# Patient Record
Sex: Female | Born: 1975
Health system: Southern US, Community
[De-identification: ages and names within clinical notes are randomized; demographics above are authoritative.]

## PROBLEM LIST (undated history)

## (undated) DIAGNOSIS — E785 Hyperlipidemia, unspecified: Secondary | ICD-10-CM

## (undated) DIAGNOSIS — K219 Gastro-esophageal reflux disease without esophagitis: Secondary | ICD-10-CM

## (undated) DIAGNOSIS — F419 Anxiety disorder, unspecified: Secondary | ICD-10-CM

## (undated) DIAGNOSIS — E7800 Pure hypercholesterolemia, unspecified: Secondary | ICD-10-CM

## (undated) DIAGNOSIS — IMO0002 Reserved for concepts with insufficient information to code with codable children: Secondary | ICD-10-CM

## (undated) DIAGNOSIS — K8020 Calculus of gallbladder without cholecystitis without obstruction: Secondary | ICD-10-CM

## (undated) DIAGNOSIS — G43109 Migraine with aura, not intractable, without status migrainosus: Secondary | ICD-10-CM

## (undated) HISTORY — DX: Calculus of gallbladder without cholecystitis without obstruction: K80.20

## (undated) HISTORY — DX: Reserved for concepts with insufficient information to code with codable children: IMO0002

## (undated) HISTORY — DX: Hyperlipidemia, unspecified: E78.5

## (undated) HISTORY — PX: BREAST SURGERY: SHX581

## (undated) HISTORY — DX: Anxiety disorder, unspecified: F41.9

## (undated) HISTORY — DX: Pure hypercholesterolemia, unspecified: E78.00

## (undated) HISTORY — DX: Migraine with aura, not intractable, without status migrainosus: G43.109

## (undated) HISTORY — DX: Gastro-esophageal reflux disease without esophagitis: K21.9

---

## 2001-12-27 ENCOUNTER — Other Ambulatory Visit: Admission: RE | Admit: 2001-12-27 | Discharge: 2001-12-27 | Payer: Self-pay | Admitting: Obstetrics and Gynecology

## 2003-01-20 ENCOUNTER — Other Ambulatory Visit: Admission: RE | Admit: 2003-01-20 | Discharge: 2003-01-20 | Payer: Self-pay | Admitting: Obstetrics and Gynecology

## 2003-02-17 DIAGNOSIS — R87619 Unspecified abnormal cytological findings in specimens from cervix uteri: Secondary | ICD-10-CM

## 2003-02-17 DIAGNOSIS — IMO0002 Reserved for concepts with insufficient information to code with codable children: Secondary | ICD-10-CM

## 2003-02-17 HISTORY — DX: Unspecified abnormal cytological findings in specimens from cervix uteri: R87.619

## 2003-02-17 HISTORY — DX: Reserved for concepts with insufficient information to code with codable children: IMO0002

## 2003-02-17 HISTORY — PX: CERVICAL BIOPSY  W/ LOOP ELECTRODE EXCISION: SUR135

## 2003-06-09 ENCOUNTER — Other Ambulatory Visit: Admission: RE | Admit: 2003-06-09 | Discharge: 2003-06-09 | Payer: Self-pay | Admitting: Obstetrics and Gynecology

## 2003-09-08 ENCOUNTER — Other Ambulatory Visit: Admission: RE | Admit: 2003-09-08 | Discharge: 2003-09-08 | Payer: Self-pay | Admitting: Obstetrics and Gynecology

## 2003-12-08 ENCOUNTER — Other Ambulatory Visit: Admission: RE | Admit: 2003-12-08 | Discharge: 2003-12-08 | Payer: Self-pay | Admitting: Obstetrics and Gynecology

## 2004-02-24 ENCOUNTER — Other Ambulatory Visit: Admission: RE | Admit: 2004-02-24 | Discharge: 2004-02-24 | Payer: Self-pay | Admitting: Obstetrics and Gynecology

## 2005-11-13 ENCOUNTER — Other Ambulatory Visit: Admission: RE | Admit: 2005-11-13 | Discharge: 2005-11-13 | Payer: Self-pay | Admitting: *Deleted

## 2007-09-19 HISTORY — PX: COSMETIC SURGERY: SHX468

## 2008-06-22 ENCOUNTER — Other Ambulatory Visit: Admission: RE | Admit: 2008-06-22 | Discharge: 2008-06-22 | Payer: Self-pay | Admitting: Obstetrics and Gynecology

## 2013-01-06 ENCOUNTER — Ambulatory Visit (INDEPENDENT_AMBULATORY_CARE_PROVIDER_SITE_OTHER): Payer: 59 | Admitting: Nurse Practitioner

## 2013-01-06 ENCOUNTER — Encounter: Payer: Self-pay | Admitting: *Deleted

## 2013-01-06 ENCOUNTER — Encounter: Payer: Self-pay | Admitting: Nurse Practitioner

## 2013-01-06 VITALS — BP 118/76 | HR 82 | Resp 16 | Ht 67.5 in | Wt 221.0 lb

## 2013-01-06 DIAGNOSIS — Z0000 Encounter for general adult medical examination without abnormal findings: Secondary | ICD-10-CM

## 2013-01-06 DIAGNOSIS — Z8742 Personal history of other diseases of the female genital tract: Secondary | ICD-10-CM

## 2013-01-06 DIAGNOSIS — Z01419 Encounter for gynecological examination (general) (routine) without abnormal findings: Secondary | ICD-10-CM

## 2013-01-06 LAB — POCT URINALYSIS DIPSTICK
Leukocytes, UA: NEGATIVE
Urobilinogen, UA: NEGATIVE

## 2013-01-06 MED ORDER — NORGESTIMATE-ETH ESTRADIOL 0.25-35 MG-MCG PO TABS: 1.0000 | ORAL_TABLET | Freq: Every day | ORAL | Status: DC

## 2013-01-06 NOTE — Patient Instructions (Addendum)

## 2013-01-06 NOTE — Progress Notes (Signed)
37 y.o.Married Caucasian Fe here for annual exam.  Menses on Ortho cyclen is regular but has more cramps. Takes OTC NSAID'S with relief. No other vaso symptoms.  Pt and husband celebrated 3 year anniversary.  They are going to [place] in October. No LMP recorded.          Sexually active: yes  The current method of family planning is OCP (estrogen/progesterone).    Exercising: yes  Home exercise routine includes walking 4 hrs per week. Smoker:  no  Health Maintenance: Pap:  01/05/2012 normal (history of CIN 2/3 2004- pap yearly X 20) MMG:  never TDaP:  2008 Labs: Hgb-      Past Medical History  Diagnosis Date  . Abnormal pap 02/2003    CIN 2-3    Past Surgical History  Procedure Laterality Date  . Cervical biopsy  w/ loop electrode excision  02/2003    CIN 2-3    Current Outpatient Prescriptions  Medication Sig Dispense Refill  . Multiple Vitamins-Minerals (MULTIVITAMIN PO) Take by mouth daily.      . Norgestimate-Ethinyl Estradiol Triphasic (ORTHO TRI-CYCLEN, 28,) 0.18/0.215/0.25 MG-35 MCG tablet Take 1 tablet by mouth daily.       No current facility-administered medications for this visit.    No family history on file.  ROS:  Pertinent items are noted in HPI.  Otherwise, a comprehensive ROS was negative.  Exam:   There were no vitals taken for this visit.     Ht Readings from Last 3 Encounters:  No data found for Ht    General appearance: alert, cooperative and appears stated age Head: Normocephalic, without obvious abnormality, atraumatic Neck: no adenopathy, supple, symmetrical, trachea midline and thyroid normal to inspection and palpation Lungs: clear to auscultation bilaterally Breasts: normal appearance, no masses or tenderness Heart: regular rate and rhythm Abdomen: soft, non-tender; no masses,  no organomegaly Extremities: extremities normal, atraumatic, no cyanosis or edema Skin: Skin color, texture, turgor normal. No rashes or lesions Lymph nodes:  Cervical, supraclavicular, and axillary nodes normal. No abnormal inguinal nodes palpated Neurologic: Grossly normal   Pelvic: External genitalia:  no lesions              Urethra:  normal appearing urethra with no masses, tenderness or lesions              Bartholin's and Skene's: normal                 Vagina: normal appearing vagina with normal color and discharge, no lesions              Cervix: anteverted              Pap taken: yes also HR HPV Bimanual Exam:  Uterus:  normal size, contour, position, consistency, mobility, non-tender              Adnexa: no mass, fullness, tenderness               Rectovaginal: Confirms               Anus:  normal sphincter tone, no lesions  A:  Well Woman with normal exam  No contraindications to continued OCP use  History of CIN 2 & 3 with LEEP 2004  P:   pap smear yearly per guidelines  RF OCP printed given to her as she has mail in Pharmacy and unsure where  to send  return annually or prn  An After Visit Summary was printed and  given to the patient.

## 2013-01-07 LAB — HEMOGLOBIN, FINGERSTICK: Hemoglobin, fingerstick: 14 g/dL (ref 12.0–16.0)

## 2013-01-07 NOTE — Progress Notes (Signed)
Encounter reviewed by Dr. Emme Rosenau Silva.  

## 2013-01-09 NOTE — Progress Notes (Signed)
Pt has aex 01/08/2014

## 2013-09-18 HISTORY — PX: CHOLECYSTECTOMY: SHX55

## 2014-01-08 ENCOUNTER — Encounter: Payer: Self-pay | Admitting: Nurse Practitioner

## 2014-01-08 ENCOUNTER — Ambulatory Visit (INDEPENDENT_AMBULATORY_CARE_PROVIDER_SITE_OTHER): Payer: 59 | Admitting: Nurse Practitioner

## 2014-01-08 VITALS — BP 130/86 | HR 72 | Temp 98.9°F | Ht 67.0 in | Wt 216.0 lb

## 2014-01-08 DIAGNOSIS — Z0000 Encounter for general adult medical examination without abnormal findings: Secondary | ICD-10-CM

## 2014-01-08 DIAGNOSIS — Z01419 Encounter for gynecological examination (general) (routine) without abnormal findings: Secondary | ICD-10-CM

## 2014-01-08 LAB — POCT URINALYSIS DIPSTICK
BILIRUBIN UA: NEGATIVE
Blood, UA: NEGATIVE
GLUCOSE UA: NEGATIVE
Ketones, UA: NEGATIVE
LEUKOCYTES UA: NEGATIVE
NITRITE UA: NEGATIVE
Protein, UA: NEGATIVE
UROBILINOGEN UA: NEGATIVE
pH, UA: 5

## 2014-01-08 LAB — HEMOGLOBIN, FINGERSTICK: HEMOGLOBIN, FINGERSTICK: 14.2 g/dL (ref 12.0–16.0)

## 2014-01-08 MED ORDER — NORGESTIMATE-ETH ESTRADIOL 0.25-35 MG-MCG PO TABS: 1.0000 | ORAL_TABLET | Freq: Every day | ORAL | Status: DC

## 2014-01-08 MED ORDER — DESOXIMETASONE 0.25 % EX CREA: TOPICAL_CREAM | CUTANEOUS | Status: DC

## 2014-01-08 NOTE — Progress Notes (Signed)
Patient ID: Susan [name], female   DOB: 28-Sep-1975, 38 y.o.   MRN: [id number] 38 y.o. G0P0 Married Caucasian Fe here for annual exam.  Menses still regular and lasting 3-4 days.  Moderate to light.  No cramps.   She and husband are still enjoying traveling.  He is from [place] and loves to go places and sample wine and fine dining.  Patient's last menstrual period was 12/24/2013.          Sexually active: yes  The current method of family planning is OCP (estrogen/progesterone).    Exercising: yes  cardio 3-4 times per week Smoker:  no  Health Maintenance: Pap:  01/06/13, WNL, neg HR HPV (history of CIN 2/3 2004- pap yearly X 20) TDaP:  05/2013 Labs:  HB: 14.2  Urine:  Negative    reports that she has never smoked. She has never used smokeless tobacco. She reports that she drinks about .6 - 1.2 ounces of alcohol per week. She reports that she does not use illicit drugs.  Past Medical History  Diagnosis Date  . Abnormal pap 02/2003    CIN 2-3    Past Surgical History  Procedure Laterality Date  . Cervical biopsy  w/ loop electrode excision  02/2003    CIN 2-3    Current Outpatient Prescriptions  Medication Sig Dispense Refill  . HYDROcodone-acetaminophen (NORCO) 7.5-325 MG per tablet Take 1 tablet by mouth as needed.      Marland Kitchen ibuprofen (ADVIL,MOTRIN) 800 MG tablet Take 1 tablet by mouth as needed.      . Multiple Vitamins-Minerals (MULTIVITAMIN PO) Take by mouth daily.      . norgestimate-ethinyl estradiol (ORTHO-CYCLEN, 28,) 0.25-35 MG-MCG tablet Take 1 tablet by mouth daily.  3 Package  3  . desoximetasone (TOPICORT) 0.25 % cream Use 1 X daily prn. But not long term  60 g  2   No current facility-administered medications for this visit.    Family History  Problem Relation Age of Onset  . Hypertension Father   . Hypertension Maternal Grandmother   . Diabetes Maternal Grandmother   . Heart failure Maternal Grandfather   . Diabetes Paternal Grandmother   . HIV Mother   .  Other Mother     ROS:  Pertinent items are noted in HPI.  Otherwise, a comprehensive ROS was negative.  Exam:   BP 130/86  Pulse 72  Temp(Src) 98.9 F (37.2 C) (Oral)  Ht 5\' 7"  (1.702 m)  Wt 216 lb (97.977 kg)  BMI 33.82 kg/m2  LMP 12/24/2013 Height: 5\' 7"  (170.2 cm)  Ht Readings from Last 3 Encounters:  01/08/14 5\' 7"  (1.702 m)  01/06/13 5' 7.5" (1.715 m)    General appearance: alert, cooperative and appears stated age Head: Normocephalic, without obvious abnormality, atraumatic Neck: no adenopathy, supple, symmetrical, trachea midline and thyroid normal to inspection and palpation Lungs: clear to auscultation bilaterally Breasts: normal appearance, no masses or tenderness Heart: regular rate and rhythm Abdomen: soft, non-tender; no masses,  no organomegaly Extremities: extremities normal, atraumatic, no cyanosis or edema Skin: Skin color, texture, turgor normal. No rashes or lesions Lymph nodes: Cervical, supraclavicular, and axillary nodes normal. No abnormal inguinal nodes palpated Neurologic: Grossly normal   Pelvic: External genitalia:  no lesions              Urethra:  normal appearing urethra with no masses, tenderness or lesions              Bartholin's and Skene's: normal  Vagina: normal appearing vagina with normal color and discharge, no lesions              Cervix: anteverted              Pap taken: yes Bimanual Exam:  Uterus:  normal size, contour, position, consistency, mobility, non-tender              Adnexa: no mass, fullness, tenderness               Rectovaginal: Confirms               Anus:  normal sphincter tone, no lesions  A:  Well Woman with normal exam  Contraception with OCP  History of CIN II & III 02/2003  P:   Reviewed health and wellness pertinent to exam  Pap smear taken today and yearly for 20 years  Mammogram screening  Refill OCP for a year  Also refill of Topicort for eczema of elbow  Counseled on breast self exam,  mammography screening, use and side effects of OCP's, adequate intake of calcium and vitamin D, diet and exercise return annually or prn  An After Visit Summary was printed and given to the patient.

## 2014-01-08 NOTE — Patient Instructions (Signed)

## 2014-01-11 NOTE — Progress Notes (Signed)
Encounter reviewed by Dr. Brook Silva.  

## 2014-01-13 LAB — IPS PAP TEST WITH HPV

## 2014-07-03 ENCOUNTER — Other Ambulatory Visit: Payer: Self-pay

## 2014-11-06 ENCOUNTER — Other Ambulatory Visit: Payer: Self-pay | Admitting: Nurse Practitioner

## 2014-11-06 NOTE — Telephone Encounter (Signed)
Medication refill request: Ortho-Cyclen Last AEX:  01/08/14 with PG  Next AEX: 01/11/15 with PG Last MMG (if hormonal medication request): N/A Refill authorized: #[id number] rfs, please advise.

## 2015-01-11 ENCOUNTER — Ambulatory Visit: Payer: 59 | Admitting: Nurse Practitioner

## 2016-02-24 DIAGNOSIS — Z01419 Encounter for gynecological examination (general) (routine) without abnormal findings: Secondary | ICD-10-CM | POA: Diagnosis not present

## 2016-02-24 DIAGNOSIS — E7800 Pure hypercholesterolemia, unspecified: Secondary | ICD-10-CM | POA: Diagnosis not present

## 2016-02-24 DIAGNOSIS — R030 Elevated blood-pressure reading, without diagnosis of hypertension: Secondary | ICD-10-CM | POA: Insufficient documentation

## 2016-02-24 DIAGNOSIS — Z1231 Encounter for screening mammogram for malignant neoplasm of breast: Secondary | ICD-10-CM | POA: Diagnosis not present

## 2016-02-24 DIAGNOSIS — E669 Obesity, unspecified: Secondary | ICD-10-CM | POA: Diagnosis not present

## 2016-02-24 DIAGNOSIS — Z87410 Personal history of cervical dysplasia: Secondary | ICD-10-CM | POA: Insufficient documentation

## 2016-02-24 DIAGNOSIS — R7303 Prediabetes: Secondary | ICD-10-CM | POA: Diagnosis not present

## 2017-04-25 ENCOUNTER — Other Ambulatory Visit: Payer: Self-pay | Admitting: Obstetrics and Gynecology

## 2017-04-25 DIAGNOSIS — Z1231 Encounter for screening mammogram for malignant neoplasm of breast: Secondary | ICD-10-CM

## 2017-05-28 ENCOUNTER — Encounter: Payer: Self-pay | Admitting: Obstetrics and Gynecology

## 2017-05-28 ENCOUNTER — Ambulatory Visit: Payer: Self-pay

## 2017-06-11 ENCOUNTER — Encounter: Payer: Self-pay | Admitting: Obstetrics and Gynecology

## 2017-06-11 ENCOUNTER — Ambulatory Visit
Admission: RE | Admit: 2017-06-11 | Discharge: 2017-06-11 | Disposition: A | Discharge status: Home / Self Care | Payer: BLUE CROSS/BLUE SHIELD | Source: Ambulatory Visit | Attending: Obstetrics and Gynecology | Admitting: Obstetrics and Gynecology

## 2017-06-11 ENCOUNTER — Ambulatory Visit (INDEPENDENT_AMBULATORY_CARE_PROVIDER_SITE_OTHER): Payer: BLUE CROSS/BLUE SHIELD | Admitting: Obstetrics and Gynecology

## 2017-06-11 ENCOUNTER — Other Ambulatory Visit (HOSPITAL_COMMUNITY)
Admission: RE | Admit: 2017-06-11 | Discharge: 2017-06-11 | Disposition: A | Discharge status: Home / Self Care | Payer: BLUE CROSS/BLUE SHIELD | Source: Ambulatory Visit | Attending: Obstetrics and Gynecology | Admitting: Obstetrics and Gynecology

## 2017-06-11 VITALS — BP 118/70 | HR 76 | Resp 16 | Ht 67.0 in | Wt 212.0 lb

## 2017-06-11 DIAGNOSIS — Z3009 Encounter for other general counseling and advice on contraception: Secondary | ICD-10-CM | POA: Diagnosis not present

## 2017-06-11 DIAGNOSIS — Z124 Encounter for screening for malignant neoplasm of cervix: Secondary | ICD-10-CM

## 2017-06-11 DIAGNOSIS — Z1231 Encounter for screening mammogram for malignant neoplasm of breast: Secondary | ICD-10-CM

## 2017-06-11 DIAGNOSIS — Z01419 Encounter for gynecological examination (general) (routine) without abnormal findings: Secondary | ICD-10-CM

## 2017-06-11 MED ORDER — DESOXIMETASONE 0.25 % EX CREA: TOPICAL_CREAM | CUTANEOUS | 2 refills | Status: DC

## 2017-06-11 NOTE — Patient Instructions (Signed)
EXERCISE AND DIET:  We recommended that you start or continue a regular exercise program for good health. Regular exercise means any activity that makes your heart beat faster and makes you sweat.  We recommend exercising at least 30 minutes per day at least 3 days a week, preferably 4 or 5.  We also recommend a diet low in fat and sugar.  Inactivity, poor dietary choices and obesity can cause diabetes, heart attack, stroke, and kidney damage, among others.    ALCOHOL AND SMOKING:  Women should limit their alcohol intake to no more than 7 drinks/beers/glasses of wine (combined, not each!) per week. Moderation of alcohol intake to this level decreases your risk of breast cancer and liver damage. And of course, no recreational drugs are part of a healthy lifestyle.  And absolutely no smoking or even second hand smoke. Most people know smoking can cause heart and lung diseases, but did you know it also contributes to weakening of your bones? Aging of your skin?  Yellowing of your teeth and nails?  CALCIUM AND VITAMIN D:  Adequate intake of calcium and Vitamin D are recommended.  The recommendations for exact amounts of these supplements seem to change often, but generally speaking 600 mg of calcium (either carbonate or citrate) and 800 units of Vitamin D per day seems prudent. Certain women may benefit from higher intake of Vitamin D.  If you are among these women, your doctor will have told you during your visit.    PAP SMEARS:  Pap smears, to check for cervical cancer or precancers,  have traditionally been done yearly, although recent scientific advances have shown that most women can have pap smears less often.  However, every woman still should have a physical exam from her gynecologist every year. It will include a breast check, inspection of the vulva and vagina to check for abnormal growths or skin changes, a visual exam of the cervix, and then an exam to evaluate the size and shape of the uterus and  ovaries.  And after 42 years of age, a rectal exam is indicated to check for rectal cancers. We will also provide age appropriate advice regarding health maintenance, like when you should have certain vaccines, screening for sexually transmitted diseases, bone density testing, colonoscopy, mammograms, etc.   MAMMOGRAMS:  All women over 42 years old should have a yearly mammogram. Many facilities now offer a "3D" mammogram, which may cost around $50 extra out of pocket. If possible,  we recommend you accept the option to have the 3D mammogram performed.  It both reduces the number of women who will be called back for extra views which then turn out to be normal, and it is better than the routine mammogram at detecting truly abnormal areas.    COLONOSCOPY:  Colonoscopy to screen for colon cancer is recommended for all women at age 42.  We know, you hate the idea of the prep.  We agree, BUT, having colon cancer and not knowing it is worse!!  Colon cancer so often starts as a polyp that can be seen and removed at colonscopy, which can quite literally save your life!  And if your first colonoscopy is normal and you have no family history of colon cancer, most women don't have to have it again for 10 years.  Once every ten years, you can do something that may end up saving your life, right?  We will be happy to help you get it scheduled when you are ready.    Be sure to check your insurance coverage so you understand how much it will cost.  It may be covered as a preventative service at no cost, but you should check your particular policy.      Breast Self-Awareness Breast self-awareness means being familiar with how your breasts look and feel. It involves checking your breasts regularly and reporting any changes to your health care provider. Practicing breast self-awareness is important. A change in your breasts can be a sign of a serious medical problem. Being familiar with how your breasts look and feel allows  you to find any problems early, when treatment is more likely to be successful. All women should practice breast self-awareness, including women who have had breast implants. How to do a breast self-exam One way to learn what is normal for your breasts and whether your breasts are changing is to do a breast self-exam. To do a breast self-exam: Look for Changes  1. Remove all the clothing above your waist. 2. Stand in front of a mirror in a room with good lighting. 3. Put your hands on your hips. 4. Push your hands firmly downward. 5. Compare your breasts in the mirror. Look for differences between them (asymmetry), such as: ? Differences in shape. ? Differences in size. ? Puckers, dips, and bumps in one breast and not the other. 6. Look at each breast for changes in your skin, such as: ? Redness. ? Scaly areas. 7. Look for changes in your nipples, such as: ? Discharge. ? Bleeding. ? Dimpling. ? Redness. ? A change in position. Feel for Changes  Carefully feel your breasts for lumps and changes. It is best to do this while lying on your back on the floor and again while sitting or standing in the shower or tub with soapy water on your skin. Feel each breast in the following way:  Place the arm on the side of the breast you are examining above your head.  Feel your breast with the other hand.  Start in the nipple area and make  inch (2 cm) overlapping circles to feel your breast. Use the pads of your three middle fingers to do this. Apply light pressure, then medium pressure, then firm pressure. The light pressure will allow you to feel the tissue closest to the skin. The medium pressure will allow you to feel the tissue that is a little deeper. The firm pressure will allow you to feel the tissue close to the ribs.  Continue the overlapping circles, moving downward over the breast until you feel your ribs below your breast.  Move one finger-width toward the center of the body.  Continue to use the  inch (2 cm) overlapping circles to feel your breast as you move slowly up toward your collarbone.  Continue the up and down exam using all three pressures until you reach your armpit.  Write Down What You Find  Write down what is normal for each breast and any changes that you find. Keep a written record with breast changes or normal findings for each breast. By writing this information down, you do not need to depend only on memory for size, tenderness, or location. Write down where you are in your menstrual cycle, if you are still menstruating. If you are having trouble noticing differences in your breasts, do not get discouraged. With time you will become more familiar with the variations in your breasts and more comfortable with the exam. How often should I examine my breasts? Examine   your breasts every month. If you are breastfeeding, the best time to examine your breasts is after a feeding or after using a breast pump. If you menstruate, the best time to examine your breasts is 5-7 days after your period is over. During your period, your breasts are lumpier, and it may be more difficult to notice changes. When should I see my health care provider? See your health care provider if you notice:  A change in shape or size of your breasts or nipples.  A change in the skin of your breast or nipples, such as a reddened or scaly area.  Unusual discharge from your nipples.  A lump or thick area that was not there before.  Pain in your breasts.  Anything that concerns you.  This information is not intended to replace advice given to you by your health care provider. Make sure you discuss any questions you have with your health care provider. Document Released: 09/04/2005 Document Revised: 02/10/2016 Document Reviewed: 07/25/2015 Elsevier Interactive Patient Education  2018 Elsevier Inc.  

## 2017-06-11 NOTE — Progress Notes (Signed)
41 y.o. G0P0 MarriedCaucasianF here for annual exam.  She ran out of OCP's in August. Her first cycle off the pill was okay. She reports a h/o migraines with Aura. In the past she had bad cramps, this first cycle without OCP's was mildly crampy. Sexually active without pain.  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 [place]  Period Cycle (Days): 21 Period Duration (Days): 7 days Period Pattern: Regular Menstrual Flow: Light Menstrual Control: Tampon Menstrual Control Change Freq (Hours): changes tampon 2-3 times a day  Dysmenorrhea: None  Patient's last menstrual period was 06/03/2017.          Sexually active: Yes.    The current method of family planning is none.    Exercising: Yes.    walking Smoker:  no  Health Maintenance: Pap:  01-08-14 WNL NEG HR HPV History of abnormal Pap:  Yes- HPV- LEEP 2004 CIN 2-3 MMG:  06-11-17 -waiting for results Colonoscopy:  Never BMD:   Never TDaP:  Had one at work about 4 years ago.   Gardasil: N/A   reports that she has never smoked. She has never used smokeless tobacco. She reports that she drinks about 0.6 - 1.2 oz of alcohol per week . She reports that she does not use drugs. 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 Husband is from [place], moved to the [place] as a child.   Past Medical History:  Diagnosis Date  . Abnormal pap 02/2003   CIN 2-3  . Migraine with aura     Past Surgical History:  Procedure Laterality Date  . CERVICAL BIOPSY  W/ LOOP ELECTRODE EXCISION  02/2003   CIN 2-3  . CHOLECYSTECTOMY  2015    Current Outpatient Prescriptions  Medication Sig Dispense Refill  . desoximetasone (TOPICORT) 0.25 % cream Use 1 X daily prn. But not long term 30 g 2  . ibuprofen (ADVIL,MOTRIN) 800 MG tablet Take 1 tablet by mouth as needed.    . Multiple Vitamins-Minerals (MULTIVITAMIN PO) Take by mouth daily.     No current facility-administered medications for this visit.     Family History  Problem  Relation Age of Onset  . Hypertension Father   . Hypertension Maternal Grandmother   . Diabetes Maternal Grandmother   . Heart failure Maternal Grandfather   . Diabetes Paternal Grandmother   . HIV Mother   . Other Mother   Mom died when she was 73. Brother died in Feb 19, 2044, thinks accidental OD.   Review of Systems  Constitutional: Negative.   HENT: Negative.   Eyes: Negative.   Respiratory: Negative.   Cardiovascular: Negative.   Gastrointestinal: Negative.   Endocrine: Negative.   Genitourinary: Negative.   Musculoskeletal: Negative.   Skin: Negative.   Allergic/Immunologic: Negative.   Neurological: Negative.   Psychiatric/Behavioral: Negative.     Exam:   BP 118/70 (BP Location: Right Arm, Patient Position: Sitting, Cuff Size: Normal)   Pulse 76   Resp 16   Ht 5\' 7"  (1.702 m)   Wt 212 lb (96.2 kg)   LMP 06/03/2017   BMI 33.20 kg/m   Weight change: @WEIGHTCHANGE @ Height:   Height: 5\' 7"  (170.2 cm)  Ht Readings from Last 3 Encounters:  06/11/17 5\' 7"  (1.702 m)  01/08/14 5\' 7"  (1.702 m)  01/06/13 5' 7.5" (1.715 m)    General appearance: alert, cooperative and appears stated age Head: Normocephalic, without obvious abnormality, atraumatic Neck: no adenopathy, supple, symmetrical,  trachea midline and thyroid normal to inspection and palpation Lungs: clear to auscultation bilaterally Cardiovascular: regular rate and rhythm Breasts: normal appearance, no masses or tenderness Abdomen: soft, non-tender; non distended,  no masses,  no organomegaly Extremities: extremities normal, atraumatic, no cyanosis or edema Skin: Skin color, texture, turgor normal. No rashes or lesions Lymph nodes: Cervical, supraclavicular, and axillary nodes normal. No abnormal inguinal nodes palpated Neurologic: Grossly normal   Pelvic: External genitalia:  no lesions              Urethra:  normal appearing urethra with no masses, tenderness or lesions              Bartholins and Skenes:  normal                 Vagina: normal appearing vagina with normal color and discharge, no lesions              Cervix: no lesions               Bimanual Exam:  Uterus:  normal size, contour, position, consistency, mobility, non-tender and retroverted              Adnexa: no mass, fullness, tenderness               Rectovaginal: Confirms               Anus:  normal sphincter tone, no lesions  Chaperone was present for exam.  A:  Well Woman with normal exam  P:   Pap with hpv  Screening labs at work  Mammogram just done  Discussed breast self exam  Discussed calcium and vit D intake  Discussed option of contraception, information on Mirena given (can't take OCP'S)

## 2017-06-13 LAB — CYTOLOGY - PAP
Diagnosis: NEGATIVE
HPV: NOT DETECTED

## 2017-06-15 LAB — LIPID PANEL
CHOLESTEROL: 258 — AB (ref 0–200)
HDL: 47 (ref 35–70)
LDL Cholesterol: 43
TRIGLYCERIDES: 214 — AB (ref 40–160)

## 2017-06-15 LAB — BASIC METABOLIC PANEL
BUN: 11 (ref 4–21)
Creatinine: 0.9 (ref 0.5–1.1)
POTASSIUM: 4.5 (ref 3.4–5.3)
Sodium: 142 (ref 137–147)

## 2017-06-15 LAB — HEMOGLOBIN A1C: Hemoglobin A1C: 5.6

## 2017-07-23 ENCOUNTER — Telehealth: Payer: Self-pay

## 2017-07-23 ENCOUNTER — Encounter: Payer: Self-pay | Admitting: Obstetrics and Gynecology

## 2017-07-23 DIAGNOSIS — J018 Other acute sinusitis: Secondary | ICD-10-CM | POA: Diagnosis not present

## 2017-07-23 NOTE — Telephone Encounter (Signed)
Visit Follow-Up Question  Message [id number]  From Alliana, Mcauliff To Salvadore Dom, MD Sent 07/23/2017 10:03 AM  Dr. Talbert Nan asked that I send my results from my company's clinical health assessment (wellness program). The nurse that provided my results was a little concerned about my cholesterol. I have attached the results. I do not have a primary doctor. Do I need to find a primary or can Dr. Talbert Nan assist?  Thank you!   Attachments   J.Lyles [facility] 2018  Responsible Party   Pool - [facility] Clinical Pool No one has taken responsibility for this message.  No actions have been taken on this message.   Results printed and to [place] for review and advise.

## 2017-07-23 NOTE — Telephone Encounter (Signed)
Please see telephone encounter dated with today's date. 

## 2017-07-25 NOTE — Telephone Encounter (Signed)
I've reviewed her labs. Her total cholesterol, triglycerides, her LDL and her ALT (liver function) were all mildly elevated. I would recommend that she establish care with a primary. Please give her suggestions if needed.  I would recommend she eat a mediterranean diet.  A mediterranean diet is high in fruits, vegetables, whole grains, fish, chicken, nuts, healthy fats (olive oil or canola oil). Low fat dairy. Limit butter, margarine, red meat and sweets.  She should exercise regularly. Weight loss would help her labs normalize.

## 2017-07-26 NOTE — Telephone Encounter (Signed)
Spoke with patient. Advised of message as seen below from [place]. Patient verbalizes understanding. Contact information given to Dr.Wallace's office. Patient will call and schedule an appointment to establish care.  [place] [facility] [place], [place]  [place]: [phone]  Routing to provider for final review. Patient agreeable to disposition. Will close encounter.

## 2017-08-17 ENCOUNTER — Other Ambulatory Visit: Payer: Self-pay

## 2017-08-17 ENCOUNTER — Encounter: Payer: Self-pay | Admitting: Family Medicine

## 2017-08-17 ENCOUNTER — Ambulatory Visit (INDEPENDENT_AMBULATORY_CARE_PROVIDER_SITE_OTHER): Payer: BLUE CROSS/BLUE SHIELD | Admitting: Family Medicine

## 2017-08-17 VITALS — BP 122/84 | HR 83 | Temp 98.1°F | Ht 67.0 in | Wt 218.0 lb

## 2017-08-17 DIAGNOSIS — E782 Mixed hyperlipidemia: Secondary | ICD-10-CM

## 2017-08-17 DIAGNOSIS — E669 Obesity, unspecified: Secondary | ICD-10-CM

## 2017-08-17 DIAGNOSIS — R635 Abnormal weight gain: Secondary | ICD-10-CM | POA: Diagnosis not present

## 2017-08-17 MED ORDER — PHENTERMINE HCL 37.5 MG PO TABS: 37.5000 mg | ORAL_TABLET | Freq: Every day | ORAL | 2 refills | Status: DC

## 2017-08-17 NOTE — Progress Notes (Signed)
Susan [name] is a 41 y.o. female is here to Susan [name].   Patient Care Team: Susan Deutscher, DO as PCP - General (Family Medicine)   History of Present Illness:   HPI: See Assessment and Plan section for Problem Based Charting of issues discussed today.  There are no preventive care reminders to display for this patient. Depression screen PHQ 2/9 08/17/2017  Decreased Interest 0  Down, Depressed, Hopeless 0  PHQ - 2 Score 0   PMHx, SurgHx, SocialHx, Medications, and Allergies were reviewed in the Visit Navigator and updated as appropriate.   Past Medical History:  Diagnosis Date  . Abnormal pap 02/2003   CIN 2-3  . GERD (gastroesophageal reflux disease)   . Migraine with aura    Past Surgical History:  Procedure Laterality Date  . CERVICAL BIOPSY  W/ LOOP ELECTRODE EXCISION  02/2003   CIN 2-3  . CHOLECYSTECTOMY  2015  . COSMETIC SURGERY  2009   Family History  Problem Relation Age of Onset  . Hypertension Father   . Depression Father        Suicide  . Hypertension Maternal Grandmother   . Diabetes Maternal Grandmother   . Stroke Maternal Grandmother   . Heart failure Maternal Grandfather   . Diabetes Paternal Grandmother   . HIV Mother   . Other Mother   . Early death Mother        Age 94  . Depression Paternal Grandfather        Suicide  . Early death Brother        Age 77   Social History   Tobacco Use  . Smoking status: Never Smoker  . Smokeless tobacco: Never Used  Substance Use Topics  . Alcohol use: Yes    Alcohol/week: 0.6 - 1.2 oz    Types: 1 - 2 Glasses of wine per week    Comment: 1-2 glasses/week  . Drug use: No    Current Medications and Allergies:   .  desoximetasone (TOPICORT) 0.25 % cream, Use 1 X daily prn. But not long term, Disp: 30 g, Rfl: 2 .  ibuprofen (ADVIL,MOTRIN) 800 MG tablet, Take 1 tablet by mouth as needed., Disp: , Rfl:  .  Multiple Vitamins-Minerals (MULTIVITAMIN PO), Take by mouth daily., Disp: , Rfl:    No Known Allergies   Review of Systems:   Pertinent items are noted in the HPI. Otherwise, ROS is negative.  Vitals:   Vitals:   08/17/17 1420  BP: 122/84  Pulse: 83  Temp: 98.1 F (36.7 C)  TempSrc: Oral  SpO2: 98%  Weight: 218 lb (98.9 kg)  Height: 5\' 7"  (1.702 m)     Body mass index is 34.14 kg/m.   Physical Exam:   Physical Exam  Constitutional: She is oriented to person, place, and time. She appears well-developed and well-nourished. No distress.  HENT:  Head: Normocephalic and atraumatic.  Right Ear: External ear normal.  Left Ear: External ear normal.  Nose: Nose normal.  Mouth/Throat: Oropharynx is clear and moist.  Eyes: Conjunctivae and EOM are normal. Pupils are equal, round, and reactive to light.  Neck: Normal range of motion. Neck supple. No thyromegaly present.  Cardiovascular: Normal rate, regular rhythm, normal heart sounds and intact distal pulses.  Pulmonary/Chest: Effort normal and breath sounds normal.  Abdominal: Soft. Bowel sounds are normal.  Musculoskeletal: Normal range of motion.  Lymphadenopathy:    She has no cervical adenopathy.  Neurological: She is alert and oriented  to person, place, and time.  Skin: Skin is warm and dry. Capillary refill takes less than 2 seconds.  Psychiatric: She has a normal mood and affect. Her behavior is normal.  Nursing note and vitals reviewed.  Results for orders placed or performed in visit on 29/92/42  Basic metabolic panel  Result Value Ref Range   BUN 11 4 - 21   Creatinine 0.9 0.5 - 1.1   Potassium 4.5 3.4 - 5.3   Sodium 142 137 - 147  Lipid panel  Result Value Ref Range   Triglycerides 214 (A) 40 - 160   Cholesterol 258 (A) 0 - 200   HDL 47 35 - 70   LDL Cholesterol 43   Hemoglobin A1c  Result Value Ref Range   Hemoglobin A1C 5.6    Assessment and Plan:   Susan [name] was seen today for establish care.  Diagnoses and all orders for this visit:  Weight gain Comments: Signs of  hypothyroidism: none. Signs of hypercortisolism: none. Contraindications to weight loss: none. Patient readiness to commit to diet and activity changes: excellent. Barriers to weight loss: time limitations.  Plan: 1. Diagnostic studies to rule out secondary causes of obesity: see below. 2. General patient education:   Average sustained weight loss in long-term studies w/lifestyle interventions alone is 10-15 lb.  Importance of long-term maintenance tx in weight loss.  Use non-food self-rewards to reinforce behavior changes.  Elicit support from others; identify saboteurs.  Practical target weight is usually around 2 BMI units below current weight. 3. Diet interventions:   Risks of dieting were reviewed, including fatigue, temporary hair loss, gallstone formation, gout, and with very low calorie diets, electrolyte abnormalities, nutrient inadequacies, and loss of lean body mass. 4. Exercise intervention:   Informal measures, e.g. taking stairs instead of elevator.  Formal exercise regimen options. 5. Other behavioral treatment: stress management. 6. Other treatment: Medication: see below. 7. Patient to keep a weight log that we will review at follow up. 8. Follow up: 3 months and as needed.  Orders: -     phentermine (ADIPEX-P) 37.5 MG tablet; Take 1 tablet (37.5 mg total) by mouth daily before breakfast.  Mixed hyperlipidemia Comments: Will recheck in 3-6 months with weight loss.   Obesity (BMI 30-39.9) Comments: As above.    Records requested if needed. Time spent with the patient: 30 minutes, of which >50% was spent in obtaining information about her symptoms, reviewing her previous labs, evaluations, and treatments, counseling her about her condition (please see the discussed topics above), and developing a plan to further investigate it; she had a number of questions which I addressed.   . Reviewed expectations re: course of current medical issues. . Discussed  self-management of symptoms. . Outlined signs and symptoms indicating need for more acute intervention. . Patient verbalized understanding and all questions were answered. Marland Kitchen Health Maintenance issues including appropriate healthy diet, exercise, and smoking avoidance were discussed with patient. . See orders for this visit as documented in the electronic medical record. . Patient received an After Visit Summary.  Susan Deutscher, DO [place] 08/18/2017  Future Appointments  Date Time Provider [place]  11/16/2017  3:00 PM Susan Deutscher, DO [facility]  06/13/2018  2:30 PM Salvadore Dom, MD [place] None

## 2017-08-18 ENCOUNTER — Encounter: Payer: Self-pay | Admitting: Family Medicine

## 2017-08-18 DIAGNOSIS — E7800 Pure hypercholesterolemia, unspecified: Secondary | ICD-10-CM | POA: Insufficient documentation

## 2017-11-16 ENCOUNTER — Encounter: Payer: Self-pay | Admitting: Family Medicine

## 2017-11-16 ENCOUNTER — Ambulatory Visit (INDEPENDENT_AMBULATORY_CARE_PROVIDER_SITE_OTHER): Payer: BLUE CROSS/BLUE SHIELD | Admitting: Family Medicine

## 2017-11-16 VITALS — BP 120/82 | HR 90 | Temp 98.0°F | Wt 211.8 lb

## 2017-11-16 DIAGNOSIS — E782 Mixed hyperlipidemia: Secondary | ICD-10-CM | POA: Diagnosis not present

## 2017-11-16 DIAGNOSIS — E6609 Other obesity due to excess calories: Secondary | ICD-10-CM

## 2017-11-16 DIAGNOSIS — Z6833 Body mass index (BMI) 33.0-33.9, adult: Secondary | ICD-10-CM

## 2017-11-16 MED ORDER — PHENTERMINE HCL 37.5 MG PO TABS: ORAL_TABLET | ORAL | 2 refills | Status: DC

## 2017-11-16 MED ORDER — DESOXIMETASONE 0.25 % EX CREA: TOPICAL_CREAM | CUTANEOUS | 2 refills | Status: DC

## 2017-11-16 NOTE — Progress Notes (Signed)
Susan [name] is a 42 y.o. female is here for follow up.  History of Present Illness:   Lonell Grandchild, CMA acting as scribe for Dr. Briscoe Deutscher.   HPI: Patient in today for follow up on Phentermine. She feels like there has been improvement but feels like she did not have as much improvement with it over the holidays.  She has lost over 7 pounds.  Patient states that today when she weighed herself at home she was 209.  She is excited about trying to get under 200 pounds.  When asked about nighttime eating, she does admit to sometimes snacking later at night.  Review of Systems  Constitutional: Negative for chills and fever.  HENT: Negative for hearing loss.   Eyes: Negative for blurred vision.  Respiratory: Negative for cough.   Cardiovascular: Negative for chest pain and palpitations.  Gastrointestinal: Negative for heartburn, nausea and vomiting.  Genitourinary: Negative for dysuria and urgency.  Musculoskeletal: Negative for myalgias.  Neurological: Negative for dizziness and headaches.  Endo/Heme/Allergies: Does not bruise/bleed easily.   There are no preventive care reminders to display for this patient.   Depression screen PHQ 2/9 08/17/2017  Decreased Interest 0  Down, Depressed, Hopeless 0  PHQ - 2 Score 0   PMHx, SurgHx, SocialHx, FamHx, Medications, and Allergies were reviewed in the Visit Navigator and updated as appropriate.   Patient Active Problem List   Diagnosis Date Noted  . Hypercholesterolemia 08/18/2017  . Obesity (BMI 30-39.9) 02/24/2016  . Prediabetes 02/24/2016  . Prehypertension 02/24/2016  . H/O abnormal cervical Papanicolaou smear 01/06/2013   Social History   Tobacco Use  . Smoking status: Never Smoker  . Smokeless tobacco: Never Used  Substance Use Topics  . Alcohol use: Yes    Alcohol/week: 0.6 - 1.2 oz    Types: 1 - 2 Glasses of wine per week    Comment: 1-2 glasses/week  . Drug use: No   Current Medications and Allergies:    .  desoximetasone (TOPICORT) 0.25 % cream, Use 1 X daily prn. But not long term, Disp: 30 g, Rfl: 2 .  ibuprofen (ADVIL,MOTRIN) 800 MG tablet, Take 1 tablet by mouth as needed., Disp: , Rfl:  .  Multiple Vitamins-Minerals (MULTIVITAMIN PO), Take by mouth daily., Disp: , Rfl:  .  phentermine (ADIPEX-P) 37.5 MG tablet, One po in the morning, Disp: 30 tablet, Rfl: 2  No Known Allergies   Review of Systems   Pertinent items are noted in the HPI. Otherwise, ROS is negative.  Vitals:   Vitals:   11/16/17 1505  BP: 120/82  Pulse: 90  Temp: 98 F (36.7 C)  TempSrc: Oral  SpO2: 95%  Weight: 211 lb 12.8 oz (96.1 kg)     Body mass index is 33.17 kg/m.  Physical Exam:   Physical Exam  Constitutional: She is oriented to person, place, and time. She appears well-developed and well-nourished. No distress.  HENT:  Head: Normocephalic and atraumatic.  Right Ear: External ear normal.  Left Ear: External ear normal.  Nose: Nose normal.  Mouth/Throat: Oropharynx is clear and moist.  Eyes: Conjunctivae and EOM are normal. Pupils are equal, round, and reactive to light.  Neck: Normal range of motion. Neck supple. No thyromegaly present.  Cardiovascular: Normal rate, regular rhythm, normal heart sounds and intact distal pulses.  Pulmonary/Chest: Effort normal and breath sounds normal.  Abdominal: Soft. Bowel sounds are normal.  Musculoskeletal: Normal range of motion.  Lymphadenopathy:    She has  no cervical adenopathy.  Neurological: She is alert and oriented to person, place, and time.  Skin: Skin is warm and dry. Capillary refill takes less than 2 seconds.  Psychiatric: She has a normal mood and affect. Her behavior is normal.  Nursing note and vitals reviewed.   Results for orders placed or performed in visit on 56/21/30  Basic metabolic panel  Result Value Ref Range   BUN 11 4 - 21   Creatinine 0.9 0.5 - 1.1   Potassium 4.5 3.4 - 5.3   Sodium 142 137 - 147  Lipid panel    Result Value Ref Range   Triglycerides 214 (A) 40 - 160   Cholesterol 258 (A) 0 - 200   HDL 47 35 - 70   LDL Cholesterol 43   Hemoglobin A1c  Result Value Ref Range   Hemoglobin A1C 5.6    Assessment and Plan:   The 10-year ASCVD risk score Mikey Bussing DC Jr., et al., 2013) is: 1.2%*   Values used to calculate the score:     Age: 16 years     Sex: Female     Is Non-Hispanic African American: No     Diabetic: No     Tobacco smoker: No     Systolic Blood Pressure: 865 mmHg     Is BP treated: No     HDL Cholesterol: 47 mg/dL*     Total Cholesterol: 258 mg/dL*     * - Cholesterol units were assumed for this score calculation  1. Class 1 obesity due to excess calories without serious comorbidity with body mass index (BMI) of 33.0 to 33.9 in adult Patient is actually doing very well.  We did review healthy food choices, mindful eating, and exercising regularly.  I will increase her phentermine to allow her to have a half dose in the afternoon if needed.  Recheck in 3 months.  We can recheck labs at that time as well.  - phentermine (ADIPEX-P) 37.5 MG tablet; One po in the morning and 1/2 in the afternoon.  Dispense: 45 tablet; Refill: 2  2. Mixed hyperlipidemia Triglycerides elevated.  I suspect that this will be much improved on her next lipid check.  Her ASCVD risk is low.   . Reviewed expectations re: course of current medical issues. . Discussed self-management of symptoms. . Outlined signs and symptoms indicating need for more acute intervention. . Patient verbalized understanding and all questions were answered. Marland Kitchen Health Maintenance issues including appropriate healthy diet, exercise, and smoking avoidance were discussed with patient. . See orders for this visit as documented in the electronic medical record. . Patient received an After Visit Summary.  CMA served as Education administrator during this visit. History, Physical, and Plan performed by medical provider. The above documentation has  been reviewed and is accurate and complete. Briscoe Deutscher, D.O.  Briscoe Deutscher, DO [place] 11/17/2017

## 2017-11-17 ENCOUNTER — Encounter: Payer: Self-pay | Admitting: Family Medicine

## 2017-11-17 DIAGNOSIS — E6609 Other obesity due to excess calories: Secondary | ICD-10-CM | POA: Insufficient documentation

## 2017-11-17 DIAGNOSIS — Z6833 Body mass index (BMI) 33.0-33.9, adult: Secondary | ICD-10-CM

## 2018-01-16 DIAGNOSIS — M7150 Other bursitis, not elsewhere classified, unspecified site: Secondary | ICD-10-CM | POA: Diagnosis not present

## 2018-02-06 ENCOUNTER — Ambulatory Visit (INDEPENDENT_AMBULATORY_CARE_PROVIDER_SITE_OTHER): Payer: BLUE CROSS/BLUE SHIELD | Admitting: Sports Medicine

## 2018-02-06 ENCOUNTER — Ambulatory Visit: Payer: Self-pay

## 2018-02-06 ENCOUNTER — Encounter: Payer: Self-pay | Admitting: Sports Medicine

## 2018-02-06 ENCOUNTER — Ambulatory Visit (INDEPENDENT_AMBULATORY_CARE_PROVIDER_SITE_OTHER): Payer: BLUE CROSS/BLUE SHIELD

## 2018-02-06 VITALS — BP 122/88 | HR 85 | Ht 67.0 in | Wt 204.2 lb

## 2018-02-06 DIAGNOSIS — M25522 Pain in left elbow: Secondary | ICD-10-CM

## 2018-02-06 DIAGNOSIS — M25422 Effusion, left elbow: Secondary | ICD-10-CM

## 2018-02-06 DIAGNOSIS — M7989 Other specified soft tissue disorders: Secondary | ICD-10-CM | POA: Diagnosis not present

## 2018-02-06 MED ORDER — METHYLPREDNISOLONE 4 MG PO TBPK: ORAL_TABLET | ORAL | 0 refills | Status: DC

## 2018-02-06 NOTE — Procedures (Signed)
LIMITED MSK ULTRASOUND OF Left elbow Images were obtained and interpreted by myself, Teresa Coombs, DO  Images have been saved and stored to PACS system. Images obtained on: GE S7 Ultrasound machine  FINDINGS:   Subcutaneous stranding of the tissue with heterogeneous tissue texture of the subcutaneous tissue.  There does appear to be a small olecranon bursa as well as small joint effusion.  Moderate generalized synovitis with increased neovascularity.  Possible disruption of the triceps fibers along the far lateral origin but this is incompletely appreciated due to the heterogeneous nature of the subcutaneous tissue.  IMPRESSION:  1. Left olecranon bursitis with possible partial tear of the triceps

## 2018-02-06 NOTE — Progress Notes (Signed)
Susan [name]. Susan [name] Sports Medicine [facility] [phone]  Susan [name] - 42 y.o. female MRN [id number]  Date of birth: August 02, 1976  Visit Date: 02/06/2018  PCP: Helane Rima, DO   Referred by: Helane Rima, DO  Scribe for today's visit: Christoper Fabian, LAT, ATC     SUBJECTIVE:  Susan [name] is here for New Patient (Initial Visit) (L elbow pain)  Her L elbow pain symptoms INITIALLY: Began 01/14/18 after mulching her yard.  Her L elbow was hot and swollen on Monday morning and went to the [facility] and was diagnosed w/ bursitis.  She wore some compression and has been alternating between Aspirin and Aleve. Described as mod-severe throbbing and stabbing pain, nonradiating Worsened with elbow movement and use Improved with Aleve and Aspirin Additional associated symptoms include: warmth and redness noted at the L elbow; some N/T noted in the L hand    At this time symptoms are worsening compared to onset w/ increased pain intensity She has been using compression intermittently, icing and using Aspirin and Aleve.  ROS Reports night time disturbances. Denies fevers, chills, or night sweats. Denies unexplained weight loss. Denies personal history of cancer. Denies changes in bowel or bladder habits. Denies recent unreported falls. Denies new or worsening dyspnea or wheezing. Denies headaches or dizziness.  Reports numbness, tingling or weakness  In the extremities - yes in the L hand Denies dizziness or presyncopal episodes Denies lower extremity edema    HISTORY & PERTINENT PRIOR DATA:  Prior History reviewed and updated per electronic medical record.  Significant/pertinent history, findings, studies include:  reports that she has never smoked. She has never used smokeless tobacco. Recent Labs    06/15/17  HGBA1C 5.6   No specialty comments available. No problems updated.  OBJECTIVE:  VS:  HT:5\' 7"  (170.2 cm)    WT:204 lb 3.2 oz (92.6 kg)  BMI:31.97    BP:122/88  HR:85bpm  TEMP: ( )  RESP:99 %   PHYSICAL EXAM: Constitutional: WDWN, Non-toxic appearing. Psychiatric: Alert & appropriately interactive.  Not depressed or anxious appearing. Respiratory: No increased work of breathing.  Trachea Midline Eyes: Pupils are equal.  EOM intact without nystagmus.  No scleral icterus  Vascular Exam: warm to touch no edema  upper extremity neuro exam: unremarkable normal sensation  MSK Exam: Left elbow has moderate amount of swelling diffusely with only a minimal amount of swelling directly over the olecranon bursa.  She has pain with palpation of the medial and lateral condyles.  Range of motion is from 30 degrees of flexion to 160 degrees of extension.  Ligamentously her elbow is stable.  X-rays and ultrasound reviewed   ASSESSMENT & PLAN:   1. Effusion of left elbow   2. Arthralgia of left elbow     PLAN: She has an effusion of her elbow and focal tenderness as well as underlying olecranon bursitis fatigue however given the lack of mechanism and generalized inflammatory response we will start her on a Medrol Dosepak and recommend compression and icing.  Avoid exacerbating activities and follow-up in 1 week.  Follow-up: Return in about 1 week (around 02/13/2018).     Please see additional documentation for Objective, Assessment and Plan sections. Pertinent additional documentation may be included in corresponding procedure notes, imaging studies, problem based documentation and patient instructions. Please see these sections of the encounter for additional information regarding this visit.  CMA/ATC served as Neurosurgeon during this visit. History, Physical, and  Plan performed by medical provider. Documentation and orders reviewed and attested to.      Andrena Mews, DO    [place] Sports Medicine Physician

## 2018-02-13 ENCOUNTER — Encounter: Payer: Self-pay | Admitting: Sports Medicine

## 2018-02-13 ENCOUNTER — Ambulatory Visit (INDEPENDENT_AMBULATORY_CARE_PROVIDER_SITE_OTHER): Payer: BLUE CROSS/BLUE SHIELD | Admitting: Sports Medicine

## 2018-02-13 VITALS — BP 112/86 | HR 95 | Ht 67.0 in | Wt 208.2 lb

## 2018-02-13 DIAGNOSIS — M25522 Pain in left elbow: Secondary | ICD-10-CM

## 2018-02-13 DIAGNOSIS — M25422 Effusion, left elbow: Secondary | ICD-10-CM | POA: Diagnosis not present

## 2018-02-13 NOTE — Progress Notes (Signed)
Susan [name]. Susan [name] Sports Medicine [facility] [phone]  Susan [name] - 42 y.o. female MRN [id number]  Date of birth: 1975/11/03  Visit Date: 02/13/2018  PCP: Helane Rima, DO   Referred by: Helane Rima, DO  Scribe for today's visit: Christoper Fabian, LAT, ATC     SUBJECTIVE:  Susan [name] is here for Follow-up (L elbow effusion) .   Her L elbow pain symptoms INITIALLY: Began 01/14/18 after mulching her yard.  Her L elbow was hot and swollen on Monday morning and went to the [facility] and was diagnosed w/ bursitis.  She wore some compression and has been alternating between Aspirin and Aleve. Described as mod-severe throbbing and stabbing pain, nonradiating Worsened with elbow movement and use Improved with Aleve and Aspirin Additional associated symptoms include: warmth and redness noted at the L elbow; some N/T noted in the L hand    At this time symptoms are worsening compared to onset w/ increased pain intensity She has been using compression intermittently, icing and using Aspirin and Aleve.  02/13/2018 Compared to the last office visit on 02/06/18, her previously described L elbow pain symptoms are improving w/ decreased pain.  She reports approximately 25% improvement.  She states that yesterday was a bad day and that she finished her steroid dose pack on Monday.  She reports still having decreased L elbow ROM both w/ flexion and extension.  She notes that the N/T in her L hand has resolved. Current symptoms are moderate & are radiating to L upper arm. She has been taking Methylprednisone but finished it on Monday.  Taking Advil prn.  ROS Denies night time disturbances. Denies fevers, chills, or night sweats. Denies unexplained weight loss. Denies personal history of cancer. Denies changes in bowel or bladder habits. Denies recent unreported falls. Denies new or worsening dyspnea or wheezing. Denies headaches or  dizziness.  Denies numbness, tingling or weakness  In the extremities.  Denies dizziness or presyncopal episodes Denies lower extremity edema    HISTORY & PERTINENT PRIOR DATA:  Prior History reviewed and updated per electronic medical record.  Significant/pertinent history, findings, studies include:  reports that she has never smoked. She has never used smokeless tobacco. Recent Labs    06/15/17 02/19/18 1409  HGBA1C 5.6  --   LABURIC  --  5.1   No specialty comments available. No problems updated.  OBJECTIVE:  VS:  HT:5\' 7"  (170.2 cm)   WT:208 lb 3.2 oz (94.4 kg)  BMI:32.6    BP:112/86  HR:95bpm  TEMP: ( )  RESP:97 %   PHYSICAL EXAM: Constitutional: WDWN, Non-toxic appearing. Psychiatric: Alert & appropriately interactive.  Not depressed or anxious appearing. Respiratory: No increased work of breathing.  Trachea Midline Eyes: Pupils are equal.  EOM intact without nystagmus.  No scleral icterus  Vascular Exam: warm to touch no edema  upper extremity neuro exam: unremarkable normal strength normal sensation normal reflexes  MSK Exam: Left upper extremity has generalized swelling and firmness but no significant rigidity of the compartments.  She has a slight flexor and extensor contracture of the elbow and marked pain with palpation of the distal triceps.   ASSESSMENT & PLAN:   1. Effusion of left elbow   2. Left elbow pain     PLAN: Given the persistent pain and lack of improvement with prior conservative management at urgent care and with me we will plan to go ahead and obtain an MRI with and  without contrast to evaluate for potential infectious etiology versus occult fracture versus possible inflammatory arthropathy/crystal arthropathy.  We will go ahead and treat her empirically for gout with Colcrys and prednisone and plan to follow-up with her after the MRI is obtained.  >50% of this 25 minute visit spent in direct patient counseling and/or coordination of  care.  Discussion was focused on education regarding the in discussing the pathoetiology and anticipated clinical course of the above condition.  Follow-up: Return for MRI review.     Please see additional documentation for Objective, Assessment and Plan sections. Pertinent additional documentation may be included in corresponding procedure notes, imaging studies, problem based documentation and patient instructions. Please see these sections of the encounter for additional information regarding this visit.  CMA/ATC served as Neurosurgeon during this visit. History, Physical, and Plan performed by medical provider. Documentation and orders reviewed and attested to.      Andrena Mews, DO    [place] Sports Medicine Physician

## 2018-02-13 NOTE — Patient Instructions (Signed)

## 2018-02-17 ENCOUNTER — Encounter: Payer: Self-pay | Admitting: Sports Medicine

## 2018-02-18 ENCOUNTER — Encounter: Payer: Self-pay | Admitting: Sports Medicine

## 2018-02-18 ENCOUNTER — Telehealth: Payer: Self-pay | Admitting: Family Medicine

## 2018-02-18 NOTE — Telephone Encounter (Signed)
See note

## 2018-02-18 NOTE — Telephone Encounter (Signed)
Copied from [place] [id number]. Topic: Quick Communication - See Telephone Encounter >> Feb 18, 2018  4:35 PM Bea Graff, NT wrote: CRM for notification. See Telephone encounter for: 02/18/18. Pt would like to see if something can be called in for the pain in her elbow and left arm? She states the pain has been unbearable today. She has a MRI scheduled for Wednesday. [id number] - [place], [place] [phone] (Phone) [phone] (Fax)

## 2018-02-19 ENCOUNTER — Ambulatory Visit (INDEPENDENT_AMBULATORY_CARE_PROVIDER_SITE_OTHER): Payer: BLUE CROSS/BLUE SHIELD | Admitting: Sports Medicine

## 2018-02-19 ENCOUNTER — Ambulatory Visit: Payer: BLUE CROSS/BLUE SHIELD | Admitting: Family Medicine

## 2018-02-19 ENCOUNTER — Encounter: Payer: Self-pay | Admitting: Sports Medicine

## 2018-02-19 VITALS — BP 120/86 | HR 88 | Temp 98.3°F | Ht 67.0 in | Wt 206.0 lb

## 2018-02-19 DIAGNOSIS — M25522 Pain in left elbow: Secondary | ICD-10-CM

## 2018-02-19 DIAGNOSIS — M25422 Effusion, left elbow: Secondary | ICD-10-CM | POA: Diagnosis not present

## 2018-02-19 LAB — CBC WITH DIFFERENTIAL/PLATELET
BASOS ABS: 0.1 10*3/uL (ref 0.0–0.1)
BASOS PCT: 0.5 % (ref 0.0–3.0)
EOS ABS: 0.2 10*3/uL (ref 0.0–0.7)
Eosinophils Relative: 1.3 % (ref 0.0–5.0)
HEMATOCRIT: 42 % (ref 36.0–46.0)
Hemoglobin: 14.2 g/dL (ref 12.0–15.0)
LYMPHS ABS: 2.9 10*3/uL (ref 0.7–4.0)
Lymphocytes Relative: 25 % (ref 12.0–46.0)
MCHC: 33.8 g/dL (ref 30.0–36.0)
MCV: 93.7 fl (ref 78.0–100.0)
Monocytes Absolute: 0.9 10*3/uL (ref 0.1–1.0)
Monocytes Relative: 7.4 % (ref 3.0–12.0)
NEUTROS ABS: 7.7 10*3/uL (ref 1.4–7.7)
NEUTROS PCT: 65.8 % (ref 43.0–77.0)
PLATELETS: 343 10*3/uL (ref 150.0–400.0)
RBC: 4.48 Mil/uL (ref 3.87–5.11)
RDW: 12.9 % (ref 11.5–15.5)
WBC: 11.7 10*3/uL — ABNORMAL HIGH (ref 4.0–10.5)

## 2018-02-19 LAB — SEDIMENTATION RATE: SED RATE: 10 mm/h (ref 0–20)

## 2018-02-19 LAB — CK: CK TOTAL: 55 U/L (ref 7–177)

## 2018-02-19 LAB — URIC ACID: Uric Acid, Serum: 5.1 mg/dL (ref 2.4–7.0)

## 2018-02-19 LAB — COMPREHENSIVE METABOLIC PANEL
ALT: 58 U/L — AB (ref 0–35)
AST: 41 U/L — AB (ref 0–37)
Albumin: 4.3 g/dL (ref 3.5–5.2)
Alkaline Phosphatase: 118 U/L — ABNORMAL HIGH (ref 39–117)
BILIRUBIN TOTAL: 0.5 mg/dL (ref 0.2–1.2)
BUN: 13 mg/dL (ref 6–23)
CHLORIDE: 102 meq/L (ref 96–112)
CO2: 28 meq/L (ref 19–32)
CREATININE: 0.77 mg/dL (ref 0.40–1.20)
Calcium: 9.4 mg/dL (ref 8.4–10.5)
GFR: 86.97 mL/min (ref >60.00)
GLUCOSE: 93 mg/dL (ref 70–99)
Potassium: 4.1 mEq/L (ref 3.5–5.1)
SODIUM: 139 meq/L (ref 135–145)
Total Protein: 7.1 g/dL (ref 6.0–8.3)

## 2018-02-19 LAB — C-REACTIVE PROTEIN: CRP: 2.9 mg/dL (ref 0.5–20.0)

## 2018-02-19 MED ORDER — PREDNISONE 20 MG PO TABS: ORAL_TABLET | ORAL | 0 refills | Status: DC

## 2018-02-19 MED ORDER — TRAMADOL HCL 50 MG PO TABS: 50.0000 mg | ORAL_TABLET | Freq: Four times a day (QID) | ORAL | 0 refills | Status: DC | PRN

## 2018-02-19 MED ORDER — COLCHICINE 0.6 MG PO TABS: 0.6000 mg | ORAL_TABLET | Freq: Every day | ORAL | 1 refills | Status: DC

## 2018-02-19 NOTE — Progress Notes (Signed)
Susan [name]. Susan [name] [facility] at [facility] [phone]  Susan [name] - 42 y.o. female MRN [id number]  Date of birth: 10-15-1975  Visit Date: 02/19/2018  PCP: Helane Rima, DO   Referred by: Helane Rima, DO  Scribe for today's visit: Christoper Fabian, LAT, ATC     SUBJECTIVE:  Susan [name] is here for Follow-up (L elbow pain) .   Her L elbow pain symptoms INITIALLY: Began 01/14/18 after mulching her yard.  Her L elbow was hot and swollen on Monday morning and went to the [facility] and was diagnosed w/ bursitis.  She wore some compression and has been alternating between Aspirin and Aleve. Described as mod-severe throbbing and stabbing pain, nonradiating Worsened with elbow movement and use Improved with Aleve and Aspirin Additional associated symptoms include: warmth and redness noted at the L elbow; some N/T noted in the L hand    At this time symptoms are worsening compared to onset w/ increased pain intensity She has been using compression intermittently, icing and using Aspirin and Aleve.  02/13/2018 Compared to the last office visit on 02/06/18, her previously described L elbow pain symptoms are improving w/ decreased pain.  She reports approximately 25% improvement.  She states that yesterday was a bad day and that she finished her steroid dose pack on Monday.  She reports still having decreased L elbow ROM both w/ flexion and extension.  She notes that the N/T in her L hand has resolved. Current symptoms are moderate & are radiating to L upper arm. She has been taking Methylprednisone but finished it on Monday.  Taking Advil prn.  02/19/2018: Compared to the last office visit on 02/13/18, her previously described L elbow pain symptoms are worsening w/ increased L elbow swelling and further decreased ROM.  She states that yesterday, her L elbow pain was significant but notes that she has no pain today.  She reports that her  recent increase in symptoms started on Saturday and progressively worsened through yesterday. Current symptoms are moderate & are radiating to the L upper arm. She has been taking Aleve and Bayer Aspirin prn for pain.  ROS Reports night time disturbances. Denies fevers, chills, or night sweats. Denies unexplained weight loss. Denies personal history of cancer. Denies changes in bowel or bladder habits. Denies recent unreported falls. Denies new or worsening dyspnea or wheezing. Denies headaches or dizziness.  Denies numbness, tingling or weakness  In the extremities.  Denies dizziness or presyncopal episodes Denies lower extremity edema    HISTORY & PERTINENT PRIOR DATA:  Prior History reviewed and updated per electronic medical record.  Significant/pertinent history, findings, studies include:  reports that she has never smoked. She has never used smokeless tobacco. Recent Labs    06/15/17 02/19/18 1409  HGBA1C 5.6  --   LABURIC  --  5.1   No specialty comments available. No problems updated.  OBJECTIVE:  VS:  HT:5\' 7"  (170.2 cm)   WT:206 lb (93.4 kg)  BMI:32.26    BP:120/86  HR:88bpm  TEMP:98.3 F (36.8 C)(Oral)  RESP:98 %   PHYSICAL EXAM: Constitutional: WDWN, Non-toxic appearing. Psychiatric: Alert & appropriately interactive.  Not depressed or anxious appearing. Respiratory: No increased work of breathing.  Trachea Midline Eyes: Pupils are equal.  EOM intact without nystagmus.  No scleral icterus  Vascular Exam: warm to touch no edema  upper extremity neuro exam: unremarkable  MSK Exam: Left elbow continues to have a moderate degree of  swelling over the olecranon bursa as well as however in the distal triceps, forearm and even down towards the wrist.  She has marked pain with elbow flexion extension is only approximately 30 to 40 degrees of arc.  She has pain with supination pronation.  Pain is worse with direct palpation over the elbow.  Most focally over  the posterior aspect of the elbow over the olecranon but there is no significant appreciable fluid collection or fluctuance.  There is some erythema and streaking.   ASSESSMENT & PLAN:   1. Effusion of left elbow   2. Left elbow pain   3. Arthralgia of left elbow     PLAN: We will check for infection versus gout and set her up likely for an MRI of the elbow with and without contrast to further evaluate for potential tenderness injury versus infectious process.  Given the marked limitations in her range of motion and concern for potential infectious etiology and will plan to have her follow-up in the emergency department if any evidence on lab work today.  We will treat empirically for gout with prednisone and Colcrys.   Follow-up: Return for MRI results review.      Please see additional documentation for Objective, Assessment and Plan sections. Pertinent additional documentation may be included in corresponding procedure notes, imaging studies, problem based documentation and patient instructions. Please see these sections of the encounter for additional information regarding this visit.  CMA/ATC served as Neurosurgeon during this visit. History, Physical, and Plan performed by medical provider. Documentation and orders reviewed and attested to.      Andrena Mews, DO    [facility]

## 2018-02-19 NOTE — Telephone Encounter (Signed)
Dr. Paulla Fore saw this patient for this.

## 2018-02-19 NOTE — Telephone Encounter (Signed)
Dr Rigby, please advise.  

## 2018-02-19 NOTE — Telephone Encounter (Signed)
Pt coming in for appointment later today.

## 2018-02-19 NOTE — Telephone Encounter (Signed)
Prescriptions for tramadol has been sent in.  Please call to inform her of this and ensure that she is not having any fevers chills or overlying skin changes.

## 2018-02-19 NOTE — Telephone Encounter (Signed)
Spoke with patient and she says that her elbow/arm is so sore she can barely bend it. She c/o increased swelling and warmth and mild bruising. She denies fever, chills, or skin changes.

## 2018-02-20 ENCOUNTER — Ambulatory Visit
Admission: RE | Admit: 2018-02-20 | Discharge: 2018-02-20 | Disposition: A | Discharge status: Home / Self Care | Payer: BLUE CROSS/BLUE SHIELD | Source: Ambulatory Visit | Attending: Sports Medicine | Admitting: Sports Medicine

## 2018-02-20 ENCOUNTER — Other Ambulatory Visit: Payer: Self-pay | Admitting: Sports Medicine

## 2018-02-20 DIAGNOSIS — M25522 Pain in left elbow: Secondary | ICD-10-CM

## 2018-02-20 DIAGNOSIS — R600 Localized edema: Secondary | ICD-10-CM | POA: Diagnosis not present

## 2018-02-20 MED ORDER — GADOBENATE DIMEGLUMINE 529 MG/ML IV SOLN: 20.0000 mL | Freq: Once | INTRAVENOUS | Status: DC | PRN

## 2018-02-21 ENCOUNTER — Other Ambulatory Visit: Payer: BLUE CROSS/BLUE SHIELD

## 2018-02-26 ENCOUNTER — Other Ambulatory Visit: Payer: Self-pay | Admitting: Physical Therapy

## 2018-02-26 DIAGNOSIS — M25522 Pain in left elbow: Principal | ICD-10-CM

## 2018-02-26 DIAGNOSIS — M25422 Effusion, left elbow: Secondary | ICD-10-CM

## 2018-02-26 NOTE — Progress Notes (Signed)
I spoke with Dr. Marlou Sa who would like to see the patient in clinic.  Please call and check on her progress and have her schedule with Dr. Marlou Sa at some point this week

## 2018-02-27 ENCOUNTER — Encounter (INDEPENDENT_AMBULATORY_CARE_PROVIDER_SITE_OTHER): Payer: Self-pay | Admitting: Orthopedic Surgery

## 2018-02-27 ENCOUNTER — Ambulatory Visit (INDEPENDENT_AMBULATORY_CARE_PROVIDER_SITE_OTHER): Payer: BLUE CROSS/BLUE SHIELD | Admitting: Orthopedic Surgery

## 2018-02-27 DIAGNOSIS — M25522 Pain in left elbow: Secondary | ICD-10-CM

## 2018-03-02 ENCOUNTER — Encounter (INDEPENDENT_AMBULATORY_CARE_PROVIDER_SITE_OTHER): Payer: Self-pay | Admitting: Orthopedic Surgery

## 2018-03-02 NOTE — Progress Notes (Signed)
Office Visit Note   Patient: Susan [name]           Date of Birth: 1976-09-18           MRN: [id number] Visit Date: 02/27/2018 Requested by: Andrena Mews, DO [address] [place], [place] [address] PCP: Helane Rima, DO  Subjective: Chief Complaint  Patient presents with  . Left Elbow - Pain    HPI: Susan [name] is a patient with left elbow pain.  Date of injury 01/14/2018.  Pain started after she was mulching her yard.  She tried a steroid Dosepak for 12 days.  That gives her good and bad days.  She takes Aleve and aspirin as needed for her pain.  She is right-hand dominant.  Scribes tenderness to touch.  She has not been on any antibiotics.  She has had an MRI scan which shows some soft tissue swelling along with partial triceps tendon tear.  She denies any significant weakness in the left arm.  Everyday activity is okay for her.  She did have an initial loss of motion but now that has restored itself with the Medrol Dosepak.  She denies any other joint pain.              ROS: All systems reviewed are negative as they relate to the chief complaint within the history of present illness.  Patient denies  fevers or chills.   Assessment & Plan: Visit Diagnoses:  1. Pain in left elbow     Plan: Impression is left elbow pain with normal laboratory values for gout and spondyloarthropathy.  Plan is currently Teadra is functional with her range of motion.  She does have partial triceps tendon tear and some weakness.  Still a little bit of warmth around the posterior aspect of the elbow on the left compared to the right.  Plan is observation for now.  Think we need to give this 6 weeks just to calm down from an inflammatory standpoint and then really assess whether or not this tendon needs repairing.  She does have some weakness on manual muscle testing but her functional strength and range of motion is adequate for activities of daily living.  No surgical intervention indicated at  this time.  This does not look like it is infection related.  Follow-Up Instructions: Return in about 6 weeks (around 04/10/2018).   Orders:  No orders of the defined types were placed in this encounter.  No orders of the defined types were placed in this encounter.     Procedures: No procedures performed   Clinical Data: No additional findings.  Objective: Vital Signs: LMP 01/30/2018   Physical Exam:   Constitutional: Patient appears well-developed HEENT:  Head: Normocephalic Eyes:EOM are normal Neck: Normal range of motion Cardiovascular: Normal rate Pulmonary/chest: Effort normal Neurologic: Patient is alert Skin: Skin is warm Psychiatric: Patient has normal mood and affect    Ortho Exam: Orthopedic exam demonstrates some warmth tenderness and mild erythema at the olecranon tip.  No evidence of any trauma to the skin in this area.  There is no fluid in the olecranon bursa.  No proximal lymphadenopathy present.  Motor sensory function to the hand is intact.  Has 5- out of 5 weakness on the left compared to the right with triceps extension.  Pronation supination is full.  Is about 15 degrees of full elbow flexion on the left compared to the right and only about 5 degrees of full extension left versus right.  The elbow region soft tissue is about 5 or 10% larger on the left compared to the right.  Specialty Comments:  No specialty comments available.  Imaging: No results found.   PMFS History: Patient Active Problem List   Diagnosis Date Noted  . Class 1 obesity due to excess calories without serious comorbidity with body mass index (BMI) of 33.0 to 33.9 in adult 11/17/2017  . Hypercholesterolemia, CHOL 258, HDL 47, TRIG 214, LDL 43, not on statin 08/18/2017  . H/O abnormal cervical Papanicolaou smear 01/06/2013   Past Medical History:  Diagnosis Date  . Abnormal pap 02/2003   CIN 2-3  . GERD (gastroesophageal reflux disease)   . Migraine with aura     Family  History  Problem Relation Age of Onset  . Hypertension Father   . Depression Father        Suicide  . Hypertension Maternal Grandmother   . Diabetes Maternal Grandmother   . Stroke Maternal Grandmother   . Heart failure Maternal Grandfather   . Diabetes Paternal Grandmother   . HIV Mother   . Other Mother   . Early death Mother        Age 48  . Depression Paternal Grandfather        Suicide  . Early death Brother        Age 68    Past Surgical History:  Procedure Laterality Date  . CERVICAL BIOPSY  W/ LOOP ELECTRODE EXCISION  02/2003   CIN 2-3  . CHOLECYSTECTOMY  2015  . COSMETIC SURGERY  2009   Social History   Occupational History    Employer: [employer]  Tobacco Use  . Smoking status: Never Smoker  . Smokeless tobacco: Never Used  Substance and Sexual Activity  . Alcohol use: Yes    Alcohol/week: 0.6 - 1.2 oz    Types: 1 - 2 Glasses of wine per week    Comment: 1-2 glasses/week  . Drug use: No  . Sexual activity: Yes    Partners: Male    Birth control/protection: Condom

## 2018-03-10 ENCOUNTER — Encounter: Payer: Self-pay | Admitting: Sports Medicine

## 2018-04-15 ENCOUNTER — Ambulatory Visit (INDEPENDENT_AMBULATORY_CARE_PROVIDER_SITE_OTHER): Payer: BLUE CROSS/BLUE SHIELD | Admitting: Orthopedic Surgery

## 2018-04-15 ENCOUNTER — Encounter (INDEPENDENT_AMBULATORY_CARE_PROVIDER_SITE_OTHER): Payer: Self-pay | Admitting: Orthopedic Surgery

## 2018-04-15 DIAGNOSIS — M25522 Pain in left elbow: Secondary | ICD-10-CM | POA: Diagnosis not present

## 2018-04-15 NOTE — Progress Notes (Signed)
Office Visit Note   Patient: Susan [name]           Date of Birth: August 02, 1976           MRN: [id number] Visit Date: 04/15/2018 Requested by: Helane Rima, DO [address] [place], [place] [address] PCP: Helane Rima, DO  Subjective: Chief Complaint  Patient presents with  . Left Elbow - Follow-up    HPI: Susan [name] is a patient with left elbow pain he.  Here for 6-week follow-up.  Decision point today was for or against tendon rupture repair.  She had partial tearing and some weakness on last clinic visit.  She states she is doing better.  Still has a little bit of tenderness to direct impact on the olecranon tip.  She does describe that her range of motion and strength is improving.              ROS: All systems reviewed are negative as they relate to the chief complaint within the history of present illness.  Patient denies  fevers or chills.   Assessment & Plan: Visit Diagnoses:  1. Pain in left elbow     Plan: Impression is left elbow pain now with good range of motion and symmetric triceps strength.  Triceps tendon is palpable and nontender to palpation on the left.  Plan at this time is observation.  I think is okay if she wants to start doing some 5 pound or less biceps curls.  Follow-up with me as needed  Follow-Up Instructions: Return if symptoms worsen or fail to improve.   Orders:  No orders of the defined types were placed in this encounter.  No orders of the defined types were placed in this encounter.     Procedures: No procedures performed   Clinical Data: No additional findings.  Objective: Vital Signs: There were no vitals taken for this visit.  Physical Exam:   Constitutional: Patient appears well-developed HEENT:  Head: Normocephalic Eyes:EOM are normal Neck: Normal range of motion Cardiovascular: Normal rate Pulmonary/chest: Effort normal Neurologic: Patient is alert Skin: Skin is warm Psychiatric: Patient has normal mood  and affect    Ortho Exam: Ortho exam demonstrates no tenderness to palpation on the left elbow.  Patient has full extension full flexion of the left elbow with 5 out of 5 triceps strength bilaterally.  Triceps tendon is palpable and nontender to palpation in that left elbow.  No warmth to the left elbow.  Specialty Comments:  No specialty comments available.  Imaging: No results found.   PMFS History: Patient Active Problem List   Diagnosis Date Noted  . Class 1 obesity due to excess calories without serious comorbidity with body mass index (BMI) of 33.0 to 33.9 in adult 11/17/2017  . Hypercholesterolemia, CHOL 258, HDL 47, TRIG 214, LDL 43, not on statin 08/18/2017  . H/O abnormal cervical Papanicolaou smear 01/06/2013   Past Medical History:  Diagnosis Date  . Abnormal pap 02/2003   CIN 2-3  . GERD (gastroesophageal reflux disease)   . Migraine with aura     Family History  Problem Relation Age of Onset  . Hypertension Father   . Depression Father        Suicide  . Hypertension Maternal Grandmother   . Diabetes Maternal Grandmother   . Stroke Maternal Grandmother   . Heart failure Maternal Grandfather   . Diabetes Paternal Grandmother   . HIV Mother   . Other Mother   . Early death Mother  Age 9  . Depression Paternal Grandfather        Suicide  . Early death Brother        Age 78    Past Surgical History:  Procedure Laterality Date  . CERVICAL BIOPSY  W/ LOOP ELECTRODE EXCISION  02/2003   CIN 2-3  . CHOLECYSTECTOMY  2015  . COSMETIC SURGERY  2009   Social History   Occupational History    Employer: [employer]  Tobacco Use  . Smoking status: Never Smoker  . Smokeless tobacco: Never Used  Substance and Sexual Activity  . Alcohol use: Yes    Alcohol/week: 0.6 - 1.2 oz    Types: 1 - 2 Glasses of wine per week    Comment: 1-2 glasses/week  . Drug use: No  . Sexual activity: Yes    Partners: Male    Birth control/protection: Condom

## 2018-06-13 ENCOUNTER — Ambulatory Visit: Payer: BLUE CROSS/BLUE SHIELD | Admitting: Obstetrics and Gynecology

## 2018-06-21 ENCOUNTER — Other Ambulatory Visit: Payer: Self-pay | Admitting: Family Medicine

## 2018-06-21 DIAGNOSIS — Z1231 Encounter for screening mammogram for malignant neoplasm of breast: Secondary | ICD-10-CM

## 2018-06-26 ENCOUNTER — Encounter: Payer: Self-pay | Admitting: Family Medicine

## 2018-06-26 ENCOUNTER — Ambulatory Visit (HOSPITAL_BASED_OUTPATIENT_CLINIC_OR_DEPARTMENT_OTHER)
Admission: RE | Admit: 2018-06-26 | Discharge: 2018-06-26 | Disposition: A | Discharge status: Home / Self Care | Payer: BLUE CROSS/BLUE SHIELD | Source: Ambulatory Visit | Attending: Family Medicine | Admitting: Family Medicine

## 2018-06-26 ENCOUNTER — Ambulatory Visit (INDEPENDENT_AMBULATORY_CARE_PROVIDER_SITE_OTHER): Payer: BLUE CROSS/BLUE SHIELD | Admitting: Family Medicine

## 2018-06-26 VITALS — BP 120/74 | HR 82 | Temp 98.3°F | Ht 67.0 in | Wt 209.0 lb

## 2018-06-26 DIAGNOSIS — R635 Abnormal weight gain: Secondary | ICD-10-CM | POA: Diagnosis not present

## 2018-06-26 DIAGNOSIS — E7800 Pure hypercholesterolemia, unspecified: Secondary | ICD-10-CM

## 2018-06-26 DIAGNOSIS — Z1231 Encounter for screening mammogram for malignant neoplasm of breast: Secondary | ICD-10-CM

## 2018-06-26 DIAGNOSIS — Z0000 Encounter for general adult medical examination without abnormal findings: Secondary | ICD-10-CM

## 2018-06-26 NOTE — Progress Notes (Signed)
Subjective:    Susan [name] is a 42 y.o. female and is here for a comprehensive physical exam.  Current Outpatient Medications:  .  desoximetasone (TOPICORT) 0.25 % cream, Use 1 X daily prn. But not long term, Disp: 30 g, Rfl: 2 .  Multiple Vitamins-Minerals (MULTIVITAMIN PO), Take by mouth daily., Disp: , Rfl:  .  phentermine (ADIPEX-P) 37.5 MG tablet, One po in the morning and 1/2 in the afternoon., Disp: 45 tablet, Rfl: 2  PMHx, SurgHx, SocialHx, Medications, and Allergies were reviewed in the Visit Navigator and updated as appropriate.   Past Medical History:  Diagnosis Date  . Abnormal pap 02/2003   CIN 2-3  . GERD (gastroesophageal reflux disease)   . Migraine with aura      Past Surgical History:  Procedure Laterality Date  . CERVICAL BIOPSY  W/ LOOP ELECTRODE EXCISION  02/2003   CIN 2-3  . CHOLECYSTECTOMY  2015  . COSMETIC SURGERY  2009     Family History  Problem Relation Age of Onset  . Hypertension Father   . Depression Father        Suicide  . Hypertension Maternal Grandmother   . Diabetes Maternal Grandmother   . Stroke Maternal Grandmother   . Heart failure Maternal Grandfather   . Diabetes Paternal Grandmother   . HIV Mother   . Other Mother   . Early death Mother        Age 69  . Depression Paternal Grandfather        Suicide  . Early death Brother        Age 42    Social History   Tobacco Use  . Smoking status: Never Smoker  . Smokeless tobacco: Never Used  Substance Use Topics  . Alcohol use: Yes    Alcohol/week: 1.0 - 2.0 standard drinks    Types: 1 - 2 Glasses of wine per week    Comment: 1-2 glasses/week  . Drug use: No    Review of Systems:   Pertinent items are noted in the HPI. Otherwise, ROS is negative.  Objective:   BP 120/74   Pulse 82   Temp 98.3 F (36.8 C) (Oral)   Ht 5\' 7"  (1.702 m)   Wt 209 lb (94.8 kg)   LMP 06/17/2018   SpO2 96%   BMI 32.73 kg/m   General appearance: alert, cooperative and  appears stated age. Head: normocephalic, without obvious abnormality, atraumatic. Neck: no adenopathy, supple, symmetrical, trachea midline; thyroid not enlarged, symmetric, no tenderness/mass/nodules. Lungs: clear to auscultation bilaterally. Heart: regular rate and rhythm Abdomen: soft, non-tender; no masses,  no organomegaly. Extremities: extremities normal, atraumatic, no cyanosis or edema. Skin: skin color, texture, turgor normal, no rashes or lesions. Lymph: cervical, supraclavicular, and axillary nodes normal; no abnormal inguinal nodes palpated. Neurologic: grossly normal.                                      Assessment/Plan:   [place] was seen today for follow-up.  Diagnoses and all orders for this visit:  Routine physical examination  Hypercholesterolemia, not on statin -     Comprehensive metabolic panel -     Lipid panel  Weight gain -     CBC with Differential/Platelet -     Comprehensive metabolic panel -     Lipid panel -     Hemoglobin A1c    Patient  Counseling: [x]    Nutrition: Stressed importance of moderation in sodium/caffeine intake, saturated fat and cholesterol, caloric balance, sufficient intake of fresh fruits, vegetables, fiber, calcium, iron, and 1 mg of folate supplement per day (for females capable of pregnancy).  [x]    Stressed the importance of regular exercise.   [x]    Substance Abuse: Discussed cessation/primary prevention of tobacco, alcohol, or other drug use; driving or other dangerous activities under the influence; availability of treatment for abuse.   [x]    Injury prevention: Discussed safety belts, safety helmets, smoke detector, smoking near bedding or upholstery.   [x]    Sexuality: Discussed sexually transmitted diseases, partner selection, use of condoms, avoidance of unintended pregnancy  and contraceptive alternatives.  [x]    Dental health: Discussed importance of regular tooth brushing, flossing, and dental visits.  [x]    Health  maintenance and immunizations reviewed. Please refer to Health maintenance section.   Briscoe Deutscher, DO [place]

## 2018-06-27 LAB — COMPREHENSIVE METABOLIC PANEL
ALT: 40 U/L — ABNORMAL HIGH (ref 0–35)
AST: 27 U/L (ref 0–37)
Albumin: 4.6 g/dL (ref 3.5–5.2)
Alkaline Phosphatase: 84 U/L (ref 39–117)
BUN: 11 mg/dL (ref 6–23)
CO2: 25 mEq/L (ref 19–32)
Calcium: 10.1 mg/dL (ref 8.4–10.5)
Chloride: 100 mEq/L (ref 96–112)
Creatinine, Ser: 1 mg/dL (ref 0.40–1.20)
GFR: 64.22 mL/min (ref >60.00)
Glucose, Bld: 98 mg/dL (ref 70–99)
Potassium: 3.9 mEq/L (ref 3.5–5.1)
Sodium: 136 mEq/L (ref 135–145)
Total Bilirubin: 0.6 mg/dL (ref 0.2–1.2)
Total Protein: 7.7 g/dL (ref 6.0–8.3)

## 2018-06-27 LAB — CBC WITH DIFFERENTIAL/PLATELET
Basophils Absolute: 0.1 10*3/uL (ref 0.0–0.1)
Basophils Relative: 1.2 % (ref 0.0–3.0)
Eosinophils Absolute: 0.1 10*3/uL (ref 0.0–0.7)
Eosinophils Relative: 0.6 % (ref 0.0–5.0)
HCT: 43.1 % (ref 36.0–46.0)
Hemoglobin: 14.6 g/dL (ref 12.0–15.0)
Lymphocytes Relative: 24.8 % (ref 12.0–46.0)
Lymphs Abs: 2.6 10*3/uL (ref 0.7–4.0)
MCHC: 33.9 g/dL (ref 30.0–36.0)
MCV: 93.2 fl (ref 78.0–100.0)
Monocytes Absolute: 0.4 10*3/uL (ref 0.1–1.0)
Monocytes Relative: 4.3 % (ref 3.0–12.0)
Neutro Abs: 7.1 10*3/uL (ref 1.4–7.7)
Neutrophils Relative %: 69.1 % (ref 43.0–77.0)
Platelets: 389 10*3/uL (ref 150.0–400.0)
RBC: 4.62 Mil/uL (ref 3.87–5.11)
RDW: 12.3 % (ref 11.5–15.5)
WBC: 10.3 10*3/uL (ref 4.0–10.5)

## 2018-06-27 LAB — LIPID PANEL
Cholesterol: 245 mg/dL — ABNORMAL HIGH (ref 0–200)
HDL: 48.3 mg/dL (ref >39.00)
LDL Cholesterol: 161 mg/dL — ABNORMAL HIGH (ref 0–99)
NonHDL: 196.58
Total CHOL/HDL Ratio: 5
Triglycerides: 178 mg/dL — ABNORMAL HIGH (ref 0.0–149.0)
VLDL: 35.6 mg/dL (ref 0.0–40.0)

## 2018-06-27 LAB — HEMOGLOBIN A1C: Hgb A1c MFr Bld: 5.5 % (ref 4.6–6.5)

## 2019-06-26 ENCOUNTER — Other Ambulatory Visit (HOSPITAL_BASED_OUTPATIENT_CLINIC_OR_DEPARTMENT_OTHER): Payer: Self-pay | Admitting: Family Medicine

## 2019-06-26 DIAGNOSIS — Z1231 Encounter for screening mammogram for malignant neoplasm of breast: Secondary | ICD-10-CM

## 2019-07-02 ENCOUNTER — Ambulatory Visit (HOSPITAL_BASED_OUTPATIENT_CLINIC_OR_DEPARTMENT_OTHER)
Admission: RE | Admit: 2019-07-02 | Discharge: 2019-07-02 | Disposition: A | Discharge status: Home / Self Care | Payer: BC Managed Care – PPO | Source: Ambulatory Visit | Attending: Family Medicine | Admitting: Family Medicine

## 2019-07-02 ENCOUNTER — Ambulatory Visit (INDEPENDENT_AMBULATORY_CARE_PROVIDER_SITE_OTHER): Payer: BC Managed Care – PPO | Admitting: Physician Assistant

## 2019-07-02 ENCOUNTER — Other Ambulatory Visit: Payer: Self-pay

## 2019-07-02 ENCOUNTER — Encounter: Payer: Self-pay | Admitting: Physician Assistant

## 2019-07-02 DIAGNOSIS — Z1231 Encounter for screening mammogram for malignant neoplasm of breast: Secondary | ICD-10-CM | POA: Diagnosis not present

## 2019-07-02 DIAGNOSIS — Z6833 Body mass index (BMI) 33.0-33.9, adult: Secondary | ICD-10-CM

## 2019-07-02 DIAGNOSIS — E6609 Other obesity due to excess calories: Secondary | ICD-10-CM

## 2019-07-02 MED ORDER — PHENTERMINE HCL 37.5 MG PO TABS: ORAL_TABLET | ORAL | 2 refills | Status: DC

## 2019-07-02 NOTE — Progress Notes (Signed)
Virtual Visit via Video   I connected with Susan [name] on 07/02/19 at  1:20 PM EDT by a video enabled telemedicine application and verified that I am speaking with the correct person using two identifiers. Location patient: Home Location provider: [facility], Office Persons participating in the virtual visit: Cellestine, Saman PA-C  I discussed the limitations of evaluation and management by telemedicine and the availability of in person appointments. The patient expressed understanding and agreed to proceed.  Subjective:   HPI:   Obesity Patient would like to discuss resuming phentermine.  She was taking 1 tablet last year and then eventually was increased to 1-1/2 tablets.  She was on phentermine for approximately 6 months.  She felt that it was very helpful for her.  She denied any chest pain, shortness of breath, palpitations, irritability while on this medication.  She is interested in resuming this, she has had some weight gain from Covid, and is ready to get back on track.  She denies any new cardiac history or any concerning cardiac symptoms at this time.  Body mass index is 33.67 kg/m.  Wt Readings from Last 5 Encounters:  07/02/19 215 lb (97.5 kg)  06/26/18 209 lb (94.8 kg)  02/19/18 206 lb (93.4 kg)  02/13/18 208 lb 3.2 oz (94.4 kg)  02/06/18 204 lb 3.2 oz (92.6 kg)     ROS: See pertinent positives and negatives per HPI.  Patient Active Problem List   Diagnosis Date Noted  . Class 1 obesity due to excess calories without serious comorbidity with body mass index (BMI) of 33.0 to 33.9 in adult 11/17/2017  . Hypercholesterolemia, not on statin 08/18/2017  . H/O abnormal cervical Papanicolaou smear 01/06/2013    Social History   Tobacco Use  . Smoking status: Never Smoker  . Smokeless tobacco: Never Used  Substance Use Topics  . Alcohol use: Yes    Alcohol/week: 1.0 - 2.0 standard drinks    Types: 1 - 2 Glasses of wine per week   Comment: 1-2 glasses/week    Current Outpatient Medications:  .  desoximetasone (TOPICORT) 0.25 % cream, Use 1 X daily prn. But not long term, Disp: 30 g, Rfl: 2 .  Multiple Vitamins-Minerals (MULTIVITAMIN PO), Take by mouth daily., Disp: , Rfl:  .  phentermine (ADIPEX-P) 37.5 MG tablet, One po in the morning, Disp: 30 tablet, Rfl: 2  No Known Allergies  Objective:   VITALS: Per patient if applicable, see vitals. GENERAL: Alert, appears well and in no acute distress. HEENT: Atraumatic, conjunctiva clear, no obvious abnormalities on inspection of external nose and ears. NECK: Normal movements of the head and neck. CARDIOPULMONARY: No increased WOB. Speaking in clear sentences. I:E ratio WNL.  MS: Moves all visible extremities without noticeable abnormality. PSYCH: Pleasant and cooperative, well-groomed. Speech normal rate and rhythm. Affect is appropriate. Insight and judgement are appropriate. Attention is focused, linear, and appropriate.  NEURO: CN grossly intact. Oriented as arrived to appointment on time with no prompting. Moves both UE equally.  SKIN: No obvious lesions, wounds, erythema, or cyanosis noted on face or hands.  Assessment and Plan:   [name] was seen today for transfer care.  Diagnoses and all orders for this visit:  Class 1 obesity due to excess calories without serious comorbidity with body mass index (BMI) of 33.0 to 33.9 in adult -     phentermine (ADIPEX-P) 37.5 MG tablet; One po in the morning   Will resume phentermine. She has  a scheduled visit with me in 3 weeks in the office, we will update her labs and discuss further plans/goals at that time. Side effects of medication were discussed with patient and she is aware to seek medical attention if she develops any concerning side effects.  . Reviewed expectations re: course of current medical issues. . Discussed self-management of symptoms. . Outlined signs and symptoms indicating need for more acute  intervention. . Patient verbalized understanding and all questions were answered. Marland Kitchen Health Maintenance issues including appropriate healthy diet, exercise, and smoking avoidance were discussed with patient. . See orders for this visit as documented in the electronic medical record.  I discussed the assessment and treatment plan with the patient. The patient was provided an opportunity to ask questions and all were answered. The patient agreed with the plan and demonstrated an understanding of the instructions.   The patient was advised to call back or seek an in-person evaluation if the symptoms worsen or if the condition fails to improve as anticipated.   [place], [place] 07/02/2019

## 2019-07-21 ENCOUNTER — Ambulatory Visit (INDEPENDENT_AMBULATORY_CARE_PROVIDER_SITE_OTHER): Payer: BC Managed Care – PPO | Admitting: Physician Assistant

## 2019-07-21 ENCOUNTER — Other Ambulatory Visit: Payer: Self-pay

## 2019-07-21 ENCOUNTER — Encounter: Payer: Self-pay | Admitting: Physician Assistant

## 2019-07-21 VITALS — BP 118/80 | HR 70 | Temp 98.2°F | Ht 68.0 in | Wt 220.5 lb

## 2019-07-21 DIAGNOSIS — Z0001 Encounter for general adult medical examination with abnormal findings: Secondary | ICD-10-CM

## 2019-07-21 DIAGNOSIS — E7800 Pure hypercholesterolemia, unspecified: Secondary | ICD-10-CM

## 2019-07-21 DIAGNOSIS — G4700 Insomnia, unspecified: Secondary | ICD-10-CM | POA: Diagnosis not present

## 2019-07-21 DIAGNOSIS — D229 Melanocytic nevi, unspecified: Secondary | ICD-10-CM

## 2019-07-21 DIAGNOSIS — E669 Obesity, unspecified: Secondary | ICD-10-CM

## 2019-07-21 DIAGNOSIS — K649 Unspecified hemorrhoids: Secondary | ICD-10-CM | POA: Diagnosis not present

## 2019-07-21 DIAGNOSIS — R21 Rash and other nonspecific skin eruption: Secondary | ICD-10-CM

## 2019-07-21 LAB — COMPREHENSIVE METABOLIC PANEL
ALT: 32 U/L (ref 0–35)
AST: 22 U/L (ref 0–37)
Albumin: 4.4 g/dL (ref 3.5–5.2)
Alkaline Phosphatase: 80 U/L (ref 39–117)
BUN: 13 mg/dL (ref 6–23)
CO2: 24 mEq/L (ref 19–32)
Calcium: 9.2 mg/dL (ref 8.4–10.5)
Chloride: 103 mEq/L (ref 96–112)
Creatinine, Ser: 0.91 mg/dL (ref 0.40–1.20)
GFR: 67.04 mL/min (ref >60.00)
Glucose, Bld: 103 mg/dL — ABNORMAL HIGH (ref 70–99)
Potassium: 3.8 mEq/L (ref 3.5–5.1)
Sodium: 136 mEq/L (ref 135–145)
Total Bilirubin: 0.5 mg/dL (ref 0.2–1.2)
Total Protein: 6.9 g/dL (ref 6.0–8.3)

## 2019-07-21 LAB — LIPID PANEL
Cholesterol: 235 mg/dL — ABNORMAL HIGH (ref 0–200)
HDL: 45.4 mg/dL (ref >39.00)
LDL Cholesterol: 165 mg/dL — ABNORMAL HIGH (ref 0–99)
NonHDL: 189.22
Total CHOL/HDL Ratio: 5
Triglycerides: 122 mg/dL (ref 0.0–149.0)
VLDL: 24.4 mg/dL (ref 0.0–40.0)

## 2019-07-21 LAB — CBC WITH DIFFERENTIAL/PLATELET
Basophils Absolute: 0 10*3/uL (ref 0.0–0.1)
Basophils Relative: 0.5 % (ref 0.0–3.0)
Eosinophils Absolute: 0.1 10*3/uL (ref 0.0–0.7)
Eosinophils Relative: 0.9 % (ref 0.0–5.0)
HCT: 39.5 % (ref 36.0–46.0)
Hemoglobin: 13.4 g/dL (ref 12.0–15.0)
Lymphocytes Relative: 29.8 % (ref 12.0–46.0)
Lymphs Abs: 2.4 10*3/uL (ref 0.7–4.0)
MCHC: 34 g/dL (ref 30.0–36.0)
MCV: 93.8 fl (ref 78.0–100.0)
Monocytes Absolute: 0.4 10*3/uL (ref 0.1–1.0)
Monocytes Relative: 5.6 % (ref 3.0–12.0)
Neutro Abs: 5 10*3/uL (ref 1.4–7.7)
Neutrophils Relative %: 63.2 % (ref 43.0–77.0)
Platelets: 327 10*3/uL (ref 150.0–400.0)
RBC: 4.21 Mil/uL (ref 3.87–5.11)
RDW: 12.6 % (ref 11.5–15.5)
WBC: 7.9 10*3/uL (ref 4.0–10.5)

## 2019-07-21 LAB — HEMOGLOBIN A1C: Hgb A1c MFr Bld: 5.5 % (ref 4.6–6.5)

## 2019-07-21 MED ORDER — TRAZODONE HCL 50 MG PO TABS: 25.0000 mg | ORAL_TABLET | Freq: Every evening | ORAL | 3 refills | Status: DC | PRN

## 2019-07-21 MED ORDER — HYDROCORTISONE ACETATE 25 MG RE SUPP: 25.0000 mg | Freq: Two times a day (BID) | RECTAL | 0 refills | Status: DC

## 2019-07-21 MED ORDER — TRIAMCINOLONE ACETONIDE 0.5 % EX OINT: TOPICAL_OINTMENT | CUTANEOUS | 0 refills | Status: DC

## 2019-07-21 NOTE — Patient Instructions (Signed)
It was great to see you!  1. You will be contacted about referral to derm, try the cream I have sent in, it is high potency so do not use long term 2. Try trazodone for sleep. Start with half tablet. 3. Try the suppositories for your hemorrhoid. Try to sit on cushions if sitting for long periods of time. If no better, let me know.  Please go to the lab for blood work.   Our office will call you with your results unless you have chosen to receive results via MyChart.  If your blood work is normal we will follow-up each year for physicals and as scheduled for chronic medical problems.  If anything is abnormal we will treat accordingly and get you in for a follow-up.  Take care,  [facility] Maintenance, Female Adopting a healthy lifestyle and getting preventive care are important in promoting health and wellness. Ask your health care provider about:  The right schedule for you to have regular tests and exams.  Things you can do on your own to prevent diseases and keep yourself healthy. What should I know about diet, weight, and exercise? Eat a healthy diet   Eat a diet that includes plenty of vegetables, fruits, low-fat dairy products, and lean protein.  Do not eat a lot of foods that are high in solid fats, added sugars, or sodium. Maintain a healthy weight Body mass index (BMI) is used to identify weight problems. It estimates body fat based on height and weight. Your health care provider can help determine your BMI and help you achieve or maintain a healthy weight. Get regular exercise Get regular exercise. This is one of the most important things you can do for your health. Most adults should:  Exercise for at least 150 minutes each week. The exercise should increase your heart rate and make you sweat (moderate-intensity exercise).  Do strengthening exercises at least twice a week. This is in addition to the moderate-intensity exercise.  Spend less time sitting.  Even light physical activity can be beneficial. Watch cholesterol and blood lipids Have your blood tested for lipids and cholesterol at 44 years of age, then have this test every 5 years. Have your cholesterol levels checked more often if:  Your lipid or cholesterol levels are high.  You are older than 44 years of age.  You are at high risk for heart disease. What should I know about cancer screening? Depending on your health history and family history, you may need to have cancer screening at various ages. This may include screening for:  Breast cancer.  Cervical cancer.  Colorectal cancer.  Skin cancer.  Lung cancer. What should I know about heart disease, diabetes, and high blood pressure? Blood pressure and heart disease  High blood pressure causes heart disease and increases the risk of stroke. This is more likely to develop in people who have high blood pressure readings, are of African descent, or are overweight.  Have your blood pressure checked: ? Every 3-5 years if you are 43-69 years of age. ? Every year if you are 110 years old or older. Diabetes Have regular diabetes screenings. This checks your fasting blood sugar level. Have the screening done:  Once every three years after age 6 if you are at a normal weight and have a low risk for diabetes.  More often and at a younger age if you are overweight or have a high risk for diabetes. What should I know about  preventing infection? Hepatitis B If you have a higher risk for hepatitis B, you should be screened for this virus. Talk with your health care provider to find out if you are at risk for hepatitis B infection. Hepatitis C Testing is recommended for:  Everyone born from [birth year] through 1966.  Anyone with known risk factors for hepatitis C. Sexually transmitted infections (STIs)  Get screened for STIs, including gonorrhea and chlamydia, if: ? You are sexually active and are younger than 44 years of age. ?  You are older than 44 years of age and your health care provider tells you that you are at risk for this type of infection. ? Your sexual activity has changed since you were last screened, and you are at increased risk for chlamydia or gonorrhea. Ask your health care provider if you are at risk.  Ask your health care provider about whether you are at high risk for HIV. Your health care provider may recommend a prescription medicine to help prevent HIV infection. If you choose to take medicine to prevent HIV, you should first get tested for HIV. You should then be tested every 3 months for as long as you are taking the medicine. Pregnancy  If you are about to stop having your period (premenopausal) and you may become pregnant, seek counseling before you get pregnant.  Take 400 to 800 micrograms (mcg) of folic acid every day if you become pregnant.  Ask for birth control (contraception) if you want to prevent pregnancy. Osteoporosis and menopause Osteoporosis is a disease in which the bones lose minerals and strength with aging. This can result in bone fractures. If you are 73 years old or older, or if you are at risk for osteoporosis and fractures, ask your health care provider if you should:  Be screened for bone loss.  Take a calcium or vitamin D supplement to lower your risk of fractures.  Be given hormone replacement therapy (HRT) to treat symptoms of menopause. Follow these instructions at home: Lifestyle  Do not use any products that contain nicotine or tobacco, such as cigarettes, e-cigarettes, and chewing tobacco. If you need help quitting, ask your health care provider.  Do not use street drugs.  Do not share needles.  Ask your health care provider for help if you need support or information about quitting drugs. Alcohol use  Do not drink alcohol if: ? Your health care provider tells you not to drink. ? You are pregnant, may be pregnant, or are planning to become pregnant.   If you drink alcohol: ? Limit how much you use to 0-1 drink a day. ? Limit intake if you are breastfeeding.  Be aware of how much alcohol is in your drink. In the [place] one drink equals one 12 oz bottle of beer (355 mL), one 5 oz glass of wine (148 mL), or one 1 oz glass of hard liquor (44 mL). General instructions  Schedule regular health, dental, and eye exams.  Stay current with your vaccines.  Tell your health care provider if: ? You often feel depressed. ? You have ever been abused or do not feel safe at home. Summary  Adopting a healthy lifestyle and getting preventive care are important in promoting health and wellness.  Follow your health care provider's instructions about healthy diet, exercising, and getting tested or screened for diseases.  Follow your health care provider's instructions on monitoring your cholesterol and blood pressure. This information is not intended to replace advice given to you  by your health care provider. Make sure you discuss any questions you have with your health care provider. Document Released: 03/20/2011 Document Revised: 08/28/2018 Document Reviewed: 08/28/2018 Elsevier Patient Education  2020 [employer].

## 2019-07-21 NOTE — Progress Notes (Signed)
I acted as a Education administrator for [employer], PA-C Anselmo Pickler, LPM   Subjective:    Susan [name] is a 43 y.o. female and is here for a comprehensive physical exam.   HPI  Health Maintenance Due  Topic Date Due  . MAMMOGRAM  06/27/2019    Acute Concerns: Insomnia -- has had issues for several years with insomnia.  She reports that she is a light sleeper, and current sleep hygiene issues include using a tablet to read while in bed.  Her husband sleeps without any issues.  She denies any concerns for sleep apnea.  She will take occasional melatonin without any relief.  She only drinks 1 to 2 glasses of wine a week. Rash --she developed a rash on her chest a few weeks ago, the area was sensitive to touch when it was against her bra.  She had what she thought was some almost cystic lesions, did not know if it was acne.  She put on some hydrocortisone cream that she had which had some mild improvement.  Overall the rash has improved but is still there.  She also has atypical moles which she would like to have checked by a dermatologist.  She denies fever, chills, purulent discharge, asymmetric distribution along dermatomal pattern. Hemorrhoid --patient reports that she has never had a hemorrhoid before.  She thinks that she may have 1 and has been using Preparation H ointment to the area.  She did have some recent constipation that may have precipitated this event.  She has had trace rectal bleeding when wiping w/ toilet paper.  She is due for colonoscopy for screening starting next year.  She denies any unintentional weight loss, abdominal pain, night sweats, fevers.  She does do a significant amount of sitting with her job right now due to [employer].   Chronic Issues: Obesity --we restarted her phentermine at last visit.  She states that she is tolerating this well.  It is helping her focus, decrease her appetite, and help with cravings.  She is doing her best to drink as much water as she  can as this does give her [place]mouth. Hyperlipidemia --has never been on a statin, has been trying to lose weight and have better health the food choices to help with this.  Due for repeat lipid panel.  Health Maintenance: Immunizations --up-to-date Colonoscopy --never, will be due for screening next year Mammogram --had it done 2 weeks ago, results are not back, discussed with patient to reach out to imaging center about this PAP --completed in September 2018 by OB/GYN, normal Diet --tries to eat healthy Sleep habits --insomnia, see above Exercise --trying to get back into more regular exercise regimen Weight -- Weight: 220 lb 8 oz (100 kg)  Body mass index is 33.53 kg/m.  Mood --overall okay Weight history:  Wt Readings from Last 10 Encounters:  07/21/19 220 lb 8 oz (100 kg)  07/02/19 215 lb (97.5 kg)  06/26/18 209 lb (94.8 kg)  02/19/18 206 lb (93.4 kg)  02/13/18 208 lb 3.2 oz (94.4 kg)  02/06/18 204 lb 3.2 oz (92.6 kg)  11/16/17 211 lb 12.8 oz (96.1 kg)  08/17/17 218 lb (98.9 kg)  06/11/17 212 lb (96.2 kg)  01/08/14 216 lb (98 kg)   Patient's last menstrual period was 06/29/2019. Alcohol use: 1-2 glasses wine/week Tobacco use: never  Depression screen [facility] 2/9 07/21/2019  Decreased Interest 0  Down, Depressed, Hopeless 0  PHQ - 2 Score 0  Other providers/specialists: Patient Care Team: [name], [place] as PCP - General (Physician Assistant)    PMHx, SurgHx, SocialHx, Medications, and Allergies were reviewed in the Visit Navigator and updated as appropriate.   Past Medical History:  Diagnosis Date  . Abnormal pap 02/2003   CIN 2-3  . GERD (gastroesophageal reflux disease)   . Migraine with aura      Past Surgical History:  Procedure Laterality Date  . CERVICAL BIOPSY  W/ LOOP ELECTRODE EXCISION  02/2003   CIN 2-3  . CHOLECYSTECTOMY  2015  . COSMETIC SURGERY  2009     Family History  Problem Relation Age of Onset  . Hypertension Father   .  Depression Father        Suicide  . Hypertension Maternal Grandmother   . Diabetes Maternal Grandmother   . Stroke Maternal Grandmother   . Heart failure Maternal Grandfather   . Diabetes Paternal Grandmother   . HIV Mother   . Other Mother   . Early death Mother        Age 64  . Depression Paternal Grandfather        Suicide  . Early death Brother        Age 80    Social History   Tobacco Use  . Smoking status: Never Smoker  . Smokeless tobacco: Never Used  Substance Use Topics  . Alcohol use: Yes    Alcohol/week: 1.0 - 2.0 standard drinks    Types: 1 - 2 Glasses of wine per week    Comment: 1-2 glasses/week  . Drug use: No    Review of Systems:   Review of Systems  Constitutional: Negative for chills, fever, malaise/fatigue and weight loss.  HENT: Negative for hearing loss, sinus pain and sore throat.   Respiratory: Negative for cough and hemoptysis.   Cardiovascular: Negative for chest pain, palpitations, leg swelling and PND.  Gastrointestinal: Negative for abdominal pain, constipation, diarrhea, heartburn, nausea and vomiting.  Genitourinary: Negative for dysuria, frequency and urgency.  Musculoskeletal: Negative for back pain, myalgias and neck pain.  Skin: Positive for rash. Negative for itching.  Neurological: Negative for dizziness, tingling, seizures and headaches.  Endo/Heme/Allergies: Negative for polydipsia.  Psychiatric/Behavioral: Negative for depression. The patient has insomnia. The patient is not nervous/anxious.     Objective:   BP 118/80 (BP Location: Left Arm, Patient Position: Sitting, Cuff Size: Large)   Pulse 70   Temp 98.2 F (36.8 C) (Temporal)   Ht 5\' 8"  (1.727 m)   Wt 220 lb 8 oz (100 kg)   LMP 06/29/2019   SpO2 98%   BMI 33.53 kg/m  Body mass index is 33.53 kg/m.   General Appearance:    Alert, cooperative, no distress, appears stated age  Head:    Normocephalic, without obvious abnormality, atraumatic  Eyes:    PERRL,  conjunctiva/corneas clear, EOM's intact, fundi    benign, both eyes  Ears:    Normal TM's and external ear canals, both ears  Nose:   Nares normal, septum midline, mucosa normal, no drainage    or sinus tenderness  Throat:   Lips, mucosa, and tongue normal; teeth and gums normal  Neck:   Supple, symmetrical, trachea midline, no adenopathy;    thyroid:  no enlargement/tenderness/nodules; no carotid   bruit or JVD  Back:     Symmetric, no curvature, ROM normal, no CVA tenderness  Lungs:     Clear to auscultation bilaterally, respirations unlabored  Chest Wall:  No tenderness or deformity   Heart:    Regular rate and rhythm, S1 and S2 normal, no murmur, rub or gallop  Breast Exam:    No tenderness, masses, or nipple abnormality  Abdomen:     Soft, non-tender, bowel sounds active all four quadrants,    no masses, no organomegaly; patient declined rectal exam  Genitalia:    Deferred  Extremities:   Extremities normal, atraumatic, no cyanosis or edema  Pulses:   2+ and symmetric all extremities  Skin:   Skin color, texture, turgor normal; numerous erythematous papules to central chest and under bilateral inner breasts  Lymph nodes:   Cervical, supraclavicular, and axillary nodes normal  Neurologic:   CNII-XII intact, normal strength, sensation and reflexes    throughout    Assessment/Plan:   Janara was seen today for annual exam.  Diagnoses and all orders for this visit:  Encounter for general adult medical examination with abnormal findings Today patient counseled on age appropriate routine health concerns for screening and prevention, each reviewed and up to date or declined. Immunizations reviewed and up to date or declined. Labs ordered and reviewed. Risk factors for depression reviewed and negative. Hearing function and visual acuity are intact. ADLs screened and addressed as needed. Functional ability and level of safety reviewed and appropriate. Education, counseling and referrals  performed based on assessed risks today. Patient provided with a copy of personalized plan for preventive services. -     CBC with Differential/Platelet -     Comprehensive metabolic panel  Hypercholesterolemia, not on statin Repeat labs and will recalculate ASCVD and provide further advice at that time. -     Lipid panel  Obesity (BMI 30-39.9) Continue phentermine.  Will require a follow-up prior to refills, as medication is not indicated if she is not losing weight.  Consider referral to healthy weight and wellness at follow-up. -     Hemoglobin A1c  Insomnia, unspecified type We are going to try low-dose trazodone.  Discussed need for better sleep hygiene.  Continue to avoid excessive alcohol intake.  Follow-up if no improvement of sleep symptoms.  Hemorrhoids, unspecified hemorrhoid type Patient declined for me to evaluate this today.  No red flags on discussion today, discussed that with her trace rectal bleeding, we could go ahead and put in referral for GI, but she declined at this time.  Will trial hydrocortisone suppository and if no improvement, discussed will refer to GI.  Patient verbalized understanding to plan.  Rash Will trial Kenalog ointment, recommended that she not use this long-term.  She does want a referral to dermatology and I have placed this today.  Atypical nevus Referral to dermatology.  Other orders -     triamcinolone ointment (KENALOG) 0.5 %; Apply to affected area 1-2 times daily, not long term -     hydrocortisone (ANUSOL-HC) 25 MG suppository; Place 1 suppository (25 mg total) rectally 2 (two) times daily. -     traZODone (DESYREL) 50 MG tablet; Take 0.5-1 tablets (25-50 mg total) by mouth at bedtime as needed for sleep.     Well Adult Exam: Labs ordered: Yes. Patient counseling was done. See below for items discussed. Discussed the patient's BMI.  The BMI is not in the acceptable range; BMI management plan is completed Follow up in 1 month. Breast  cancer screening: Performed, however results pending. Cervical cancer screening: Up-to-date  Patient Counseling: [x]    Nutrition: Stressed importance of moderation in sodium/caffeine intake, saturated fat and cholesterol, caloric  balance, sufficient intake of fresh fruits, vegetables, fiber, calcium, iron, and 1 mg of folate supplement per day (for females capable of pregnancy).  [x]    Stressed the importance of regular exercise.   [x]    Substance Abuse: Discussed cessation/primary prevention of tobacco, alcohol, or other drug use; driving or other dangerous activities under the influence; availability of treatment for abuse.   [x]    Injury prevention: Discussed safety belts, safety helmets, smoke detector, smoking near bedding or upholstery.   [x]    Sexuality: Discussed sexually transmitted diseases, partner selection, use of condoms, avoidance of unintended pregnancy  and contraceptive alternatives.  [x]    Dental health: Discussed importance of regular tooth brushing, flossing, and dental visits.  [x]    Health maintenance and immunizations reviewed. Please refer to Health maintenance section.   CMA or LPN served as scribe during this visit. History, Physical, and Plan performed by medical provider. The above documentation has been reviewed and is accurate and complete.   [name], PA-C [place]

## 2019-07-24 DIAGNOSIS — Z1231 Encounter for screening mammogram for malignant neoplasm of breast: Secondary | ICD-10-CM | POA: Diagnosis not present

## 2019-07-24 NOTE — Addendum Note (Signed)
Encounter addended by: Theone Murdoch on: 07/24/2019 3:27 PM  Actions taken: Imaging Exam ended

## 2019-07-28 ENCOUNTER — Encounter: Payer: Self-pay | Admitting: Physician Assistant

## 2019-07-29 ENCOUNTER — Ambulatory Visit (INDEPENDENT_AMBULATORY_CARE_PROVIDER_SITE_OTHER): Payer: BC Managed Care – PPO | Admitting: Family Medicine

## 2019-07-29 ENCOUNTER — Encounter: Payer: Self-pay | Admitting: Family Medicine

## 2019-07-29 ENCOUNTER — Other Ambulatory Visit: Payer: Self-pay

## 2019-07-29 VITALS — BP 122/82 | HR 72 | Temp 98.0°F | Ht 68.0 in | Wt 218.0 lb

## 2019-07-29 DIAGNOSIS — K644 Residual hemorrhoidal skin tags: Secondary | ICD-10-CM

## 2019-07-29 NOTE — Progress Notes (Signed)
Subjective  CC:  Chief Complaint  Patient presents with  . Hemorrhoids    HPI: Susan [name] is a 43 y.o. female who presents to the office today to address the problems listed above in the chief complaint.  New pt to me and recent records reviewed.   42 yo with hemorrhoid: had bleeding several days ago. Now only mild tinge when wiping. No pain. Used rectal steroid suppository for a week. Feels fine but wants to make sure it is a hemorrhoid and nothing more serious.    Assessment  1. External hemorrhoid, bleeding      Plan   Reassured. Mild inflammation present w/o tenderness, thrombosis or signs of infection. rec supportive care and hydrocortisone cream otc x 1 week. See AVS for further education.   Follow up: Return if symptoms worsen or fail to improve.  Visit date not found  No orders of the defined types were placed in this encounter.  No orders of the defined types were placed in this encounter.     I reviewed the patients updated PMH, FH, and SocHx.    Patient Active Problem List   Diagnosis Date Noted  . Class 1 obesity due to excess calories without serious comorbidity with body mass index (BMI) of 33.0 to 33.9 in adult 11/17/2017  . Hypercholesterolemia, not on statin 08/18/2017  . H/O abnormal cervical Papanicolaou smear 01/06/2013   Current Meds  Medication Sig  . desoximetasone (TOPICORT) 0.25 % cream Use 1 X daily prn. But not long term  . Lidocaine-Glycerin (PREPARATION H EX) Apply 1 application topically as needed.  . Multiple Vitamins-Minerals (MULTIVITAMIN PO) Take by mouth daily.  . phentermine (ADIPEX-P) 37.5 MG tablet One po in the morning  . traZODone (DESYREL) 50 MG tablet Take 0.5-1 tablets (25-50 mg total) by mouth at bedtime as needed for sleep.  Marland Kitchen triamcinolone ointment (KENALOG) 0.5 % Apply to affected area 1-2 times daily, not long term  . [DISCONTINUED] hydrocortisone (ANUSOL-HC) 25 MG suppository Place 1 suppository (25 mg total)  rectally 2 (two) times daily.    Allergies: Patient has No Known Allergies. Family History: Patient family history includes Depression in her father and paternal grandfather; Diabetes in her maternal grandmother and paternal grandmother; Early death in her brother and mother; HIV in her mother; Heart failure in her maternal grandfather; Hypertension in her father and maternal grandmother; Other in her mother; Stroke in her maternal grandmother. Social History:  Patient  reports that she has never smoked. She has never used smokeless tobacco. She reports current alcohol use of about 1.0 - 2.0 standard drinks of alcohol per week. She reports that she does not use drugs.  Review of Systems: Constitutional: Negative for fever malaise or anorexia Cardiovascular: negative for chest pain Respiratory: negative for SOB or persistent cough Gastrointestinal: negative for abdominal pain  Objective  Vitals: BP 122/82 (BP Location: Left Arm, Patient Position: Sitting, Cuff Size: Normal)   Pulse 72   Temp 98 F (36.7 C) (Temporal)   Ht 5\' 8"  (1.727 m)   Wt 218 lb (98.9 kg)   LMP 06/29/2019   SpO2 96%   BMI 33.15 kg/m  General: no acute distress , A&Ox3 Rectal: left lateral nontender hemorrhoid with central opening w/o active bleeding. No palpable clot. No drainage or pus.      Commons side effects, risks, benefits, and alternatives for medications and treatment plan prescribed today were discussed, and the patient expressed understanding of the given instructions. Patient is instructed  to call or message via MyChart if he/she has any questions or concerns regarding our treatment plan. No barriers to understanding were identified. We discussed Red Flag symptoms and signs in detail. Patient expressed understanding regarding what to do in case of urgent or emergency type symptoms.   Medication list was reconciled, printed and provided to the patient in AVS. Patient instructions and summary  information was reviewed with the patient as documented in the AVS. This note was prepared with assistance of Dragon voice recognition software. Occasional wrong-word or sound-a-like substitutions may have occurred due to the inherent limitations of voice recognition software

## 2019-07-29 NOTE — Patient Instructions (Addendum)
Please follow up if symptoms do not improve or as needed.  You may use OTC hydrocortisone cream on the gauze once or twice daily for the next week.    Hemorrhoids Hemorrhoids are swollen veins in and around the rectum or anus. There are two types of hemorrhoids:  Internal hemorrhoids. These occur in the veins that are just inside the rectum. They may poke through to the outside and become irritated and painful.  External hemorrhoids. These occur in the veins that are outside the anus and can be felt as a painful swelling or hard lump near the anus. Most hemorrhoids do not cause serious problems, and they can be managed with home treatments such as diet and lifestyle changes. If home treatments do not help the symptoms, procedures can be done to shrink or remove the hemorrhoids. What are the causes? This condition is caused by increased pressure in the anal area. This pressure may result from various things, including:  Constipation.  Straining to have a bowel movement.  Diarrhea.  Pregnancy.  Obesity.  Sitting for long periods of time.  Heavy lifting or other activity that causes you to strain.  Anal sex.  Riding a bike for a long period of time. What are the signs or symptoms? Symptoms of this condition include:  Pain.  Anal itching or irritation.  Rectal bleeding.  Leakage of stool (feces).  Anal swelling.  One or more lumps around the anus. How is this diagnosed? This condition can often be diagnosed through a visual exam. Other exams or tests may also be done, such as:  An exam that involves feeling the rectal area with a gloved hand (digital rectal exam).  An exam of the anal canal that is done using a small tube (anoscope).  A blood test, if you have lost a significant amount of blood.  A test to look inside the colon using a flexible tube with a camera on the end (sigmoidoscopy or colonoscopy). How is this treated? This condition can usually be treated  at home. However, various procedures may be done if dietary changes, lifestyle changes, and other home treatments do not help your symptoms. These procedures can help make the hemorrhoids smaller or remove them completely. Some of these procedures involve surgery, and others do not. Common procedures include:  Rubber band ligation. Rubber bands are placed at the base of the hemorrhoids to cut off their blood supply.  Sclerotherapy. Medicine is injected into the hemorrhoids to shrink them.  Infrared coagulation. A type of light energy is used to get rid of the hemorrhoids.  Hemorrhoidectomy surgery. The hemorrhoids are surgically removed, and the veins that supply them are tied off.  Stapled hemorrhoidopexy surgery. The surgeon staples the base of the hemorrhoid to the rectal wall. Follow these instructions at home: Eating and drinking   Eat foods that have a lot of fiber in them, such as whole grains, beans, nuts, fruits, and vegetables.  Ask your health care provider about taking products that have added fiber (fiber supplements).  Reduce the amount of fat in your diet. You can do this by eating low-fat dairy products, eating less red meat, and avoiding processed foods.  Drink enough fluid to keep your urine pale yellow. Managing pain and swelling   Take warm sitz baths for 20 minutes, 3-4 times a day to ease pain and discomfort. You may do this in a bathtub or using a portable sitz bath that fits over the toilet.  If directed, apply ice  to the affected area. Using ice packs between sitz baths may be helpful. ? Put ice in a plastic bag. ? Place a towel between your skin and the bag. ? Leave the ice on for 20 minutes, 2-3 times a day. General instructions  Take over-the-counter and prescription medicines only as told by your health care provider.  Use medicated creams or suppositories as told.  Get regular exercise. Ask your health care provider how much and what kind of exercise  is best for you. In general, you should do moderate exercise for at least 30 minutes on most days of the week (150 minutes each week). This can include activities such as walking, biking, or yoga.  Go to the bathroom when you have the urge to have a bowel movement. Do not wait.  Avoid straining to have bowel movements.  Keep the anal area dry and clean. Use wet toilet paper or moist towelettes after a bowel movement.  Do not sit on the toilet for long periods of time. This increases blood pooling and pain.  Keep all follow-up visits as told by your health care provider. This is important. Contact a health care provider if you have:  Increasing pain and swelling that are not controlled by treatment or medicine.  Difficulty having a bowel movement, or you are unable to have a bowel movement.  Pain or inflammation outside the area of the hemorrhoids. Get help right away if you have:  Uncontrolled bleeding from your rectum. Summary  Hemorrhoids are swollen veins in and around the rectum or anus.  Most hemorrhoids can be managed with home treatments such as diet and lifestyle changes.  Taking warm sitz baths can help ease pain and discomfort.  In severe cases, procedures or surgery can be done to shrink or remove the hemorrhoids. This information is not intended to replace advice given to you by your health care provider. Make sure you discuss any questions you have with your health care provider. Document Released: 09/01/2000 Document Revised: 09/12/2018 Document Reviewed: 01/24/2018 Elsevier Patient Education  2020 [employer].

## 2019-08-27 DIAGNOSIS — L821 Other seborrheic keratosis: Secondary | ICD-10-CM | POA: Diagnosis not present

## 2019-08-27 DIAGNOSIS — L918 Other hypertrophic disorders of the skin: Secondary | ICD-10-CM | POA: Diagnosis not present

## 2019-08-27 DIAGNOSIS — D239 Other benign neoplasm of skin, unspecified: Secondary | ICD-10-CM | POA: Diagnosis not present

## 2019-08-27 DIAGNOSIS — L111 Transient acantholytic dermatosis [Grover]: Secondary | ICD-10-CM | POA: Diagnosis not present

## 2019-09-01 ENCOUNTER — Telehealth: Payer: Self-pay | Admitting: *Deleted

## 2019-09-01 ENCOUNTER — Other Ambulatory Visit: Payer: Self-pay

## 2019-09-01 ENCOUNTER — Emergency Department (HOSPITAL_BASED_OUTPATIENT_CLINIC_OR_DEPARTMENT_OTHER)
Admission: EM | Admit: 2019-09-01 | Discharge: 2019-09-01 | Discharge status: Against Medical Advice | Payer: BC Managed Care – PPO | Attending: Emergency Medicine | Admitting: Emergency Medicine

## 2019-09-01 ENCOUNTER — Emergency Department (HOSPITAL_BASED_OUTPATIENT_CLINIC_OR_DEPARTMENT_OTHER): Payer: BC Managed Care – PPO

## 2019-09-01 ENCOUNTER — Encounter (HOSPITAL_BASED_OUTPATIENT_CLINIC_OR_DEPARTMENT_OTHER): Payer: Self-pay | Admitting: *Deleted

## 2019-09-01 ENCOUNTER — Ambulatory Visit: Payer: Self-pay

## 2019-09-01 DIAGNOSIS — R0789 Other chest pain: Secondary | ICD-10-CM | POA: Insufficient documentation

## 2019-09-01 DIAGNOSIS — Z79899 Other long term (current) drug therapy: Secondary | ICD-10-CM | POA: Diagnosis not present

## 2019-09-01 DIAGNOSIS — R079 Chest pain, unspecified: Secondary | ICD-10-CM

## 2019-09-01 DIAGNOSIS — N644 Mastodynia: Secondary | ICD-10-CM | POA: Diagnosis not present

## 2019-09-01 LAB — CBC WITH DIFFERENTIAL/PLATELET
Abs Immature Granulocytes: 0.02 10*3/uL (ref 0.00–0.07)
Basophils Absolute: 0.1 10*3/uL (ref 0.0–0.1)
Basophils Relative: 1 %
Eosinophils Absolute: 0.1 10*3/uL (ref 0.0–0.5)
Eosinophils Relative: 1 %
HCT: 43.8 % (ref 36.0–46.0)
Hemoglobin: 14.5 g/dL (ref 12.0–15.0)
Immature Granulocytes: 0 %
Lymphocytes Relative: 30 %
Lymphs Abs: 3.3 10*3/uL (ref 0.7–4.0)
MCH: 31.3 pg (ref 26.0–34.0)
MCHC: 33.1 g/dL (ref 30.0–36.0)
MCV: 94.4 fL (ref 80.0–100.0)
Monocytes Absolute: 0.6 10*3/uL (ref 0.1–1.0)
Monocytes Relative: 6 %
Neutro Abs: 7.1 10*3/uL (ref 1.7–7.7)
Neutrophils Relative %: 62 %
Platelets: 381 10*3/uL (ref 150–400)
RBC: 4.64 MIL/uL (ref 3.87–5.11)
RDW: 12.3 % (ref 11.5–15.5)
WBC: 11.1 10*3/uL — ABNORMAL HIGH (ref 4.0–10.5)
nRBC: 0 % (ref 0.0–0.2)

## 2019-09-01 LAB — COMPREHENSIVE METABOLIC PANEL
ALT: 36 U/L (ref 0–44)
AST: 26 U/L (ref 15–41)
Albumin: 4.7 g/dL (ref 3.5–5.0)
Alkaline Phosphatase: 83 U/L (ref 38–126)
Anion gap: 12 (ref 5–15)
BUN: 14 mg/dL (ref 6–20)
CO2: 23 mmol/L (ref 22–32)
Calcium: 8.9 mg/dL (ref 8.9–10.3)
Chloride: 101 mmol/L (ref 98–111)
Creatinine, Ser: 0.96 mg/dL (ref 0.44–1.00)
GFR calc Af Amer: >60 mL/min (ref >60)
GFR calc non Af Amer: >60 mL/min (ref >60)
Glucose, Bld: 109 mg/dL — ABNORMAL HIGH (ref 70–99)
Potassium: 3.2 mmol/L — ABNORMAL LOW (ref 3.5–5.1)
Sodium: 136 mmol/L (ref 135–145)
Total Bilirubin: 0.6 mg/dL (ref 0.3–1.2)
Total Protein: 8 g/dL (ref 6.5–8.1)

## 2019-09-01 LAB — PREGNANCY, URINE: Preg Test, Ur: NEGATIVE

## 2019-09-01 LAB — TROPONIN I (HIGH SENSITIVITY)
Troponin I (High Sensitivity): <2 ng/L (ref <18)
Troponin I (High Sensitivity): 2 ng/L (ref <18)

## 2019-09-01 MED ORDER — ASPIRIN 81 MG PO CHEW
324.0000 mg | CHEWABLE_TABLET | Freq: Once | ORAL | Status: AC
  Administered 2019-09-01: 324 mg via ORAL
  Filled 2019-09-01: qty 4

## 2019-09-01 MED ORDER — POTASSIUM CHLORIDE CRYS ER 20 MEQ PO TBCR
EXTENDED_RELEASE_TABLET | ORAL | Status: AC
  Filled 2019-09-01: qty 2

## 2019-09-01 MED ORDER — POTASSIUM CHLORIDE CRYS ER 20 MEQ PO TBCR
40.0000 meq | EXTENDED_RELEASE_TABLET | Freq: Once | ORAL | Status: AC
  Administered 2019-09-01: 40 meq via ORAL

## 2019-09-01 NOTE — ED Triage Notes (Signed)
Left breast pain 2 weeks ago. Soreness in her left breast has remained. Today she has had a sharp pain in her left axilla. Her mammogram was normal in October.

## 2019-09-01 NOTE — ED Notes (Signed)
ED Provider at bedside. 

## 2019-09-01 NOTE — Telephone Encounter (Signed)
Patient returned call. Per chart patient had been in touch with office and office wanted her to call back. Patient states that she has had a dull chest pain for about 2 weeks.  She states it is to the left side under her breast.  She first noticed the pain while out walking with her husband.  She states that it has come and gone over the day but today it has moved into her left shoulder and remained for 2 hours.  She has treated with OTC medication for pain but it has not helped.  She gives no Hx of injury.  She states she does have Hx of reflux but only notices that at night.  She has been working on cholesterol with weight loss and exercise. She denies other symptoms. Per protocol patient will go to ER for evaluation of her symptoms. Care advice read to patient.  She verbalized understanding and will have her husband drive. Note will be routed to office.  Reason for Disposition . Pain also in shoulder(s) or arm(s) or jaw  Answer Assessment - Initial Assessment Questions 1. LOCATION: "Where does it hurt?"       Left chest and shoulder 2. RADIATION: "Does the pain go anywhere else?" (e.g., into neck, jaw, arms, back)     no 3. ONSET: "When did the chest pain begin?" (Minutes, hours or days)      2 weeks ago while walking 4. PATTERN "Does the pain come and go, or has it been constant since it started?"  "Does it get worse with exertion?"      Comes and goes but shoulder pain is dull ache now 5. DURATION: "How long does it last" (e.g., seconds, minutes, hours)     2 hours 6. SEVERITY: "How bad is the pain?"  (e.g., Scale 1-10; mild, moderate, or severe)    - MILD (1-3): doesn't interfere with normal activities     - MODERATE (4-7): interferes with normal activities or awakens from sleep    - SEVERE (8-10): excruciating pain, unable to do any normal activities      3 7. CARDIAC RISK FACTORS: "Do you have any history of heart problems or risk factors for heart disease?" (e.g., angina, prior heart  attack; diabetes, high blood pressure, high cholesterol, smoker, or strong family history of heart disease)     HTN , stroke runs in family 90. PULMONARY RISK FACTORS: "Do you have any history of lung disease?"  (e.g., blood clots in lung, asthma, emphysema, birth control pills)    No has reflux 9. CAUSE: "What do you think is causing the chest pain?"     no 10. OTHER SYMPTOMS: "Do you have any other symptoms?" (e.g., dizziness, nausea, vomiting, sweating, fever, difficulty breathing, cough)      no 11. PREGNANCY: "Is there any chance you are pregnant?" "When was your last menstrual period?"      No, thanksgiving  Protocols used: CHEST PAIN-A-AH

## 2019-09-01 NOTE — Telephone Encounter (Signed)
Left message on voicemail to call office. Calling to triage chest pain and see if pt can come in tomorrow at 10:40.

## 2019-09-01 NOTE — ED Provider Notes (Signed)
[facility] EMERGENCY DEPARTMENT Provider Note   CSN: [id number] Arrival date & time: 09/01/19  1743     History Chief Complaint  Patient presents with  . Breast Pain    Susan [name] is a 43 y.o. female.  HPI     2 weeks ago developed sharp left sided chest/breast pain when walking, since then has had aching on left side of the chest, coming and going, not exertional, not positional, not worse with arm movements or deep breaths.  Reflux over last month has been more severe at night.  5 years ago had chest pain and thought it was heart burn-had ECG then, placed on PPI but has not been on that in a while.  No dyspnea, nausea, vomiting, sweating with the pain  Today felt more like a stabbing pain in shoulder, took tylenol which seemed to help some.  Requested appt from PCP but they recommended going to ED, has been waxing and waning not severe pain.  More of nuisance. Not worse with moving shoulder.    No leg pain or swelling, history of DVT/PE, not on OCPs, no long trips car or airplane  No family hx of CAD, CVA in family and htn  Cholesterol, diet controlled, on phentermine  No smoking, occ etoh, no other drugs  Past Medical History:  Diagnosis Date  . Abnormal pap 02/2003   CIN 2-3  . GERD (gastroesophageal reflux disease)   . Migraine with aura     Patient Active Problem List   Diagnosis Date Noted  . Class 1 obesity due to excess calories without serious comorbidity with body mass index (BMI) of 33.0 to 33.9 in adult 11/17/2017  . Hypercholesterolemia, not on statin 08/18/2017  . H/O abnormal cervical Papanicolaou smear 01/06/2013    Past Surgical History:  Procedure Laterality Date  . CERVICAL BIOPSY  W/ LOOP ELECTRODE EXCISION  02/2003   CIN 2-3  . CHOLECYSTECTOMY  2015  . COSMETIC SURGERY  2009     OB History    Gravida  0   Para  0   Term      Preterm      AB      Living        SAB      TAB      Ectopic      Multiple       Live Births              Family History  Problem Relation Age of Onset  . Hypertension Father   . Depression Father        Suicide  . Hypertension Maternal Grandmother   . Diabetes Maternal Grandmother   . Stroke Maternal Grandmother   . Heart failure Maternal Grandfather   . Diabetes Paternal Grandmother   . HIV Mother   . Other Mother   . Early death Mother        Age 50  . Depression Paternal Grandfather        Suicide  . Early death Brother        Age 42    Social History   Tobacco Use  . Smoking status: Never Smoker  . Smokeless tobacco: Never Used  Substance Use Topics  . Alcohol use: Yes    Alcohol/week: 1.0 - 2.0 standard drinks    Types: 1 - 2 Glasses of wine per week    Comment: 1-2 glasses/week  . Drug use: No    Home Medications Prior  to Admission medications   Medication Sig Start Date End Date Taking? Authorizing Provider  desoximetasone (TOPICORT) 0.25 % cream Use 1 X daily prn. But not long term 11/16/17   Briscoe Deutscher, DO  Lidocaine-Glycerin (PREPARATION H EX) Apply 1 application topically as needed.    [provider]  Multiple Vitamins-Minerals (MULTIVITAMIN PO) Take by mouth daily.    [provider]  phentermine (ADIPEX-P) 37.5 MG tablet One po in the morning 07/02/19   [name], PA  traZODone (DESYREL) 50 MG tablet Take 0.5-1 tablets (25-50 mg total) by mouth at bedtime as needed for sleep. 07/21/19   [name], PA  triamcinolone ointment (KENALOG) 0.5 % Apply to affected area 1-2 times daily, not long term 07/21/19   [name], PA    Allergies    Patient has no known allergies.  Review of Systems   Review of Systems  Constitutional: Negative for fever.  HENT: Negative for sore throat.   Eyes: Negative for visual disturbance.  Respiratory: Negative for cough, shortness of breath and wheezing.   Cardiovascular: Positive for chest pain. Negative for palpitations and leg swelling.    Gastrointestinal: Negative for abdominal pain, nausea and vomiting.  Genitourinary: Negative for difficulty urinating.  Musculoskeletal: Positive for arthralgias. Negative for back pain and neck pain.  Skin: Negative for rash.  Neurological: Negative for syncope, light-headedness and headaches.    Physical Exam Updated Vital Signs BP 132/88 (BP Location: Left Arm)   Pulse 62   Temp 98.3 F (36.8 C) (Oral)   Resp 16   Ht 5\' 7"  (1.702 m)   Wt 97.5 kg   LMP 08/15/2019   SpO2 100%   BMI 33.67 kg/m   Physical Exam Vitals and nursing note reviewed.  Constitutional:      General: She is not in acute distress.    Appearance: She is well-developed. She is not diaphoretic.  HENT:     Head: Normocephalic and atraumatic.  Eyes:     Conjunctiva/sclera: Conjunctivae normal.  Cardiovascular:     Rate and Rhythm: Normal rate and regular rhythm.     Heart sounds: Normal heart sounds. No murmur. No friction rub. No gallop.   Pulmonary:     Effort: Pulmonary effort is normal. No respiratory distress.     Breath sounds: Normal breath sounds. No wheezing or rales.  Abdominal:     General: There is no distension.     Palpations: Abdomen is soft.     Tenderness: There is no abdominal tenderness. There is no guarding.  Musculoskeletal:        General: No tenderness.     Cervical back: Normal range of motion.  Skin:    General: Skin is warm and dry.     Findings: No erythema or rash.  Neurological:     Mental Status: She is alert and oriented to person, place, and time.     ED Results / Procedures / Treatments   Labs (all labs ordered are listed, but only abnormal results are displayed) Labs Reviewed  CBC WITH DIFFERENTIAL/PLATELET - Abnormal; Notable for the following components:      Result Value   WBC 11.1 (*)    All other components within normal limits  COMPREHENSIVE METABOLIC PANEL - Abnormal; Notable for the following components:   Potassium 3.2 (*)    Glucose, Bld 109  (*)    All other components within normal limits  PREGNANCY, URINE  TROPONIN I (HIGH SENSITIVITY)  TROPONIN I (HIGH SENSITIVITY)  EKG EKG Interpretation  Date/Time:  Monday September 01 2019 18:00:33 EST Ventricular Rate:  87 PR Interval:  154 QRS Duration: 88 QT Interval:  372 QTC Calculation: 447 R Axis:   17 Text Interpretation: Normal sinus rhythm with sinus arrhythmia Low voltage QRS Cannot rule out Anterior infarct , age undetermined Abnormal ECG No previous ECGs available Confirmed by Gareth Morgan [phone]) on 09/01/2019 6:11:19 PM   Radiology DG Chest 2 View  Result Date: 09/01/2019 CLINICAL DATA:  Left breast pain x2 weeks. EXAM: CHEST - 2 VIEW COMPARISON:  None. FINDINGS: The heart size and mediastinal contours are within normal limits. Both lungs are clear. The visualized skeletal structures are unremarkable. There is a questionable density overlying the right lung apex that is favored to be secondary to a confluence of shadows from the anterior right first rib and medial clavicle. IMPRESSION: No active cardiopulmonary disease. Electronically Signed   By: Constance Holster M.D.   On: 09/01/2019 19:11    Procedures Procedures (including critical care time)  Medications Ordered in ED Medications  aspirin chewable tablet 324 mg (324 mg Oral Given 09/01/19 1905)  potassium chloride SA (KLOR-CON) CR tablet 40 mEq (40 mEq Oral Given 09/01/19 2119)    ED Course  I have reviewed the triage vital signs and the nursing notes.  Pertinent labs & imaging results that were available during my care of the patient were reviewed by me and considered in my medical decision making (see chart for details).    MDM Rules/Calculators/A&P                      43yo female with history of hypercholesterolemia presents with concern for chest pain. Differential diagnosis for chest pain includes pulmonary embolus, dissection, pneumothorax, pneumonia, ACS, myocarditis, pericarditis.   EKG was done and evaluate by me and showed no acute ST changes and no signs of pericarditis. Chest x-ray was done and evaluated by me and radiology and showed no sign of pneumonia or pneumothorax. Patient is PERC negative and low risk [place] and have low suspicion for PE.  Normal bilateral upper and lower extremity pulses and blood pressures, low suspicion for acute aortic dissection by history and exam.  Patient is low risk HEART score, initial troponin is negative.  Hx not consistent with breast abscess.  Patient would like to leave prior to results of second troponin.  Discussed that without second value, we cannot rule out ACS at this time and discussed there is associated risk of leaving prior to this including death, disability from undiagnosed heart attack. She states understanding. Would like to be called if value abnormal.   Second troponin negative. No signs of ACS. Recommend PCP follow up.     Final Clinical Impression(s) / ED Diagnoses Final diagnoses:  Chest pain, unspecified type    Rx / DC Orders ED Discharge Orders    None       Gareth Morgan, MD 09/02/19 1005

## 2019-09-02 NOTE — Telephone Encounter (Signed)
See note

## 2019-09-04 ENCOUNTER — Other Ambulatory Visit: Payer: Self-pay

## 2019-09-04 ENCOUNTER — Encounter: Payer: Self-pay | Admitting: Physician Assistant

## 2019-09-04 ENCOUNTER — Ambulatory Visit (INDEPENDENT_AMBULATORY_CARE_PROVIDER_SITE_OTHER): Payer: BC Managed Care – PPO | Admitting: Physician Assistant

## 2019-09-04 VITALS — BP 126/80 | HR 75 | Temp 98.0°F | Ht 67.0 in | Wt 217.4 lb

## 2019-09-04 DIAGNOSIS — R079 Chest pain, unspecified: Secondary | ICD-10-CM

## 2019-09-04 DIAGNOSIS — E876 Hypokalemia: Secondary | ICD-10-CM

## 2019-09-04 LAB — BASIC METABOLIC PANEL
BUN: 11 mg/dL (ref 6–23)
CO2: 27 mEq/L (ref 19–32)
Calcium: 9.4 mg/dL (ref 8.4–10.5)
Chloride: 106 mEq/L (ref 96–112)
Creatinine, Ser: 0.89 mg/dL (ref 0.40–1.20)
GFR: 68.74 mL/min (ref >60.00)
Glucose, Bld: 109 mg/dL — ABNORMAL HIGH (ref 70–99)
Potassium: 3.7 mEq/L (ref 3.5–5.1)
Sodium: 138 mEq/L (ref 135–145)

## 2019-09-04 NOTE — Progress Notes (Signed)
Susan [name] is a 43 y.o. female is here to follow up from ED visit  I acted as a Education administrator for [employer], PA-C Anselmo Pickler, LPN  History of Present Illness:   Chief Complaint  Patient presents with  . Follow-up    HPI   ED f/u for chest pain Pt was seen in the ED on 12/14 for left sided chest pain. At that time she reported a 2-week history of sharp l-sided chest/breast pain when walking. Denied: positional effect, exertional effect, worse with movement or deep inspiration, dyspnea, n/v/, diaphoresis, leg pain/swelling, hx of DVT, recent immobility. Endorsed worsening GERD.   Work-up in the ED revealed EKG without acute changes, negative CXR, negative troponins, slightly low K of 3.2, slightly elevated WBC of 11.1.  Since that time: Pt states pain is a lot better, still having twinges off and on. Shoulder pain has resolved. Does report a history of slight triceps tendon tear about 2 years ago on the left side, doesn't know if this contributing to her symptoms. She denies weakness or significant pain in upper or lower L arm.  For her low potassium in the hospital she was given two potassium pills and has since bought a MVI with potassium and started taking this.  I personally reviewed the xray, lab results and ED notes during this visit.  There are no preventive care reminders to display for this patient.  Past Medical History:  Diagnosis Date  . Abnormal pap 02/2003   CIN 2-3  . GERD (gastroesophageal reflux disease)   . Migraine with aura      Social History   Socioeconomic History  . Marital status: Married    Spouse name: Not on file  . Number of children: 0  . Years of education: Not on file  . Highest education level: Not on file  Occupational History    Employer: [employer]  Tobacco Use  . Smoking status: Never Smoker  . Smokeless tobacco: Never Used  Substance and Sexual Activity  . Alcohol use: Yes    Alcohol/week: 1.0 - 2.0 standard drinks      Types: 1 - 2 Glasses of wine per week    Comment: 1-2 glasses/week  . Drug use: No  . Sexual activity: Yes    Partners: Male    Birth control/protection: Condom  Other Topics Concern  . 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 Difficulty of Paying Living Expenses: Not on file  Food Insecurity:   . Worried About Charity fundraiser in the Last Year: Not on file  . Ran Out of Food in the Last Year: Not on file  Transportation Needs:   . Lack of Transportation (Medical): Not on file  . Lack of Transportation (Non-Medical): Not on file  Physical Activity:   . Days of Exercise per Week: Not on file  . Minutes of Exercise per Session: Not on file  Stress:   . Feeling of Stress : Not on file  Social Connections:   . Frequency of Communication with Friends and Family: Not on file  . Frequency of Social Gatherings with Friends and Family: Not on file  . Attends Religious Services: Not on file  . Active Member of Clubs or Organizations: Not on file  . Attends Archivist Meetings: Not on file  . Marital Status: Not on  file  Intimate Partner Violence:   . Fear of Current or Ex-Partner: Not on file  . Emotionally Abused: Not on file  . Physically Abused: Not on file  . Sexually Abused: Not on file    Past Surgical History:  Procedure Laterality Date  . CERVICAL BIOPSY  W/ LOOP ELECTRODE EXCISION  02/2003   CIN 2-3  . CHOLECYSTECTOMY  2015  . COSMETIC SURGERY  2009    Family History  Problem Relation Age of Onset  . Hypertension Father   . Depression Father        Suicide  . Hypertension Maternal Grandmother   . Diabetes Maternal Grandmother   . Stroke Maternal Grandmother   . Heart failure Maternal Grandfather   . Diabetes Paternal Grandmother   . HIV Mother   . Other Mother   . Early death Mother        Age 45  . Depression Paternal  Grandfather        Suicide  . Early death Brother        Age 34    PMHx, SurgHx, SocialHx, FamHx, Medications, and Allergies were reviewed in the Visit Navigator and updated as appropriate.   Patient Active Problem List   Diagnosis Date Noted  . Class 1 obesity due to excess calories without serious comorbidity with body mass index (BMI) of 33.0 to 33.9 in adult 11/17/2017  . Hypercholesterolemia, not on statin 08/18/2017  . H/O abnormal cervical Papanicolaou smear 01/06/2013    Social History   Tobacco Use  . Smoking status: Never Smoker  . Smokeless tobacco: Never Used  Substance Use Topics  . Alcohol use: Yes    Alcohol/week: 1.0 - 2.0 standard drinks    Types: 1 - 2 Glasses of wine per week    Comment: 1-2 glasses/week  . Drug use: No    Current Medications and Allergies:    Current Outpatient Medications:  .  desoximetasone (TOPICORT) 0.25 % cream, Use 1 X daily prn. But not long term, Disp: 30 g, Rfl: 2 .  Lidocaine-Glycerin (PREPARATION H EX), Apply 1 application topically as needed., Disp: , Rfl:  .  Multiple Vitamins-Minerals (MULTIVITAMIN PO), Take by mouth daily., Disp: , Rfl:  .  phentermine (ADIPEX-P) 37.5 MG tablet, One po in the morning, Disp: 30 tablet, Rfl: 2 .  traZODone (DESYREL) 50 MG tablet, Take 0.5-1 tablets (25-50 mg total) by mouth at bedtime as needed for sleep., Disp: 30 tablet, Rfl: 3 .  triamcinolone ointment (KENALOG) 0.5 %, Apply to affected area 1-2 times daily, not long term, Disp: 15 g, Rfl: 0  No Known Allergies  Review of Systems   ROS Negative unless otherwise specified per HPI.  Vitals:   Vitals:   09/04/19 1003  BP: 126/80  Pulse: 75  Temp: 98 F (36.7 C)  TempSrc: Temporal  SpO2: 97%  Weight: 217 lb 6.1 oz (98.6 kg)  Height: 5\' 7"  (1.702 m)     Body mass index is 34.05 kg/m.   Physical Exam:    Physical Exam Vitals and nursing note reviewed.  Constitutional:      General: She is not in acute distress.     Appearance: She is well-developed. She is not ill-appearing or toxic-appearing.  Cardiovascular:     Rate and Rhythm: Normal rate and regular rhythm.     Pulses: Normal pulses.     Heart sounds: Normal heart sounds, S1 normal and S2 normal.     Comments: No LE edema Pulmonary:  Effort: Pulmonary effort is normal.     Breath sounds: Normal breath sounds.  Musculoskeletal:     Comments: L upper arm with normal ROM No tenderness to palpation  Skin:    General: Skin is warm and dry.  Neurological:     Mental Status: She is alert.     GCS: GCS eye subscore is 4. GCS verbal subscore is 5. GCS motor subscore is 6.     Comments: Normal grip strength b/l  Psychiatric:        Speech: Speech normal.        Behavior: Behavior normal. Behavior is cooperative.      Assessment and Plan:    Brynleigh was seen today for follow-up.  Diagnoses and all orders for this visit:  Hypokalemia Will recheck labs today and recommend further intervention based on lab results. -     Basic metabolic panel  Left-sided chest pain Clinically improving. Will hold phentermine for 1 week and see if this makes any difference. If no improvement, will send to Dr. Lynne Leader in sports med for second opinion. Worsening precautions advised in the interim.  *I did call [facility] to discuss her CXR finding and was told that there is no significant concern regarding her "questionable density overlying the R lung apex."  Reviewed expectations re: course of current medical issues. . Discussed self-management of symptoms. . Outlined signs and symptoms indicating need for more acute intervention. . Patient verbalized understanding and all questions were answered. . See orders for this visit as documented in the electronic medical record. . Patient received an After Visit Summary.  CMA or LPN served as scribe during this visit. History, Physical, and Plan performed by medical provider. The above documentation has  been reviewed and is accurate and complete.   [name], PA-C [place] [place] 09/04/2019  Follow-up: No follow-ups on file.

## 2019-09-04 NOTE — Patient Instructions (Signed)
It was great to see you!  Hold the phentermine for a week. Let me know if this does anything for your pain.  I will be in touch regarding your labs and what the radiologist says.  If no improvement, lets get you to sports medicine.   Take care,  [name] PA-C

## 2019-10-13 DIAGNOSIS — L814 Other melanin hyperpigmentation: Secondary | ICD-10-CM | POA: Diagnosis not present

## 2019-10-13 DIAGNOSIS — L821 Other seborrheic keratosis: Secondary | ICD-10-CM | POA: Diagnosis not present

## 2019-10-13 DIAGNOSIS — L578 Other skin changes due to chronic exposure to nonionizing radiation: Secondary | ICD-10-CM | POA: Diagnosis not present

## 2019-10-13 DIAGNOSIS — D485 Neoplasm of uncertain behavior of skin: Secondary | ICD-10-CM | POA: Diagnosis not present

## 2019-10-13 DIAGNOSIS — C44519 Basal cell carcinoma of skin of other part of trunk: Secondary | ICD-10-CM | POA: Diagnosis not present

## 2019-10-13 DIAGNOSIS — D225 Melanocytic nevi of trunk: Secondary | ICD-10-CM | POA: Diagnosis not present

## 2019-11-05 DIAGNOSIS — C44519 Basal cell carcinoma of skin of other part of trunk: Secondary | ICD-10-CM | POA: Diagnosis not present

## 2019-12-30 ENCOUNTER — Other Ambulatory Visit: Payer: Self-pay | Admitting: Physician Assistant

## 2019-12-30 DIAGNOSIS — E6609 Other obesity due to excess calories: Secondary | ICD-10-CM

## 2019-12-31 NOTE — Telephone Encounter (Signed)
Patient requesting refill for Phentermine. Please schedule a f/u with [name], PA. Thanks

## 2019-12-31 NOTE — Telephone Encounter (Signed)
Left a voicemail for patient to call the office back to get scheduled.

## 2019-12-31 NOTE — Telephone Encounter (Signed)
LAST APPOINTMENT DATE: 09/04/2019  NEXT APPOINTMENT DATE:@Visit  date not found   LAST REFILL: 07/02/2019  QTY: 30 TABLET

## 2020-01-22 ENCOUNTER — Other Ambulatory Visit: Payer: Self-pay | Admitting: Physician Assistant

## 2020-02-04 ENCOUNTER — Other Ambulatory Visit: Payer: Self-pay | Admitting: Physician Assistant

## 2020-03-15 ENCOUNTER — Ambulatory Visit (INDEPENDENT_AMBULATORY_CARE_PROVIDER_SITE_OTHER)
Admission: RE | Admit: 2020-03-15 | Discharge: 2020-03-15 | Disposition: A | Discharge status: Home / Self Care | Payer: BC Managed Care – PPO | Source: Ambulatory Visit

## 2020-03-15 DIAGNOSIS — M545 Low back pain, unspecified: Secondary | ICD-10-CM

## 2020-03-15 MED ORDER — CYCLOBENZAPRINE HCL 10 MG PO TABS: 10.0000 mg | ORAL_TABLET | Freq: Two times a day (BID) | ORAL | 0 refills | Status: DC | PRN

## 2020-03-15 NOTE — Discharge Instructions (Signed)
Take the muscle relaxer Flexeril as needed for muscle spasm; do not drive, operate machinery, or drink alcohol with this medication as it may make you drowsy.    Follow up with your primary care provider or come here to be seen in person if your symptoms are not improving.

## 2020-03-15 NOTE — ED Provider Notes (Signed)
Virtual Visit via Video Note:  Susan [name]  initiated request for Telemedicine visit with [facility] team. I connected with Susan [name]  on 03/15/2020 at 2:37 PM  for a synchronized telemedicine visit using a video enabled HIPPA compliant telemedicine application. I verified that I am speaking with Susan [name]  using two identifiers. Sharion Balloon, NP  was physically located in a [facility] site and Susan [name] was located at a different location.   The limitations of evaluation and management by telemedicine as well as the availability of in-person appointments were discussed. Patient was informed that she  may incur a bill ( including co-pay) for this virtual visit encounter. Susan [name]  expressed understanding and gave verbal consent to proceed with virtual visit.     History of Present Illness:Susan [name]  is a 44 y.o. female presents for evaluation of bilateral "lower back strain" which occurred on 03/10/2020 while moving a flower pot.  The pain is worse with sitting; better with standing.  Non-radiating. She denies paresthesias, numbness, weakness in LE, abdominal pain, dysuria, bowel/bladder incontinence, or other symptoms.  Treatment attempted at home with Aleve.     No Known Allergies   Past Medical History:  Diagnosis Date  . Abnormal pap 02/2003   CIN 2-3  . GERD (gastroesophageal reflux disease)   . Migraine with aura      Social History   Tobacco Use  . Smoking status: Never Smoker  . Smokeless tobacco: Never Used  Substance Use Topics  . Alcohol use: Yes    Alcohol/week: 1.0 - 2.0 standard drink    Types: 1 - 2 Glasses of wine per week    Comment: 1-2 glasses/week  . Drug use: No    ROS: as stated in HPI.  All other systems reviewed and negative.     Observations/Objective: Physical Exam  VITALS: Patient denies fever. GENERAL: Alert, appears well and in no acute distress. HEENT:  Atraumatic. Oral mucosa appears moist. NECK: Normal movements of the head and neck. CARDIOPULMONARY: No increased WOB. Speaking in clear sentences. I:E ratio WNL.  MS: Moves all visible extremities without noticeable abnormality. PSYCH: Pleasant and cooperative, well-groomed. Speech normal rate and rhythm. Affect is appropriate. Insight and judgement are appropriate. Attention is focused, linear, and appropriate.  NEURO: CN grossly intact. Oriented as arrived to appointment on time with no prompting. Moves both UE equally.  SKIN: No obvious lesions, wounds, erythema, or cyanosis noted on face or hands.   Assessment and Plan:    ICD-10-CM   1. Acute bilateral low back pain without sciatica  M54.5        Follow Up Instructions: Treating with Flexeril.  Precautions for drowsiness with this medication discussed with patient.  Instructed her to continue taking Aleve.  Instructed her to follow-up with her PCP or come here to be seen in person if her symptoms are not improving.  Patient agrees to plan of care.      I discussed the assessment and treatment plan with the patient. The patient was provided an opportunity to ask questions and all were answered. The patient agreed with the plan and demonstrated an understanding of the instructions.   The patient was advised to call back or seek an in-person evaluation if the symptoms worsen or if the condition fails to improve as anticipated.      Sharion Balloon, NP  03/15/2020 2:37 PM  Sharion Balloon, NP 03/15/20 [phone]

## 2020-04-27 ENCOUNTER — Other Ambulatory Visit: Payer: Self-pay | Admitting: Physician Assistant

## 2020-04-27 MED ORDER — DESOXIMETASONE 0.25 % EX CREA: TOPICAL_CREAM | CUTANEOUS | 2 refills | Status: DC

## 2020-04-27 NOTE — Telephone Encounter (Signed)
.   LAST APPOINTMENT DATE: 02/04/2020   NEXT APPOINTMENT DATE:@Visit  date not found  MEDICATION:desoximetasone (TOPICORT) 0.25 % cream  PHARMACY:[facility] [place], [place]  **Let patient know to contact pharmacy at the end of the day to make sure medication is ready. **  ** Please notify patient to allow 48-72 hours to process**  **Encourage patient to contact the pharmacy for refills or they can request refills through [facility]**  CLINICAL FILLS OUT ALL BELOW:   LAST REFILL:  QTY:  REFILL DATE:    OTHER COMMENTS:    Okay for refill?  Please advise

## 2020-04-27 NOTE — Telephone Encounter (Signed)
Pt requesting refill. Last filled by Dr. Juleen China.  LAST APPOINTMENT DATE: 09/04/2019   NEXT APPOINTMENT DATE: Visit date not found    LAST REFILL: 11/16/2017  QTY: 30 g

## 2020-06-17 ENCOUNTER — Other Ambulatory Visit (HOSPITAL_BASED_OUTPATIENT_CLINIC_OR_DEPARTMENT_OTHER): Payer: Self-pay | Admitting: Physician Assistant

## 2020-06-17 DIAGNOSIS — Z1231 Encounter for screening mammogram for malignant neoplasm of breast: Secondary | ICD-10-CM

## 2020-07-05 ENCOUNTER — Encounter (HOSPITAL_BASED_OUTPATIENT_CLINIC_OR_DEPARTMENT_OTHER): Payer: BC Managed Care – PPO

## 2020-07-06 ENCOUNTER — Encounter (HOSPITAL_BASED_OUTPATIENT_CLINIC_OR_DEPARTMENT_OTHER): Payer: Self-pay

## 2020-07-06 ENCOUNTER — Other Ambulatory Visit: Payer: Self-pay

## 2020-07-06 ENCOUNTER — Ambulatory Visit (HOSPITAL_BASED_OUTPATIENT_CLINIC_OR_DEPARTMENT_OTHER)
Admission: RE | Admit: 2020-07-06 | Discharge: 2020-07-06 | Disposition: A | Discharge status: Home / Self Care | Payer: BC Managed Care – PPO | Source: Ambulatory Visit | Attending: Physician Assistant | Admitting: Physician Assistant

## 2020-07-06 DIAGNOSIS — Z1231 Encounter for screening mammogram for malignant neoplasm of breast: Secondary | ICD-10-CM | POA: Diagnosis not present

## 2020-07-21 ENCOUNTER — Other Ambulatory Visit: Payer: Self-pay

## 2020-07-21 ENCOUNTER — Ambulatory Visit (INDEPENDENT_AMBULATORY_CARE_PROVIDER_SITE_OTHER): Payer: BC Managed Care – PPO | Admitting: Physician Assistant

## 2020-07-21 ENCOUNTER — Encounter: Payer: Self-pay | Admitting: Physician Assistant

## 2020-07-21 ENCOUNTER — Other Ambulatory Visit (HOSPITAL_COMMUNITY)
Admission: RE | Admit: 2020-07-21 | Discharge: 2020-07-21 | Disposition: A | Discharge status: Home / Self Care | Payer: BC Managed Care – PPO | Source: Ambulatory Visit | Attending: Physician Assistant | Admitting: Physician Assistant

## 2020-07-21 VITALS — BP 120/88 | HR 78 | Temp 98.1°F | Ht 68.0 in | Wt 215.2 lb

## 2020-07-21 DIAGNOSIS — Z0000 Encounter for general adult medical examination without abnormal findings: Secondary | ICD-10-CM | POA: Diagnosis not present

## 2020-07-21 DIAGNOSIS — Z1211 Encounter for screening for malignant neoplasm of colon: Secondary | ICD-10-CM

## 2020-07-21 DIAGNOSIS — Z1159 Encounter for screening for other viral diseases: Secondary | ICD-10-CM

## 2020-07-21 DIAGNOSIS — E669 Obesity, unspecified: Secondary | ICD-10-CM

## 2020-07-21 DIAGNOSIS — Z124 Encounter for screening for malignant neoplasm of cervix: Secondary | ICD-10-CM | POA: Insufficient documentation

## 2020-07-21 DIAGNOSIS — G4700 Insomnia, unspecified: Secondary | ICD-10-CM

## 2020-07-21 DIAGNOSIS — M545 Low back pain, unspecified: Secondary | ICD-10-CM

## 2020-07-21 DIAGNOSIS — G8929 Other chronic pain: Secondary | ICD-10-CM

## 2020-07-21 DIAGNOSIS — E7800 Pure hypercholesterolemia, unspecified: Secondary | ICD-10-CM

## 2020-07-21 MED ORDER — TRAZODONE HCL 100 MG PO TABS
100.0000 mg | ORAL_TABLET | Freq: Every day | ORAL | 1 refills | Status: DC
Start: 2020-07-21 — End: 2021-01-17

## 2020-07-21 NOTE — Progress Notes (Signed)
I acted as a Education administrator for [employer], PA-C Anselmo Pickler, LPN    Subjective:    Susan [name] is a 44 y.o. female and is here for a comprehensive physical exam.   HPI  Health Maintenance Due  Topic Date Due  . Hepatitis C Screening  Never done  . COVID-19 Vaccine (1) Never done  . PAP SMEAR-Modifier  06/11/2020    Acute Concerns:   Chronic Issues: Obesity -- let the phentermine lapse, has issues with feeling motivated to exercise and make healthy food choices. R lower back pain -- had to go to ER in June for this. Symptoms improved but does have nagging R low back pain, feels like she needs an adjustment, would like to go to a chiropractor. HLD -- history of this on prior labs; never been on statin.  Health Maintenance: Immunizations -- UTD Colonoscopy -- N/A Mammogram -- UTD PAP -- doing today Bone Density -- N/A Diet -- does get a lot of takeout Sleep habits -- very light sleeper -- trialed the trazodone and got up to 50 mg and was not super effective, she is interested in trialing higher dose. Exercise -- limited Weight -- Weight: 215 lb 4 oz (97.6 kg)  Mood -- overall stable Weight history: Wt Readings from Last 10 Encounters:  07/21/20 215 lb 4 oz (97.6 kg)  09/04/19 217 lb 6.1 oz (98.6 kg)  09/01/19 215 lb (97.5 kg)  07/29/19 218 lb (98.9 kg)  07/21/19 220 lb 8 oz (100 kg)  07/02/19 215 lb (97.5 kg)  06/26/18 209 lb (94.8 kg)  02/19/18 206 lb (93.4 kg)  02/13/18 208 lb 3.2 oz (94.4 kg)  02/06/18 204 lb 3.2 oz (92.6 kg)   Body mass index is 32.73 kg/m. Patient's last menstrual period was 06/20/2020. Alcohol use: 1-2 glasses/week Tobacco use: none  Depression screen PHQ 2/9 07/21/2020  Decreased Interest 0  Down, Depressed, Hopeless 0  PHQ - 2 Score 0   UTD with dentist and eye doctor  Other providers/specialists: Patient Care Team: [name], [place] as PCP - General (Physician Assistant)    PMHx, SurgHx, SocialHx, Medications,  and Allergies were reviewed in the Visit Navigator and updated as appropriate.   Past Medical History:  Diagnosis Date  . Abnormal pap 02/2003   CIN 2-3  . GERD (gastroesophageal reflux disease)   . Migraine with aura      Past Surgical History:  Procedure Laterality Date  . CERVICAL BIOPSY  W/ LOOP ELECTRODE EXCISION  02/2003   CIN 2-3  . CHOLECYSTECTOMY  2015  . COSMETIC SURGERY  2009     Family History  Problem Relation Age of Onset  . Hypertension Father   . Depression Father        Suicide  . Hypertension Maternal Grandmother   . Diabetes Maternal Grandmother   . Stroke Maternal Grandmother   . Heart failure Maternal Grandfather   . Diabetes Paternal Grandmother   . HIV Mother   . Other Mother   . Early death Mother        Age 17  . Depression Paternal Grandfather        Suicide  . Early death Brother        Age 50    Social History   Tobacco Use  . Smoking status: Never Smoker  . Smokeless tobacco: Never Used  Substance Use Topics  . Alcohol use: Yes    Alcohol/week: 1.0 - 2.0 standard drink  Types: 1 - 2 Glasses of wine per week    Comment: 1-2 glasses/week  . Drug use: No    Review of Systems:   Review of Systems  Constitutional: Negative for chills, fever, malaise/fatigue and weight loss.  HENT: Negative for hearing loss, sinus pain and sore throat.   Eyes: Negative for blurred vision.  Respiratory: Negative for cough and shortness of breath.   Cardiovascular: Negative for chest pain, palpitations and leg swelling.  Gastrointestinal: Negative for abdominal pain, constipation, diarrhea, heartburn, nausea and vomiting.  Genitourinary: Negative for dysuria, frequency and urgency.  Musculoskeletal: Positive for back pain. Negative for myalgias and neck pain.  Skin: Negative for itching and rash.  Neurological: Negative for dizziness, tingling, seizures, loss of consciousness and headaches.  Endo/Heme/Allergies: Negative for polydipsia.   Psychiatric/Behavioral: Negative for depression. The patient has insomnia. The patient is not nervous/anxious.   All other systems reviewed and are negative.   Objective:   BP 120/88 (BP Location: Left Arm, Patient Position: Sitting, Cuff Size: Large)   Pulse 78   Temp 98.1 F (36.7 C) (Temporal)   Ht 5\' 8"  (1.727 m)   Wt 215 lb 4 oz (97.6 kg)   LMP 06/20/2020   SpO2 98%   BMI 32.73 kg/m  Body mass index is 32.73 kg/m.   General Appearance:    Alert, cooperative, no distress, appears stated age  Head:    Normocephalic, without obvious abnormality, atraumatic  Eyes:    PERRL, conjunctiva/corneas clear, EOM's intact, fundi    benign, both eyes  Ears:    Normal TM's and external ear canals, both ears  Nose:   Nares normal, septum midline, mucosa normal, no drainage    or sinus tenderness  Throat:   Lips, mucosa, and tongue normal; teeth and gums normal  Neck:   Supple, symmetrical, trachea midline, no adenopathy;    thyroid:  no enlargement/tenderness/nodules; no carotid   bruit or JVD  Back:     Symmetric, no curvature, ROM normal, no CVA tenderness  Lungs:     Clear to auscultation bilaterally, respirations unlabored  Chest Wall:    No tenderness or deformity   Heart:    Regular rate and rhythm, S1 and S2 normal, no murmur, rub or gallop  Breast Exam:    Deferred  Abdomen:     Soft, non-tender, bowel sounds active all four quadrants,    no masses, no organomegaly  Genitalia:    Normal female without lesion, discharge or tenderness  Extremities:   Extremities normal, atraumatic, no cyanosis or edema  Pulses:   2+ and symmetric all extremities  Skin:   Skin color, texture, turgor normal, no rashes or lesions  Lymph nodes:   Cervical, supraclavicular, and axillary nodes normal  Neurologic:   CNII-XII intact, normal strength, sensation and reflexes    throughout     Assessment/Plan:   Elayjah was seen today for annual exam.  Diagnoses and all orders for this  visit:  Routine physical examination Today patient counseled on age appropriate routine health concerns for screening and prevention, each reviewed and up to date or declined. Immunizations reviewed and up to date or declined. Labs ordered and reviewed. Risk factors for depression reviewed and negative. Hearing function and visual acuity are intact. ADLs screened and addressed as needed. Functional ability and level of safety reviewed and appropriate. Education, counseling and referrals performed based on assessed risks today. Patient provided with a copy of personalized plan for preventive services.  Insomnia, unspecified  type Uncontrolled. Increase Trazodone to 100 mg daily. Follow-up as needed.  Obesity (BMI 30-39.9) Encouraged better care of self and making time for exercise/healthy eating. -     CBC with Differential/Platelet; Future -     Comprehensive metabolic panel; Future  Hypercholesterolemia Update labs today and determine need for intervention. -     Lipid panel; Future  Colon cancer screening Agreeable to colonoscopy -- will put in referral  Back pain Referral to sports medicine with Charlann Boxer for OMT.  Encounter for screening for other viral diseases -     Hepatitis C antibody; Future  Pap smear for cervical cancer screening -     Cytology - PAP  Other orders -     traZODone (DESYREL) 100 MG tablet; Take 1 tablet (100 mg total) by mouth at bedtime.     Well Adult Exam: Labs ordered: Yes. Patient counseling was done. See below for items discussed. Discussed the patient's BMI.  The BMI is not in the acceptable range; BMI management plan is completed Follow up in one year. Breast cancer screening: UTD. Cervical cancer screening: done today   Patient Counseling: [x]    Nutrition: Stressed importance of moderation in sodium/caffeine intake, saturated fat and cholesterol, caloric balance, sufficient intake of fresh fruits, vegetables, fiber, calcium, iron, and 1 mg of  folate supplement per day (for females capable of pregnancy).  [x]    Stressed the importance of regular exercise.   [x]    Substance Abuse: Discussed cessation/primary prevention of tobacco, alcohol, or other drug use; driving or other dangerous activities under the influence; availability of treatment for abuse.   [x]    Injury prevention: Discussed safety belts, safety helmets, smoke detector, smoking near bedding or upholstery.   [x]    Sexuality: Discussed sexually transmitted diseases, partner selection, use of condoms, avoidance of unintended pregnancy  and contraceptive alternatives.  [x]    Dental health: Discussed importance of regular tooth brushing, flossing, and dental visits.  [x]    Health maintenance and immunizations reviewed. Please refer to Health maintenance section.   CMA or LPN served as scribe during this visit. History, Physical, and Plan performed by medical provider. The above documentation has been reviewed and is accurate and complete.   [name], PA-C [place]

## 2020-07-21 NOTE — Patient Instructions (Signed)
It was great to see you!  You will be contacted about you referrals to colonoscopy and sports medicine.  Please go to the lab for blood work.   Our office will call you with your results unless you have chosen to receive results via MyChart.  If your blood work is normal we will follow-up each year for physicals and as scheduled for chronic medical problems.  If anything is abnormal we will treat accordingly and get you in for a follow-up.  Take care,  [facility] Maintenance, Female Adopting a healthy lifestyle and getting preventive care are important in promoting health and wellness. Ask your health care provider about:  The right schedule for you to have regular tests and exams.  Things you can do on your own to prevent diseases and keep yourself healthy. What should I know about diet, weight, and exercise? Eat a healthy diet   Eat a diet that includes plenty of vegetables, fruits, low-fat dairy products, and lean protein.  Do not eat a lot of foods that are high in solid fats, added sugars, or sodium. Maintain a healthy weight Body mass index (BMI) is used to identify weight problems. It estimates body fat based on height and weight. Your health care provider can help determine your BMI and help you achieve or maintain a healthy weight. Get regular exercise Get regular exercise. This is one of the most important things you can do for your health. Most adults should:  Exercise for at least 150 minutes each week. The exercise should increase your heart rate and make you sweat (moderate-intensity exercise).  Do strengthening exercises at least twice a week. This is in addition to the moderate-intensity exercise.  Spend less time sitting. Even light physical activity can be beneficial. Watch cholesterol and blood lipids Have your blood tested for lipids and cholesterol at 45 years of age, then have this test every 5 years. Have your cholesterol levels checked more  often if:  Your lipid or cholesterol levels are high.  You are older than 45 years of age.  You are at high risk for heart disease. What should I know about cancer screening? Depending on your health history and family history, you may need to have cancer screening at various ages. This may include screening for:  Breast cancer.  Cervical cancer.  Colorectal cancer.  Skin cancer.  Lung cancer. What should I know about heart disease, diabetes, and high blood pressure? Blood pressure and heart disease  High blood pressure causes heart disease and increases the risk of stroke. This is more likely to develop in people who have high blood pressure readings, are of African descent, or are overweight.  Have your blood pressure checked: ? Every 3-5 years if you are 12-76 years of age. ? Every year if you are 93 years old or older. Diabetes Have regular diabetes screenings. This checks your fasting blood sugar level. Have the screening done:  Once every three years after age 27 if you are at a normal weight and have a low risk for diabetes.  More often and at a younger age if you are overweight or have a high risk for diabetes. What should I know about preventing infection? Hepatitis B If you have a higher risk for hepatitis B, you should be screened for this virus. Talk with your health care provider to find out if you are at risk for hepatitis B infection. Hepatitis C Testing is recommended for:  Everyone born from [birth year]  through 1965.  Anyone with known risk factors for hepatitis C. Sexually transmitted infections (STIs)  Get screened for STIs, including gonorrhea and chlamydia, if: ? You are sexually active and are younger than 45 years of age. ? You are older than 45 years of age and your health care provider tells you that you are at risk for this type of infection. ? Your sexual activity has changed since you were last screened, and you are at increased risk for chlamydia  or gonorrhea. Ask your health care provider if you are at risk.  Ask your health care provider about whether you are at high risk for HIV. Your health care provider may recommend a prescription medicine to help prevent HIV infection. If you choose to take medicine to prevent HIV, you should first get tested for HIV. You should then be tested every 3 months for as long as you are taking the medicine. Pregnancy  If you are about to stop having your period (premenopausal) and you may become pregnant, seek counseling before you get pregnant.  Take 400 to 800 micrograms (mcg) of folic acid every day if you become pregnant.  Ask for birth control (contraception) if you want to prevent pregnancy. Osteoporosis and menopause Osteoporosis is a disease in which the bones lose minerals and strength with aging. This can result in bone fractures. If you are 54 years old or older, or if you are at risk for osteoporosis and fractures, ask your health care provider if you should:  Be screened for bone loss.  Take a calcium or vitamin D supplement to lower your risk of fractures.  Be given hormone replacement therapy (HRT) to treat symptoms of menopause. Follow these instructions at home: Lifestyle  Do not use any products that contain nicotine or tobacco, such as cigarettes, e-cigarettes, and chewing tobacco. If you need help quitting, ask your health care provider.  Do not use street drugs.  Do not share needles.  Ask your health care provider for help if you need support or information about quitting drugs. Alcohol use  Do not drink alcohol if: ? Your health care provider tells you not to drink. ? You are pregnant, may be pregnant, or are planning to become pregnant.  If you drink alcohol: ? Limit how much you use to 0-1 drink a day. ? Limit intake if you are breastfeeding.  Be aware of how much alcohol is in your drink. In the [place] one drink equals one 12 oz bottle of beer (355 mL), one 5 oz  glass of wine (148 mL), or one 1 oz glass of hard liquor (44 mL). General instructions  Schedule regular health, dental, and eye exams.  Stay current with your vaccines.  Tell your health care provider if: ? You often feel depressed. ? You have ever been abused or do not feel safe at home. Summary  Adopting a healthy lifestyle and getting preventive care are important in promoting health and wellness.  Follow your health care provider's instructions about healthy diet, exercising, and getting tested or screened for diseases.  Follow your health care provider's instructions on monitoring your cholesterol and blood pressure. This information is not intended to replace advice given to you by your health care provider. Make sure you discuss any questions you have with your health care provider. Document Revised: 08/28/2018 Document Reviewed: 08/28/2018 Elsevier Patient Education  2020 [employer].

## 2020-07-22 LAB — COMPREHENSIVE METABOLIC PANEL
AG Ratio: 1.9 (calc) (ref 1.0–2.5)
ALT: 36 U/L — ABNORMAL HIGH (ref 6–29)
AST: 25 U/L (ref 10–35)
Albumin: 4.7 g/dL (ref 3.6–5.1)
Alkaline phosphatase (APISO): 83 U/L (ref 31–125)
BUN: 11 mg/dL (ref 7–25)
CO2: 24 mmol/L (ref 20–32)
Calcium: 9.8 mg/dL (ref 8.6–10.2)
Chloride: 104 mmol/L (ref 98–110)
Creat: 0.92 mg/dL (ref 0.50–1.10)
Globulin: 2.5 g/dL (calc) (ref 1.9–3.7)
Glucose, Bld: 101 mg/dL — ABNORMAL HIGH (ref 65–99)
Potassium: 4.2 mmol/L (ref 3.5–5.3)
Sodium: 139 mmol/L (ref 135–146)
Total Bilirubin: 0.6 mg/dL (ref 0.2–1.2)
Total Protein: 7.2 g/dL (ref 6.1–8.1)

## 2020-07-22 LAB — CBC WITH DIFFERENTIAL/PLATELET
Absolute Monocytes: 280 cells/uL (ref 200–950)
Basophils Absolute: 40 cells/uL (ref 0–200)
Basophils Relative: 0.5 %
Eosinophils Absolute: 32 cells/uL (ref 15–500)
Eosinophils Relative: 0.4 %
HCT: 39.9 % (ref 35.0–45.0)
Hemoglobin: 13.7 g/dL (ref 11.7–15.5)
Lymphs Abs: 2440 cells/uL (ref 850–3900)
MCH: 32 pg (ref 27.0–33.0)
MCHC: 34.3 g/dL (ref 32.0–36.0)
MCV: 93.2 fL (ref 80.0–100.0)
MPV: 11 fL (ref 7.5–12.5)
Monocytes Relative: 3.5 %
Neutro Abs: 5208 cells/uL (ref 1500–7800)
Neutrophils Relative %: 65.1 %
Platelets: 352 10*3/uL (ref 140–400)
RBC: 4.28 10*6/uL (ref 3.80–5.10)
RDW: 12.3 % (ref 11.0–15.0)
Total Lymphocyte: 30.5 %
WBC: 8 10*3/uL (ref 3.8–10.8)

## 2020-07-22 LAB — LIPID PANEL
Cholesterol: 258 mg/dL — ABNORMAL HIGH (ref <200)
HDL: 50 mg/dL (ref >=50)
LDL Cholesterol (Calc): 177 mg/dL (calc) — ABNORMAL HIGH
Non-HDL Cholesterol (Calc): 208 mg/dL (calc) — ABNORMAL HIGH (ref <130)
Total CHOL/HDL Ratio: 5.2 (calc) — ABNORMAL HIGH (ref <5.0)
Triglycerides: 165 mg/dL — ABNORMAL HIGH (ref <150)

## 2020-07-22 LAB — HEPATITIS C ANTIBODY
Hepatitis C Ab: NONREACTIVE
SIGNAL TO CUT-OFF: 0.01 (ref <1.00)

## 2020-07-23 LAB — CYTOLOGY - PAP
Comment: NEGATIVE
Diagnosis: NEGATIVE
High risk HPV: NEGATIVE

## 2020-07-26 ENCOUNTER — Other Ambulatory Visit: Payer: Self-pay | Admitting: Physician Assistant

## 2020-07-26 ENCOUNTER — Encounter: Payer: Self-pay | Admitting: Physician Assistant

## 2020-07-26 MED ORDER — PRAVASTATIN SODIUM 20 MG PO TABS: 20.0000 mg | ORAL_TABLET | Freq: Every day | ORAL | 1 refills | Status: DC

## 2020-08-03 ENCOUNTER — Other Ambulatory Visit: Payer: Self-pay

## 2020-08-03 ENCOUNTER — Ambulatory Visit (INDEPENDENT_AMBULATORY_CARE_PROVIDER_SITE_OTHER): Payer: BC Managed Care – PPO

## 2020-08-03 ENCOUNTER — Ambulatory Visit (INDEPENDENT_AMBULATORY_CARE_PROVIDER_SITE_OTHER): Payer: BC Managed Care – PPO | Admitting: Family Medicine

## 2020-08-03 ENCOUNTER — Encounter: Payer: Self-pay | Admitting: Family Medicine

## 2020-08-03 VITALS — BP 110/78 | HR 68 | Ht 68.0 in | Wt 210.0 lb

## 2020-08-03 DIAGNOSIS — G8929 Other chronic pain: Secondary | ICD-10-CM | POA: Diagnosis not present

## 2020-08-03 DIAGNOSIS — M545 Low back pain, unspecified: Secondary | ICD-10-CM

## 2020-08-03 DIAGNOSIS — M999 Biomechanical lesion, unspecified: Secondary | ICD-10-CM

## 2020-08-03 NOTE — Progress Notes (Signed)
[place] Phone: [phone] Subjective:   [name], am serving as a scribe for Dr. Hulan Saas. This visit occurred during the SARS-CoV-2 public health emergency.  Safety protocols were in place, including screening questions prior to the visit, additional usage of staff PPE, and extensive cleaning of exam room while observing appropriate contact time as indicated for disinfecting solutions.  I'm seeing this patient by the request  of:  [name], [place]  CC: low back pain   [id number]  Susan [name] is a 44 y.o. female coming in with complaint of low back pain. Patient states that she had a potted plant that turned over and she tried to lift it up and developed back pain. States that another time recently she was ironing and turned wrong which caused a muscle spasm. Pain can be on left or right side. Denies any radiating symptoms. Used heat, IBU.       Past Medical History:  Diagnosis Date  . Abnormal pap 02/2003   CIN 2-3  . GERD (gastroesophageal reflux disease)   . Migraine with aura    Past Surgical History:  Procedure Laterality Date  . CERVICAL BIOPSY  W/ LOOP ELECTRODE EXCISION  02/2003   CIN 2-3  . CHOLECYSTECTOMY  2015  . COSMETIC SURGERY  2009   Social History   Socioeconomic History  . Marital status: Married    Spouse name: Not on file  . Number of children: 0  . Years of education: Not on file  . Highest education level: Not on file  Occupational History    Employer: [employer]  Tobacco Use  . Smoking status: Never Smoker  . Smokeless tobacco: Never Used  Substance and Sexual Activity  . Alcohol use: Yes    Alcohol/week: 1.0 - 2.0 standard drink    Types: 1 - 2 Glasses of wine per week    Comment: 1-2 glasses/week  . Drug use: No  . Sexual activity: Yes    Partners: Male    Birth control/protection: Condom  Other Topics Concern  . 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 Difficulty of Paying Living Expenses: Not on file  Food Insecurity:   . Worried About Charity fundraiser in the Last Year: Not on file  . Ran Out of Food in the Last Year: Not on file  Transportation Needs:   . Lack of Transportation (Medical): Not on file  . Lack of Transportation (Non-Medical): Not on file  Physical Activity:   . Days of Exercise per Week: Not on file  . Minutes of Exercise per Session: Not on file  Stress:   . Feeling of Stress : Not on file  Social Connections:   . Frequency of Communication with Friends and Family: Not on file  . Frequency of Social Gatherings with Friends and Family: Not on file  . Attends Religious Services: Not on file  . Active Member of Clubs or Organizations: Not on file  . Attends Archivist Meetings: Not on file  . Marital Status: Not on file   No Known Allergies Family History  Problem Relation Age of Onset  . Hypertension Father   . Depression Father        Suicide  . Hypertension Maternal Grandmother   . Diabetes Maternal Grandmother   .  Stroke Maternal Grandmother   . Heart failure Maternal Grandfather   . Diabetes Paternal Grandmother   . HIV Mother   . Other Mother   . Early death Mother        Age 36  . Depression Paternal Grandfather        Suicide  . Early death Brother        Age 52     Current Outpatient Medications (Cardiovascular):  .  pravastatin (PRAVACHOL) 20 MG tablet, Take 1 tablet (20 mg total) by mouth daily.     Current Outpatient Medications (Other):  .  cyclobenzaprine (FLEXERIL) 10 MG tablet, Take 1 tablet (10 mg total) by mouth 2 (two) times daily as needed for muscle spasms. Marland Kitchen  desoximetasone (TOPICORT) 0.25 % cream, Use 1 X daily prn. But not long term .  Lidocaine-Glycerin (PREPARATION H EX), Apply 1 application topically as  needed. .  Multiple Vitamins-Minerals (MULTIVITAMIN PO), Take by mouth daily. .  traZODone (DESYREL) 100 MG tablet, Take 1 tablet (100 mg total) by mouth at bedtime. .  triamcinolone ointment (KENALOG) 0.5 %, Apply to affected area 1-2 times daily, not long term   Reviewed prior external information including notes and imaging from  primary care provider As well as notes that were available from care everywhere and other healthcare systems.  Past medical history, social, surgical and family history all reviewed in electronic medical record.  No pertanent information unless stated regarding to the chief complaint.   Review of Systems:  No headache, visual changes, nausea, vomiting, diarrhea, constipation, dizziness, abdominal pain, skin rash, fevers, chills, night sweats, weight loss, swollen lymph nodes, body aches, joint swelling, chest pain, shortness of breath, mood changes. POSITIVE muscle aches  Objective  There were no vitals taken for this visit.   General: No apparent distress alert and oriented x3 mood and affect normal, dressed appropriately.  HEENT: Pupils equal, extraocular movements intact  Respiratory: Patient's speak in full sentences and does not appear short of breath  Cardiovascular: No lower extremity edema, non tender, no erythema  Neuro: Cranial nerves II through XII are intact, neurovascularly intact in all extremities with 2+ DTRs and 2+ pulses.  Gait normal with good balance and coordination.  MSK:  Non tender with full range of motion and good stability and symmetric strength and tone of shoulders, elbows, wrist, hip, knee and ankles bilaterally.  Back Exam:  Inspection: Mild loss of lordosis  Flexion n 45 deg, Extension 25 deg, Side Bending to 45 deg bilaterally,  Rotation to 45 deg bilaterally  SLR laying: Negative  XSLR laying: Negative  Palpable tenderness:  mild tender over the right sacroiliac joint.. R: Positive. Sensory change: Gross sensation intact  to all lumbar and sacral dermatomes.    Osteopathic findings  T6 extended rotated and side bent left L4 flexed rotated and side bent left  Sacrum right on right    Impression and Recommendations:     The above documentation has been reviewed and is accurate and complete Lyndal Pulley, DO

## 2020-08-03 NOTE — Assessment & Plan Note (Signed)
Low back pain overall multifactorial  Flexeril patient has if needed.  X-rays pending.  Responded well to manipulation follow-up again in 4 to 6 weeks

## 2020-08-03 NOTE — Assessment & Plan Note (Signed)

## 2020-08-03 NOTE — Patient Instructions (Signed)
Xray today Good to see you.  Ice 20 minutes 2 times daily. Usually after activity and before bed. Exercises 3 times a week.  Turmeric 500mg  daily  Tart cherry extract 1200mg  at night Vitamin D 2000 IU daily  Tried manipulation but I do not think you will need it long term See me again in 6-8 weeks

## 2020-08-06 ENCOUNTER — Encounter: Payer: Self-pay | Admitting: Family Medicine

## 2020-09-06 ENCOUNTER — Encounter: Payer: Self-pay | Admitting: Gastroenterology

## 2020-09-15 ENCOUNTER — Ambulatory Visit (INDEPENDENT_AMBULATORY_CARE_PROVIDER_SITE_OTHER): Payer: BC Managed Care – PPO | Admitting: Family Medicine

## 2020-09-15 ENCOUNTER — Encounter: Payer: Self-pay | Admitting: Family Medicine

## 2020-09-15 ENCOUNTER — Other Ambulatory Visit: Payer: Self-pay

## 2020-09-15 VITALS — BP 110/74 | HR 66 | Ht 68.0 in | Wt 221.0 lb

## 2020-09-15 DIAGNOSIS — M545 Low back pain, unspecified: Secondary | ICD-10-CM | POA: Diagnosis not present

## 2020-09-15 DIAGNOSIS — G8929 Other chronic pain: Secondary | ICD-10-CM | POA: Diagnosis not present

## 2020-09-15 DIAGNOSIS — M999 Biomechanical lesion, unspecified: Secondary | ICD-10-CM | POA: Diagnosis not present

## 2020-09-15 NOTE — Patient Instructions (Signed)
Think you are doing amazing Dont change anything See me in 10-12 weeks

## 2020-09-15 NOTE — Progress Notes (Signed)
[name] [address] [place] [address] Phone: [phone] Subjective:   Susan [name], am serving as a scribe for Dr. Antoine Primas. This visit occurred during the SARS-CoV-2 public health emergency.  Safety protocols were in place, including screening questions prior to the visit, additional usage of staff PPE, and extensive cleaning of exam room while observing appropriate contact time as indicated for disinfecting solutions.   I'm seeing this patient by the request  of:  Jarold Motto, [name]  CC: low back pain follow up   [id number]  Susan [name] is a 44 y.o. female coming in with complaint of back and neck pain. OMT 08/03/2020. Patient states that she has been feeling good since last.  Very minimal discomfort overall.  Patient has been being more active.  Doing the exercises.  Standing desk has helped out a lot  Medications patient has been prescribed: None  Taking:  Xray lumbar showed mild DDD at L5-S1 otherwise normal         Reviewed prior external information including notes and imaging from previsou exam, outside providers and external EMR if available.   As well as notes that were available from care everywhere and other healthcare systems.  Past medical history, social, surgical and family history all reviewed in electronic medical record.  No pertanent information unless stated regarding to the chief complaint.   Past Medical History:  Diagnosis Date  . Abnormal pap 02/2003   CIN 2-3  . GERD (gastroesophageal reflux disease)   . Migraine with aura     No Known Allergies   Review of Systems:  No headache, visual changes, nausea, vomiting, diarrhea, constipation, dizziness, abdominal pain, skin rash, fevers, chills, night sweats, weight loss, swollen lymph nodes, body aches, joint swelling, chest pain, shortness of breath, mood changes. POSITIVE muscle aches but mild   Objective  Blood pressure 110/74,  pulse 66, height 5\' 8"  (1.727 m), weight 221 lb (100.2 kg), SpO2 99 %.   General: No apparent distress alert and oriented x3 mood and affect normal, dressed appropriately.  HEENT: Pupils equal, extraocular movements intact  Respiratory: Patient's speak in full sentences and does not appear short of breath  Cardiovascular: No lower extremity edema, non tender, no erythema Neuro: Cranial nerves II through XII are intact, neurovascularly intact in all extremities with 2+ DTRs and 2+ pulses.  Gait normal with good balance and coordination.  MSK:  Non tender with full range of motion and good stability and symmetric strength and tone of shoulders, elbows, wrist, hip, knee and ankles bilaterally.  Back -low back exam very mild discomfort full with minimal tenderness over the right paraspinal musculature.  Improvement in range of motion since last exam.  Negative straight leg test.  Negative FABER test.  Osteopathic findings  T8 extended rotated and side bent left L2 flexed rotated and side bent right Sacrum right on right       Assessment and Plan:  Low back pain Patient has made already significant improvement in the last 5 to 6 weeks.  Patient is doing well enough.  No change in medications.  Continue with the same dose, home exercises.  Follow-up with me again in 3 months    Nonallopathic problems  Decision today to treat with OMT was based on Physical Exam  After verbal consent patient was treated with HVLA, ME, FPR techniques in  thoracic, lumbar, and sacral  areas  Patient tolerated the procedure well with improvement in symptoms  Patient given exercises, stretches and lifestyle modifications  See medications in patient instructions if given  Patient will follow up in 4-8 weeks      The above documentation has been reviewed and is accurate and complete Judi Saa, DO       Note: This dictation was prepared with Dragon dictation along with smaller phrase  technology. Any transcriptional errors that result from this process are unintentional.

## 2020-09-15 NOTE — Assessment & Plan Note (Signed)
Patient has made already significant improvement in the last 5 to 6 weeks.  Patient is doing well enough.  No change in medications.  Continue with the same dose, home exercises.  Follow-up with me again in 3 months

## 2020-09-29 ENCOUNTER — Other Ambulatory Visit: Payer: Self-pay

## 2020-09-29 ENCOUNTER — Ambulatory Visit (AMBULATORY_SURGERY_CENTER): Payer: Self-pay

## 2020-09-29 VITALS — Ht 68.0 in | Wt 221.0 lb

## 2020-09-29 DIAGNOSIS — Z1211 Encounter for screening for malignant neoplasm of colon: Secondary | ICD-10-CM

## 2020-09-29 MED ORDER — NA SULFATE-K SULFATE-MG SULF 17.5-3.13-1.6 GM/177ML PO SOLN: 1.0000 | Freq: Once | ORAL | 0 refills | Status: AC

## 2020-09-29 NOTE — Progress Notes (Signed)
Patient is here in-person for PV. Patient denies any allergies to eggs or soy. Patient denies any problems with anesthesia/sedation. Patient denies any oxygen use at home. Patient denies taking any diet/weight loss medications or blood thinners. Patient is not being treated for MRSA or C-diff. Patient is aware of our care-partner policy and NOMVE-72 safety protocol. EMMI education assigned to the patient for the procedure, sent to [facility].   COVID-19 vaccines and booster completed on 09/13/20, per patient.   Prep Prescription coupon given to the patient.

## 2020-10-07 ENCOUNTER — Encounter: Payer: Self-pay | Admitting: Gastroenterology

## 2020-10-13 ENCOUNTER — Encounter: Payer: Self-pay | Admitting: Gastroenterology

## 2020-10-13 ENCOUNTER — Ambulatory Visit (AMBULATORY_SURGERY_CENTER): Payer: BC Managed Care – PPO | Admitting: Gastroenterology

## 2020-10-13 ENCOUNTER — Other Ambulatory Visit: Payer: Self-pay

## 2020-10-13 VITALS — BP 125/85 | HR 55 | Temp 98.6°F | Resp 13 | Ht 68.0 in | Wt 221.0 lb

## 2020-10-13 DIAGNOSIS — D125 Benign neoplasm of sigmoid colon: Secondary | ICD-10-CM | POA: Diagnosis not present

## 2020-10-13 DIAGNOSIS — D124 Benign neoplasm of descending colon: Secondary | ICD-10-CM

## 2020-10-13 DIAGNOSIS — Z1211 Encounter for screening for malignant neoplasm of colon: Secondary | ICD-10-CM | POA: Diagnosis not present

## 2020-10-13 MED ORDER — SODIUM CHLORIDE 0.9 % IV SOLN: 500.0000 mL | Freq: Once | INTRAVENOUS | Status: DC

## 2020-10-13 NOTE — Op Note (Signed)
[facility] Patient Name: Susan [name] Procedure Date: 10/13/2020 2:43 PM MRN: [id number] Endoscopist: Meryl Dare , MD Age: 45 Referring MD:  Date of Birth: Nov 15, 1975 Gender: Female Account #: [id number] Procedure:                Colonoscopy Indications:              Screening for colorectal malignant neoplasm Medicines:                Monitored Anesthesia Care Procedure:                Pre-Anesthesia Assessment:                           - Prior to the procedure, a History and Physical                            was performed, and patient medications and                            allergies were reviewed. The patient's tolerance of                            previous anesthesia was also reviewed. The risks                            and benefits of the procedure and the sedation                            options and risks were discussed with the patient.                            All questions were answered, and informed consent                            was obtained. Prior Anticoagulants: The patient has                            taken no previous anticoagulant or antiplatelet                            agents. ASA Grade Assessment: II - A patient with                            mild systemic disease. After reviewing the risks                            and benefits, the patient was deemed in                            satisfactory condition to undergo the procedure.                           After obtaining informed consent, the colonoscope  was passed under direct vision. Throughout the                            procedure, the patient's blood pressure, pulse, and                            oxygen saturations were monitored continuously. The                            Colonoscope was introduced through the anus and                            advanced to the the cecum, identified by                            appendiceal orifice  and ileocecal valve. The                            ileocecal valve, appendiceal orifice, and rectum                            were photographed. The quality of the bowel                            preparation was excellent. The colonoscopy was                            performed without difficulty. The patient tolerated                            the procedure well. Scope In: 2:46:54 PM Scope Out: 2:59:39 PM Scope Withdrawal Time: 0 hours 10 minutes 11 seconds  Total Procedure Duration: 0 hours 12 minutes 45 seconds  Findings:                 The perianal and digital rectal examinations were                            normal.                           Two sessile polyps were found in the sigmoid colon                            and descending colon. The polyps were 5 to 7 mm in                            size. These polyps were removed with a cold snare.                            Resection and retrieval were complete.                           The exam was otherwise without abnormality on  direct and retroflexion views. Complications:            No immediate complications. Estimated blood loss:                            None. Estimated Blood Loss:     Estimated blood loss: none. Impression:               - Two 5 to 7 mm polyps in the sigmoid colon and in                            the descending colon, removed with a cold snare.                            Resected and retrieved.                           - The examination was otherwise normal on direct                            and retroflexion views. Recommendation:           - Repeat colonoscopy after studies are complete for                            surveillance based on pathology results.                           - Patient has a contact number available for                            emergencies. The signs and symptoms of potential                            delayed complications were discussed  with the                            patient. Return to normal activities tomorrow.                            Written discharge instructions were provided to the                            patient.                           - Resume previous diet.                           - Continue present medications.                           - Await pathology results. Meryl Dare, MD 10/13/2020 3:02:26 PM This report has been signed electronically.

## 2020-10-13 NOTE — Progress Notes (Signed)
Pt's states no medical or surgical changes since previsit or office visit. 

## 2020-10-13 NOTE — Progress Notes (Signed)
Report to PACU, RN, vss, BBS= Clear.  

## 2020-10-13 NOTE — Progress Notes (Signed)
Called to room to assist during endoscopic procedure.  Patient ID and intended procedure confirmed with present staff. Received instructions for my participation in the procedure from the performing physician.  

## 2020-10-13 NOTE — Patient Instructions (Signed)
Read all of the handouts given to you by your recovery room nurse. ? ?YOU HAD AN ENDOSCOPIC PROCEDURE TODAY AT THE [facility]:   Refer to the procedure report that was given to you for any specific questions about what was found during the examination.  If the procedure report does not answer your questions, please call your gastroenterologist to clarify.  If you requested that your care partner not be given the details of your procedure findings, then the procedure report has been included in a sealed envelope for you to review at your convenience later. ? ?YOU SHOULD EXPECT: Some feelings of bloating in the abdomen. Passage of more gas than usual.  Walking can help get rid of the air that was put into your GI tract during the procedure and reduce the bloating. If you had a lower endoscopy (such as a colonoscopy or flexible sigmoidoscopy) you may notice spotting of blood in your stool or on the toilet paper. If you underwent a bowel prep for your procedure, you may not have a normal bowel movement for a few days. ? ?Please Note:  You might notice some irritation and congestion in your nose or some drainage.  This is from the oxygen used during your procedure.  There is no need for concern and it should clear up in a day or so. ? ?SYMPTOMS TO REPORT IMMEDIATELY: ? ?Following lower endoscopy (colonoscopy or flexible sigmoidoscopy): ? Excessive amounts of blood in the stool ? Significant tenderness or worsening of abdominal pains ? Swelling of the abdomen that is new, acute ? Fever of 100?F or higher ? ?  ?For urgent or emergent issues, a gastroenterologist can be reached at any hour by calling [phone]. ?Do not use MyChart messaging for urgent concerns.  ? ? ?DIET:  We do recommend a small meal at first, but then you may proceed to your regular diet.  Drink plenty of fluids but you should avoid alcoholic beverages for 24 hours. ? ?ACTIVITY:  You should plan to take it easy for the rest of today  and you should NOT DRIVE or use heavy machinery until tomorrow (because of the sedation medicines used during the test).   ? ?FOLLOW UP: ?Our staff will call the number listed on your records 48-72 hours following your procedure to check on you and address any questions or concerns that you may have regarding the information given to you following your procedure. If we do not reach you, we will leave a message.  We will attempt to reach you two times.  During this call, we will ask if you have developed any symptoms of COVID 19. If you develop any symptoms (ie: fever, flu-like symptoms, shortness of breath, cough etc.) before then, please call [phone].  If you test positive for Covid 19 in the 2 weeks post procedure, please call and report this information to us.   ? ?If any biopsies were taken you will be contacted by phone or by letter within the next 1-3 weeks.  Please call us at [phone] if you have not heard about the biopsies in 3 weeks.  ? ? ?SIGNATURES/CONFIDENTIALITY: ?You and/or your care partner have signed paperwork which will be entered into your electronic medical record.  These signatures attest to the fact that that the information above on your After Visit Summary has been reviewed and is understood.  Full responsibility of the confidentiality of this discharge information lies with you and/or your care-partner.  ?

## 2020-10-15 ENCOUNTER — Telehealth: Payer: Self-pay

## 2020-10-15 NOTE — Telephone Encounter (Signed)
Attempted to reach patient for post-procedure f/u call. No answer. Left message that we will make another attempt to reach her later today and for her to please not hesitate to call us if she has any questions/concerns regarding her care. 

## 2020-10-15 NOTE — Telephone Encounter (Signed)
  Follow up Call-  Call back number 10/13/2020  Post procedure Call Back phone  # [phone]  Permission to leave phone message Yes  Some recent data might be hidden     2nd follow up call made.  NALM

## 2020-10-26 ENCOUNTER — Encounter: Payer: Self-pay | Admitting: Gastroenterology

## 2020-11-19 DIAGNOSIS — L814 Other melanin hyperpigmentation: Secondary | ICD-10-CM | POA: Diagnosis not present

## 2020-11-19 DIAGNOSIS — L309 Dermatitis, unspecified: Secondary | ICD-10-CM | POA: Diagnosis not present

## 2020-11-19 DIAGNOSIS — L578 Other skin changes due to chronic exposure to nonionizing radiation: Secondary | ICD-10-CM | POA: Diagnosis not present

## 2020-11-19 DIAGNOSIS — Z23 Encounter for immunization: Secondary | ICD-10-CM | POA: Diagnosis not present

## 2020-12-01 ENCOUNTER — Encounter: Payer: Self-pay | Admitting: Physician Assistant

## 2020-12-07 ENCOUNTER — Encounter: Payer: Self-pay | Admitting: Family Medicine

## 2020-12-07 ENCOUNTER — Ambulatory Visit (INDEPENDENT_AMBULATORY_CARE_PROVIDER_SITE_OTHER): Payer: BC Managed Care – PPO | Admitting: Family Medicine

## 2020-12-07 ENCOUNTER — Other Ambulatory Visit: Payer: Self-pay

## 2020-12-07 VITALS — BP 122/90 | HR 75 | Ht 68.0 in | Wt 221.0 lb

## 2020-12-07 DIAGNOSIS — M545 Low back pain, unspecified: Secondary | ICD-10-CM | POA: Diagnosis not present

## 2020-12-07 DIAGNOSIS — G8929 Other chronic pain: Secondary | ICD-10-CM

## 2020-12-07 DIAGNOSIS — M999 Biomechanical lesion, unspecified: Secondary | ICD-10-CM | POA: Diagnosis not present

## 2020-12-07 NOTE — Patient Instructions (Signed)
Keep fingers crossed you make it to [place] [facility] See me in 3-4 months or if doing well keep moving it back

## 2020-12-07 NOTE — Progress Notes (Signed)
[place] Phone: [phone] Subjective:   Susan [name], am serving as a scribe for Dr. Hulan Saas. This visit occurred during the SARS-CoV-2 public health emergency.  Safety protocols were in place, including screening questions prior to the visit, additional usage of staff PPE, and extensive cleaning of exam room while observing appropriate contact time as indicated for disinfecting solutions.   I'm seeing this patient by the request  of:  [name], [place]  CC: low back pain   [id number]  Susan [name] is a 45 y.o. female coming in with complaint of back and neck pain. OMT 09/15/2020. Patient states low back pain.  Nothing significant.  Patient states that is feeling better overall.  Noted making progress.  Doing home exercises.  Taking vitamins but nothing else for pain at the moment.  Medications patient has been prescribed: None          Reviewed prior external information including notes and imaging from previsou exam, outside providers and external EMR if available.   As well as notes that were available from care everywhere and other healthcare systems.  Past medical history, social, surgical and family history all reviewed in electronic medical record.  [name]   Past Medical History:  Diagnosis Date  . Abnormal pap 02/2003   CIN 2-3  . GERD (gastroesophageal reflux disease)   . Hyperlipidemia   . Migraine with aura     [name]   Review of Systems:  [name], visual changes, nausea, vomiting, diarrhea, constipation, dizziness, abdominal pain, skin rash, fevers, chills, night sweats, weight loss, swollen lymph nodes, body aches, joint swelling, chest pain, shortness of breath, mood changes. POSITIVE muscle aches mild  Objective  Blood pressure 122/90, pulse 75, height 5\' 8"  (1.727 m), weight 221  lb (100.2 kg), SpO2 95 %.   General: [name], dressed appropriately.  Overweight HEENT: Pupils equal, extraocular movements intact  Respiratory: Patient's speak in full sentences and does not appear short of breath  Cardiovascular: [name] lower extremity edema, non tender, [name] erythema   Gait normal with good balance and coordination.  MSK:  Non tender with full range of motion and good stability and symmetric strength and tone of shoulders, elbows, wrist, hip, knee and ankles bilaterally.  Back -[name] back exam does have some mild loss of lordosis.  Some tenderness to palpation in the paraspinal musculature.  Patient does have very mild loss of lordosis.  Osteopathic findings  T6 extended rotated and side bent left L2 flexed rotated and side bent right Sacrum right on right       Assessment and Plan:  Low back pain Stable overall.  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  Patient has been doing very well with conservative therapy.  [name] significant large changes.  Follow-up with me again in 3 to 4 months    Nonallopathic problems  Decision today to treat with OMT was based on Physical Exam  After verbal consent patient was treated with HVLA, ME, FPR techniques in thoracic, lumbar, and sacral  areas  Patient tolerated the procedure well with improvement in symptoms  Patient given exercises, stretches and lifestyle modifications  See medications in patient instructions if given  Patient will follow up in 3 to 4 months      The above documentation has been reviewed  and is accurate and complete Lyndal Pulley, DO       Note: This dictation was prepared with Dragon dictation along with smaller phrase technology. Any transcriptional errors that result from this process are unintentional.

## 2020-12-07 NOTE — Assessment & Plan Note (Signed)
Stable overall.  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  Patient has been doing very well with conservative therapy.  No significant large changes.  Follow-up with me again in 3 to 4 months

## 2021-01-17 ENCOUNTER — Other Ambulatory Visit: Payer: Self-pay | Admitting: Physician Assistant

## 2021-02-22 ENCOUNTER — Ambulatory Visit (INDEPENDENT_AMBULATORY_CARE_PROVIDER_SITE_OTHER): Payer: BC Managed Care – PPO | Admitting: Family Medicine

## 2021-02-22 ENCOUNTER — Telehealth: Payer: Self-pay | Admitting: Family Medicine

## 2021-02-22 ENCOUNTER — Encounter: Payer: Self-pay | Admitting: Family Medicine

## 2021-02-22 ENCOUNTER — Other Ambulatory Visit: Payer: Self-pay

## 2021-02-22 VITALS — BP 110/84 | HR 71 | Ht 68.0 in | Wt 220.0 lb

## 2021-02-22 DIAGNOSIS — M545 Low back pain, unspecified: Secondary | ICD-10-CM | POA: Diagnosis not present

## 2021-02-22 DIAGNOSIS — M2550 Pain in unspecified joint: Secondary | ICD-10-CM | POA: Diagnosis not present

## 2021-02-22 DIAGNOSIS — G8929 Other chronic pain: Secondary | ICD-10-CM

## 2021-02-22 MED ORDER — METHYLPREDNISOLONE ACETATE 80 MG/ML IJ SUSP
80.0000 mg | Freq: Once | INTRAMUSCULAR | Status: AC
  Administered 2021-02-22: 80 mg via INTRAMUSCULAR

## 2021-02-22 MED ORDER — TIZANIDINE HCL 4 MG PO TABS: 4.0000 mg | ORAL_TABLET | Freq: Every day | ORAL | 2 refills | Status: DC

## 2021-02-22 MED ORDER — PREDNISONE 20 MG PO TABS: 40.0000 mg | ORAL_TABLET | Freq: Every day | ORAL | 0 refills | Status: DC

## 2021-02-22 MED ORDER — KETOROLAC TROMETHAMINE 60 MG/2ML IM SOLN
60.0000 mg | Freq: Once | INTRAMUSCULAR | Status: AC
  Administered 2021-02-22: 60 mg via INTRAMUSCULAR

## 2021-02-22 NOTE — Telephone Encounter (Signed)
Spoke with patient per recommendations.  

## 2021-02-22 NOTE — Assessment & Plan Note (Signed)
Severe back pain that seems to be more muscular to her.  Patient is having no radicular symptoms.  No weakness of the lower extremities.  Patient denies any bowel or bladder incontinence or any recent illnesses.  Patient has not had no dysuria and has not noticed any hematuria.  We will hold on any type of labs but discussed that if worsening pain to seek medical attention.  Patient will follow up with me again within 2 weeks otherwise.  Toradol and Depo-Medrol given today and given a short course of prednisone with muscle relaxer at night.  Warned of potential side effects and encouraged patient to read pamphlets and avoid anti-inflammatories patient will follow up again in 2 weeks

## 2021-02-22 NOTE — Telephone Encounter (Signed)
Patient called stating that she "threw her back out again" and is having extreme back pain. She is scheduled for tomorrow. Patient asked if Dr Tamala Julian would be able to send in a prescription for a muscle relaxer or pain medication.

## 2021-02-22 NOTE — Telephone Encounter (Signed)
Sent in muscle relaxer to take at night. Do not see any contraindications in chart but have patient read the pamphlet given to her.   Have her do ibprofen 3 pills 3 times a day until I see her tomorrow Worsening pain need to go to ER today

## 2021-02-22 NOTE — Progress Notes (Signed)
[place] Phone: [phone] Subjective:   Susan [name], am serving as a scribe for Dr. Hulan Saas. This visit occurred during the SARS-CoV-2 public health emergency.  Safety protocols were in place, including screening questions prior to the visit, additional usage of staff PPE, and extensive cleaning of exam room while observing appropriate contact time as indicated for disinfecting solutions.  I'm seeing this patient by the request  of:  [name], [place]  CC: Exacerbation of low back pain.  [id number]  Susan [name] is a 45 y.o. female coming in with complaint of low back pain.  Last seen in March at 2022. Patient noticed soreness on Sunday. Went to work yesterday and noted that by the end of the day her pain had increased from Sunday. Pain in lower back in middle of back. Cannot think of anything that would have caused.  Patient denies any radiation down the legs, denies any weakness.  Having difficulty at all with any type of movement.  Denies any bowel or bladder incontinence, denies any allergies recently.  Denies any recent illnesses     X-rays of the lumbar spine have shown the patient does have mild degenerative disc disease at L5-S1.  Past Medical History:  Diagnosis Date  . Abnormal pap 02/2003   CIN 2-3  . GERD (gastroesophageal reflux disease)   . Hyperlipidemia   . Migraine with aura    Past Surgical History:  Procedure Laterality Date  . CERVICAL BIOPSY  W/ LOOP ELECTRODE EXCISION  02/2003   CIN 2-3  . CHOLECYSTECTOMY  2015  . COSMETIC SURGERY  2009   Social History   Socioeconomic History  . Marital status: Married    Spouse name: Not on file  . Number of children: 0  . Years of education: Not on file  . Highest education level: Not on file  Occupational History    Employer: [employer]  Tobacco Use  . Smoking status: Never Smoker  . Smokeless tobacco: Never Used   Vaping Use  . Vaping Use: Never used  Substance and Sexual Activity  . Alcohol use: Yes    Alcohol/week: 1.0 - 2.0 standard drink    Types: 1 - 2 Glasses of wine per week    Comment: 1-2 glasses/week  . Drug use: [name]  . Sexual activity: Yes    Partners: Male    Birth control/protection: Condom  Other Topics Concern  . 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 Hypertension Father   . Depression Father        Suicide  . Hypertension Maternal Grandmother   . Diabetes Maternal Grandmother   . Stroke Maternal Grandmother   . Heart failure Maternal Grandfather   . Diabetes Paternal Grandmother   . HIV Mother   . Other Mother   . Early death Mother        Age 28  . Depression Paternal Grandfather        Suicide  . Early death Brother        Age 26  .  Colon cancer Neg Hx   . Colon polyps Neg Hx   . Esophageal cancer Neg Hx   . Rectal cancer Neg Hx   . Stomach cancer Neg Hx     Current Outpatient Medications (Endocrine & Metabolic):  .  predniSONE (DELTASONE) 20 MG tablet, Take 2 tablets (40 mg total) by mouth daily with breakfast.  Current Outpatient Medications (Cardiovascular):  .  pravastatin (PRAVACHOL) 20 MG tablet, TAKE 1 TABLET BY MOUTH EVERY DAY     Current Outpatient Medications (Other):  .  desoximetasone (TOPICORT) 0.25 % cream, Use 1 X daily prn. But not long term .  Multiple Vitamins-Minerals (MULTIVITAMIN PO), Take by mouth daily. Marland Kitchen  tiZANidine (ZANAFLEX) 4 MG tablet, Take 1 tablet (4 mg total) by mouth at bedtime. .  traZODone (DESYREL) 100 MG tablet, TAKE 1 TABLET BY MOUTH EVERYDAY AT  BEDTIME   Reviewed prior external information including notes and imaging from  primary care provider As well as notes that were available from care everywhere and other healthcare systems.  Past medical history, social, surgical and family history all reviewed in electronic medical record.  [name]   Review of Systems:  [name], visual changes, nausea, vomiting, diarrhea, constipation, dizziness, abdominal pain, skin rash, fevers, chills, night sweats, weight loss, swollen lymph nodes,  joint swelling, chest pain, shortness of breath, mood changes. POSITIVE muscle aches, body aches  Objective  Blood pressure 110/84, pulse 71, height 5\' 8"  (1.727 m), weight 220 lb (99.8 kg), SpO2 99 %.   General: [name], dressed appropriately.  HEENT: Pupils equal, extraocular movements intact  Respiratory: Patient's speak in full sentences and does not appear short of breath  Cardiovascular: [name] lower extremity edema, non tender, [name] erythema  Gait normal with good balance and coordination.  MSK: Low back exam does have significant loss of lordosis.  Some tenderness to palpation diffusely of the lumbar spine.  [name] midline tenderness.  Tightness noted with straight leg test and FABER test.  Patient will has [name] true radicular symptoms.  Neurovascularly intact distally.  Deep tendon reflexes are intact.      Impression and Recommendations:     The above documentation has been reviewed and is accurate and complete Lyndal Pulley, DO

## 2021-02-22 NOTE — Patient Instructions (Addendum)
Injections in backside today  Prednisone 40mg  starting tmrw for next 5 days Do not use NSAIDS such as Advil or Aleve when taking Prednisone It is ok to use Tylenol for additional pain relief  Zanaflex next 3 nights then as needed  See me again on June 20th, 2022

## 2021-02-23 ENCOUNTER — Ambulatory Visit: Payer: BC Managed Care – PPO | Admitting: Family Medicine

## 2021-03-03 NOTE — Progress Notes (Signed)
[place] Phone: [phone] Subjective:   [name], am serving as a scribe for Dr. Hulan Saas.  This visit occurred during the SARS-CoV-2 public health emergency.  Safety protocols were in place, including screening questions prior to the visit, additional usage of staff PPE, and extensive cleaning of exam room while observing appropriate contact time as indicated for disinfecting solutions.    I'm seeing this patient by the request  of:  [name], [place]  CC: back pain   [id number]  02/22/2021 Severe back pain that seems to be more muscular to her.  Patient is having no radicular symptoms.  No weakness of the lower extremities.  Patient denies any bowel or bladder incontinence or any recent illnesses.  Patient has not had no dysuria and has not noticed any hematuria.  We will hold on any type of labs but discussed that if worsening pain to seek medical attention.  Patient will follow up with me again within 2 weeks otherwise.  Toradol and Depo-Medrol given today and given a short course of prednisone with muscle relaxer at night.  Warned of potential side effects and encouraged patient to read pamphlets and avoid anti-inflammatories patient will follow up again in 2 weeks  Update 03/07/2021 KYLIEE ORTEGO is a 45 y.o. female coming in with complaint of back pain. Patient states that she has stiffness in the mornings. Overall feels like she is doing better. Pain increases with sitting for prolonged periods. Has not had to use mm relaxer in over 1 week.      Past Medical History:  Diagnosis Date   Abnormal pap 02/2003   CIN 2-3   GERD (gastroesophageal reflux disease)    Hyperlipidemia    Migraine with aura    Past Surgical History:  Procedure Laterality Date   CERVICAL BIOPSY  W/ LOOP ELECTRODE EXCISION  02/2003   CIN 2-3   CHOLECYSTECTOMY  2015   COSMETIC SURGERY  2009   Social History    Socioeconomic History   Marital status: Married    Spouse name: Not on file   Number of children: 0   Years of education: Not on file   Highest education level: Not on file  Occupational History    Employer: [employer]  Tobacco Use   Smoking status: Never   Smokeless tobacco: Never  Vaping Use   Vaping Use: Never used  Substance and Sexual Activity   Alcohol use: Yes    Alcohol/week: 1.0 - 2.0 standard drink    Types: 1 - 2 Glasses of wine per week    Comment: 1-2 glasses/week   Drug use: No   Sexual activity: Yes    Partners: Male    Birth control/protection: Condom  Other Topics Concern   Not on file  Social History Narrative   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    Social Determinants of Health   Financial Resource Strain: Not on file  Food Insecurity: Not on file  Transportation Needs: Not on file  Physical Activity: Not on file  Stress: Not on file  Social Connections: Not on file   No Known Allergies Family History  Problem Relation Age of Onset   Hypertension Father    Depression Father        Suicide   Hypertension Maternal Grandmother    Diabetes Maternal Grandmother    Stroke Maternal Grandmother    Heart failure Maternal Grandfather  Diabetes Paternal 70    HIV Mother    Other Mother    Early death Mother        Age 32   Depression Paternal Grandfather        Suicide   Early death Brother        Age 71   Colon cancer Neg Hx    Colon polyps Neg Hx    Esophageal cancer Neg Hx    Rectal cancer Neg Hx    Stomach cancer Neg Hx      Current Outpatient Medications (Cardiovascular):    pravastatin (PRAVACHOL) 20 MG tablet, TAKE 1 TABLET BY MOUTH EVERY DAY     Current Outpatient Medications (Other):    desoximetasone (TOPICORT) 0.25 % cream, Use 1 X daily prn. But not long term   Multiple Vitamins-Minerals (MULTIVITAMIN PO), Take by mouth daily.   tiZANidine (ZANAFLEX) 4 MG tablet, Take 1 tablet (4 mg total)  by mouth at bedtime.   traZODone (DESYREL) 100 MG tablet, TAKE 1 TABLET BY MOUTH EVERYDAY AT BEDTIME   Reviewed prior external information including notes and imaging from  primary care provider As well as notes that were available from care everywhere and other healthcare systems.  Past medical history, social, surgical and family history all reviewed in electronic medical record.  No pertanent information unless stated regarding to the chief complaint.   Review of Systems:  No headache, visual changes, nausea, vomiting, diarrhea, constipation, dizziness, abdominal pain, skin rash, fevers, chills, night sweats, weight loss, swollen lymph nodes, body aches, joint swelling, chest pain, shortness of breath, mood changes. POSITIVE muscle aches but improving  Objective  Blood pressure 110/76, pulse 67, height 5\' 8"  (1.727 m), weight 220 lb (99.8 kg), SpO2 99 %.   General: No apparent distress alert and oriented x3 mood and affect normal, dressed appropriately.  HEENT: Pupils equal, extraocular movements intact  Respiratory: Patient's speak in full sentences and does not appear short of breath  Cardiovascular: No lower extremity edema, non tender, no erythema  Gait normal with good balance and coordination.  MSK: Low back exam does have significant loss of lordosis.  Patient still has some tenderness noted to the left paraspinal musculature of the lumbar spine and the right sacroiliac joint.  Mild positive FABER test.  Negative straight leg test.  Still improvement noted from previous exam.  Osteopathic findings T6 extended rotated and side bent right L3 flexed rotated and side bent left Sacrum right on right   Impression and Recommendations:     The above documentation has been reviewed and is accurate and complete Lyndal Pulley, DO

## 2021-03-04 DIAGNOSIS — L309 Dermatitis, unspecified: Secondary | ICD-10-CM | POA: Diagnosis not present

## 2021-03-07 ENCOUNTER — Ambulatory Visit (INDEPENDENT_AMBULATORY_CARE_PROVIDER_SITE_OTHER): Payer: BC Managed Care – PPO | Admitting: Family Medicine

## 2021-03-07 ENCOUNTER — Other Ambulatory Visit: Payer: Self-pay

## 2021-03-07 ENCOUNTER — Encounter: Payer: Self-pay | Admitting: Family Medicine

## 2021-03-07 VITALS — BP 110/76 | HR 67 | Ht 68.0 in | Wt 220.0 lb

## 2021-03-07 DIAGNOSIS — M9903 Segmental and somatic dysfunction of lumbar region: Secondary | ICD-10-CM | POA: Diagnosis not present

## 2021-03-07 DIAGNOSIS — M545 Low back pain, unspecified: Secondary | ICD-10-CM | POA: Diagnosis not present

## 2021-03-07 DIAGNOSIS — M999 Biomechanical lesion, unspecified: Secondary | ICD-10-CM

## 2021-03-07 DIAGNOSIS — G8929 Other chronic pain: Secondary | ICD-10-CM

## 2021-03-07 DIAGNOSIS — M9904 Segmental and somatic dysfunction of sacral region: Secondary | ICD-10-CM | POA: Diagnosis not present

## 2021-03-07 DIAGNOSIS — M9902 Segmental and somatic dysfunction of thoracic region: Secondary | ICD-10-CM | POA: Diagnosis not present

## 2021-03-07 NOTE — Patient Instructions (Signed)
Start manipulation again Restart exercises See me in 6-8 weeks

## 2021-03-07 NOTE — Assessment & Plan Note (Signed)
   Decision today to treat with OMT was based on Physical Exam  After verbal consent patient was treated with HVLA, ME, FPR techniques in  lumbar and sacral areas, all areas are chronic   Patient tolerated the procedure well with improvement in symptoms  Patient given exercises, stretches and lifestyle modifications  See medications in patient instructions if given  Patient will follow up in 4-8 weeks 

## 2021-03-07 NOTE — Assessment & Plan Note (Signed)
Significant improvement from previous exam.  Started on osteopathic manipulation.  Does have the Zanaflex for nighttime pain.  Discussed icing regimen.  Increase activity slowly.  Follow-up with me again in 6 to 8 weeks

## 2021-03-15 ENCOUNTER — Ambulatory Visit: Payer: BC Managed Care – PPO | Admitting: Family Medicine

## 2021-05-17 ENCOUNTER — Other Ambulatory Visit: Payer: Self-pay

## 2021-05-17 ENCOUNTER — Ambulatory Visit (INDEPENDENT_AMBULATORY_CARE_PROVIDER_SITE_OTHER): Payer: BC Managed Care – PPO | Admitting: Family Medicine

## 2021-05-17 ENCOUNTER — Encounter: Payer: Self-pay | Admitting: Family Medicine

## 2021-05-17 VITALS — BP 110/80 | HR 70 | Ht 68.0 in | Wt 218.0 lb

## 2021-05-17 DIAGNOSIS — M9903 Segmental and somatic dysfunction of lumbar region: Secondary | ICD-10-CM

## 2021-05-17 DIAGNOSIS — M545 Low back pain, unspecified: Secondary | ICD-10-CM

## 2021-05-17 DIAGNOSIS — M9902 Segmental and somatic dysfunction of thoracic region: Secondary | ICD-10-CM

## 2021-05-17 DIAGNOSIS — M9904 Segmental and somatic dysfunction of sacral region: Secondary | ICD-10-CM | POA: Diagnosis not present

## 2021-05-17 DIAGNOSIS — G8929 Other chronic pain: Secondary | ICD-10-CM

## 2021-05-17 MED ORDER — PREDNISONE 20 MG PO TABS: 20.0000 mg | ORAL_TABLET | Freq: Every day | ORAL | 0 refills | Status: DC

## 2021-05-17 NOTE — Progress Notes (Signed)
[place] Phone: [phone] Subjective:   [name], am serving as a scribe for Dr. Hulan Saas.  This visit occurred during the SARS-CoV-2 public health emergency.  Safety protocols were in place, including screening questions prior to the visit, additional usage of staff PPE, and extensive cleaning of exam room while observing appropriate contact time as indicated for disinfecting solutions.   I'm seeing this patient by the request  of:  [name], [place]  CC:   [id number]  ADILYNN SALB is a 45 y.o. female coming in with complaint of back and neck pain. OMT 03/07/2021. Patient states that she has been doing well. No issues since last visit.   Medications patient has been prescribed:   Taking:         Reviewed prior external information including notes and imaging from previsou exam, outside providers and external EMR if available.   As well as notes that were available from care everywhere and other healthcare systems.  Past medical history, social, surgical and family history all reviewed in electronic medical record.  No pertanent information unless stated regarding to the chief complaint.   Past Medical History:  Diagnosis Date   Abnormal pap 02/2003   CIN 2-3   GERD (gastroesophageal reflux disease)    Hyperlipidemia    Migraine with aura     No Known Allergies   Review of Systems:  No headache, visual changes, nausea, vomiting, diarrhea, constipation, dizziness, abdominal pain, skin rash, fevers, chills, night sweats, weight loss, swollen lymph nodes, body aches, joint swelling, chest pain, shortness of breath, mood changes. POSITIVE muscle aches  Objective  There were no vitals taken for this visit.   General: No apparent distress alert and oriented x3 mood and affect normal, dressed appropriately.  HEENT: Pupils equal, extraocular movements intact  Respiratory:  Patient's speak in full sentences and does not appear short of breath  Cardiovascular: No lower extremity edema, non tender, no erythema  Neuro: Cranial nerves II through XII are intact, neurovascularly intact in all extremities with 2+ DTRs and 2+ pulses.  Gait normal with good balance and coordination.  MSK:  Non tender with full range of motion and good stability and symmetric strength and tone of shoulders, elbows, wrist, hip, knee and ankles bilaterally.  Back - Normal skin, Spine with normal alignment and no deformity.  No tenderness to vertebral process palpation.  Paraspinous muscles are not tender and without spasm.   Range of motion is full at neck and lumbar sacral regions  Osteopathic findings  C2 flexed rotated and side bent right C6 flexed rotated and side bent left T3 extended rotated and side bent right inhaled rib T9 extended rotated and side bent left L2 flexed rotated and side bent right Sacrum right on right       Assessment and Plan:    Nonallopathic problems  Decision today to treat with OMT was based on Physical Exam  After verbal consent patient was treated with HVLA, ME, FPR techniques in cervical, rib, thoracic, lumbar, and sacral  areas  Patient tolerated the procedure well with improvement in symptoms  Patient given exercises, stretches and lifestyle modifications  See medications in patient instructions if given  Patient will follow up in 4-8 weeks      The above documentation has been reviewed and is accurate and complete Jacqualin Combes       Note: This dictation was prepared with Sales executive  along with smaller phrase technology. Any transcriptional errors that result from this process are unintentional.

## 2021-05-17 NOTE — Progress Notes (Deleted)
[facility] [address] [place] Phone: [phone] Subjective:    I'm seeing this patient by the request  of:  [name], [place]  CC:   [id number]  Susan [name] is a 45 y.o. female coming in with complaint of ***  Onset-  Location Duration-  Character- Aggravating factors- Reliving factors-  Therapies tried-  Severity-     Past Medical History:  Diagnosis Date   Abnormal pap 02/2003   CIN 2-3   GERD (gastroesophageal reflux disease)    Hyperlipidemia    Migraine with aura    Past Surgical History:  Procedure Laterality Date   CERVICAL BIOPSY  W/ LOOP ELECTRODE EXCISION  02/2003   CIN 2-3   CHOLECYSTECTOMY  2015   COSMETIC SURGERY  2009   Social History   Socioeconomic History   Marital status: Married    Spouse name: Not on file   Number of children: 0   Years of education: Not on file   Highest education level: Not on file  Occupational History    Employer: [employer]  Tobacco Use   Smoking status: Never   Smokeless tobacco: Never  Vaping Use   Vaping Use: Never used  Substance and Sexual Activity   Alcohol use: Yes    Alcohol/week: 1.0 - 2.0 standard drink    Types: 1 - 2 Glasses of wine per week    Comment: 1-2 glasses/week   Drug use: No   Sexual activity: Yes    Partners: Male    Birth control/protection: Condom  Other Topics Concern   Not on file  Social History Narrative   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    Social Determinants of Health   Financial Resource Strain: Not on file  Food Insecurity: Not on file  Transportation Needs: Not on file  Physical Activity: Not on file  Stress: Not on file  Social Connections: Not on file   No Known Allergies Family History  Problem Relation Age of Onset   Hypertension Father    Depression Father        Suicide   Hypertension Maternal Grandmother    Diabetes Maternal Grandmother    Stroke Maternal  Grandmother    Heart failure Maternal Grandfather    Diabetes Paternal 17    HIV Mother    Other Mother    Early death Mother        Age 27   Depression Paternal Grandfather        Suicide   Early death Brother        Age 49   Colon cancer Neg Hx    Colon polyps Neg Hx    Esophageal cancer Neg Hx    Rectal cancer Neg Hx    Stomach cancer Neg Hx      Current Outpatient Medications (Cardiovascular):    pravastatin (PRAVACHOL) 20 MG tablet, TAKE 1 TABLET BY MOUTH EVERY DAY     Current Outpatient Medications (Other):    desoximetasone (TOPICORT) 0.25 % cream, Use 1 X daily prn. But not long term   Multiple Vitamins-Minerals (MULTIVITAMIN PO), Take by mouth daily.   tiZANidine (ZANAFLEX) 4 MG tablet, Take 1 tablet (4 mg total) by mouth at bedtime.   traZODone (DESYREL) 100 MG tablet, TAKE 1 TABLET BY MOUTH EVERYDAY AT BEDTIME   Reviewed prior external information including notes and imaging from  primary care provider As well as notes that were available from care everywhere and  other healthcare systems.  Past medical history, social, surgical and family history all reviewed in electronic medical record.  No pertanent information unless stated regarding to the chief complaint.   Review of Systems:  No headache, visual changes, nausea, vomiting, diarrhea, constipation, dizziness, abdominal pain, skin rash, fevers, chills, night sweats, weight loss, swollen lymph nodes, body aches, joint swelling, chest pain, shortness of breath, mood changes. POSITIVE muscle aches  Objective  Height '5\' 8"'$  (1.727 m), weight 218 lb (98.9 kg).   General: No apparent distress alert and oriented x3 mood and affect normal, dressed appropriately.  HEENT: Pupils equal, extraocular movements intact  Respiratory: Patient's speak in full sentences and does not appear short of breath  Cardiovascular: No lower extremity edema, non tender, no erythema  Gait normal with good balance and  coordination.  MSK:  Non tender with full range of motion and good stability and symmetric strength and tone of shoulders, elbows, wrist, hip, knee and ankles bilaterally.     Impression and Recommendations:     The above documentation has been reviewed and is accurate and complete Lyndal Pulley, DO

## 2021-05-17 NOTE — Progress Notes (Signed)
[facility] [address] [place] Phone: [phone] Subjective:    I'm seeing this patient by the request  of:  [name], [place]  CC: Back and neck pain follow-up  [id number]  Susan [name] is a 45 y.o. female coming in with complaint of back and neck pain Patient states overall doing relatively well.  Patient is going to be traveling and wants to make sure she is doing well.  Mostly seems to be still the back.  When does occur.  Has the Zanaflex but has not needed it.  Will be traveling internationally.  Medications patient has been prescribed: Zanaflex  Taking: No         Reviewed prior external information including notes and imaging from previsou exam, outside providers and external EMR if available.   As well as notes that were available from care everywhere and other healthcare systems.  Past medical history, social, surgical and family history all reviewed in electronic medical record.  No pertanent information unless stated regarding to the chief complaint.   Past Medical History:  Diagnosis Date   Abnormal pap 02/2003   CIN 2-3   GERD (gastroesophageal reflux disease)    Hyperlipidemia    Migraine with aura     No Known Allergies   Review of Systems:  No headache, visual changes, nausea, vomiting, diarrhea, constipation, dizziness, abdominal pain, skin rash, fevers, chills, night sweats, weight loss, swollen lymph nodes, body aches, joint swelling, chest pain, shortness of breath, mood changes. POSITIVE muscle aches  Objective  Blood pressure 110/80, pulse 70, height '5\' 8"'$  (1.727 m), weight 218 lb (98.9 kg), SpO2 99 %.   General: No apparent distress alert and oriented x3 mood and affect normal, dressed appropriately.  HEENT: Pupils equal, extraocular movements intact  Respiratory: Patient's speak in full sentences and does not appear short of breath  Cardiovascular: No lower extremity edema, non  tender, no erythema  Low back exam does have some mild loss of lordosis.  Some tenderness to palpation in the paraspinal musculature.  Seems to be right greater than left.  Mild tightness of the FABER test bilaterally.  Osteopathic findings   T9 extended rotated and side bent left L2 flexed rotated and side bent right L5 flexed rotated and side bent left Sacrum right on right       Assessment and Plan:  Low back pain Patient is making significant improvement.  Patient is a little apprehensive with patient traveling.  Discussed icing regimen, home exercises, which activities to do which wants to avoid patient will be traveling.  Given prednisone in case patient has some aggravation.  Patient would avoid any type of oral anti-inflammatories and did discuss this with taking the prednisone.  Follow-up with me again in 4 to 6 weeks   Nonallopathic problems  Decision today to treat with OMT was based on Physical Exam  After verbal consent patient was treated with HVLA, ME, FPR techniques in  thoracic, lumbar, and sacral  areas  Patient tolerated the procedure well with improvement in symptoms  Patient given exercises, stretches and lifestyle modifications  See medications in patient instructions if given  Patient will follow up in 4-8 weeks      The above documentation has been reviewed and is accurate and complete Lyndal Pulley, DO        Note: This dictation was prepared with Dragon dictation along with smaller phrase technology. Any transcriptional errors that result from this  process are unintentional.

## 2021-05-17 NOTE — Patient Instructions (Signed)
Prednisone '20mg'$  for 5 days See me 5-6 weeks

## 2021-05-17 NOTE — Assessment & Plan Note (Signed)
Patient is making significant improvement.  Patient is a little apprehensive with patient traveling.  Discussed icing regimen, home exercises, which activities to do which wants to avoid patient will be traveling.  Given prednisone in case patient has some aggravation.  Patient would avoid any type of oral anti-inflammatories and did discuss this with taking the prednisone.  Follow-up with me again in 4 to 6 weeks

## 2021-05-19 ENCOUNTER — Other Ambulatory Visit: Payer: Self-pay | Admitting: Family Medicine

## 2021-06-28 ENCOUNTER — Other Ambulatory Visit (HOSPITAL_BASED_OUTPATIENT_CLINIC_OR_DEPARTMENT_OTHER): Payer: Self-pay | Admitting: Physician Assistant

## 2021-06-28 ENCOUNTER — Ambulatory Visit: Payer: BC Managed Care – PPO | Admitting: Family Medicine

## 2021-06-28 DIAGNOSIS — Z1231 Encounter for screening mammogram for malignant neoplasm of breast: Secondary | ICD-10-CM

## 2021-07-02 ENCOUNTER — Other Ambulatory Visit: Payer: Self-pay | Admitting: Physician Assistant

## 2021-07-12 ENCOUNTER — Ambulatory Visit (HOSPITAL_BASED_OUTPATIENT_CLINIC_OR_DEPARTMENT_OTHER)
Admission: RE | Admit: 2021-07-12 | Discharge: 2021-07-12 | Disposition: A | Discharge status: Home / Self Care | Payer: BC Managed Care – PPO | Source: Ambulatory Visit | Attending: Physician Assistant | Admitting: Physician Assistant

## 2021-07-12 ENCOUNTER — Encounter (HOSPITAL_BASED_OUTPATIENT_CLINIC_OR_DEPARTMENT_OTHER): Payer: Self-pay

## 2021-07-12 ENCOUNTER — Other Ambulatory Visit: Payer: Self-pay

## 2021-07-12 DIAGNOSIS — Z1231 Encounter for screening mammogram for malignant neoplasm of breast: Secondary | ICD-10-CM | POA: Diagnosis not present

## 2021-07-22 ENCOUNTER — Other Ambulatory Visit: Payer: Self-pay

## 2021-07-22 ENCOUNTER — Ambulatory Visit (INDEPENDENT_AMBULATORY_CARE_PROVIDER_SITE_OTHER): Payer: BC Managed Care – PPO | Admitting: Physician Assistant

## 2021-07-22 ENCOUNTER — Encounter: Payer: Self-pay | Admitting: Physician Assistant

## 2021-07-22 VITALS — BP 110/72 | HR 66 | Temp 98.3°F | Ht 68.0 in | Wt 217.4 lb

## 2021-07-22 DIAGNOSIS — L814 Other melanin hyperpigmentation: Secondary | ICD-10-CM | POA: Insufficient documentation

## 2021-07-22 DIAGNOSIS — G4700 Insomnia, unspecified: Secondary | ICD-10-CM | POA: Diagnosis not present

## 2021-07-22 DIAGNOSIS — E669 Obesity, unspecified: Secondary | ICD-10-CM

## 2021-07-22 DIAGNOSIS — N924 Excessive bleeding in the premenopausal period: Secondary | ICD-10-CM

## 2021-07-22 DIAGNOSIS — L111 Transient acantholytic dermatosis [Grover]: Secondary | ICD-10-CM | POA: Insufficient documentation

## 2021-07-22 DIAGNOSIS — K2100 Gastro-esophageal reflux disease with esophagitis, without bleeding: Secondary | ICD-10-CM | POA: Diagnosis not present

## 2021-07-22 DIAGNOSIS — Z0001 Encounter for general adult medical examination with abnormal findings: Secondary | ICD-10-CM | POA: Diagnosis not present

## 2021-07-22 DIAGNOSIS — E7800 Pure hypercholesterolemia, unspecified: Secondary | ICD-10-CM

## 2021-07-22 DIAGNOSIS — L578 Other skin changes due to chronic exposure to nonionizing radiation: Secondary | ICD-10-CM | POA: Insufficient documentation

## 2021-07-22 DIAGNOSIS — D239 Other benign neoplasm of skin, unspecified: Secondary | ICD-10-CM | POA: Insufficient documentation

## 2021-07-22 DIAGNOSIS — D485 Neoplasm of uncertain behavior of skin: Secondary | ICD-10-CM | POA: Insufficient documentation

## 2021-07-22 DIAGNOSIS — C44519 Basal cell carcinoma of skin of other part of trunk: Secondary | ICD-10-CM

## 2021-07-22 DIAGNOSIS — L918 Other hypertrophic disorders of the skin: Secondary | ICD-10-CM | POA: Insufficient documentation

## 2021-07-22 DIAGNOSIS — D225 Melanocytic nevi of trunk: Secondary | ICD-10-CM | POA: Insufficient documentation

## 2021-07-22 DIAGNOSIS — Z85828 Personal history of other malignant neoplasm of skin: Secondary | ICD-10-CM | POA: Insufficient documentation

## 2021-07-22 HISTORY — DX: Basal cell carcinoma of skin of other part of trunk: C44.519

## 2021-07-22 LAB — COMPREHENSIVE METABOLIC PANEL
ALT: 30 U/L (ref 0–35)
AST: 22 U/L (ref 0–37)
Albumin: 4.5 g/dL (ref 3.5–5.2)
Alkaline Phosphatase: 79 U/L (ref 39–117)
BUN: 14 mg/dL (ref 6–23)
CO2: 29 mEq/L (ref 19–32)
Calcium: 9.5 mg/dL (ref 8.4–10.5)
Chloride: 102 mEq/L (ref 96–112)
Creatinine, Ser: 0.96 mg/dL (ref 0.40–1.20)
GFR: 71.01 mL/min (ref >60.00)
Glucose, Bld: 100 mg/dL — ABNORMAL HIGH (ref 70–99)
Potassium: 3.9 mEq/L (ref 3.5–5.1)
Sodium: 139 mEq/L (ref 135–145)
Total Bilirubin: 0.5 mg/dL (ref 0.2–1.2)
Total Protein: 7.4 g/dL (ref 6.0–8.3)

## 2021-07-22 LAB — CBC WITH DIFFERENTIAL/PLATELET
Basophils Absolute: 0.1 10*3/uL (ref 0.0–0.1)
Basophils Relative: 0.7 % (ref 0.0–3.0)
Eosinophils Absolute: 0.1 10*3/uL (ref 0.0–0.7)
Eosinophils Relative: 0.9 % (ref 0.0–5.0)
HCT: 39.9 % (ref 36.0–46.0)
Hemoglobin: 13.2 g/dL (ref 12.0–15.0)
Lymphocytes Relative: 31.2 % (ref 12.0–46.0)
Lymphs Abs: 2.8 10*3/uL (ref 0.7–4.0)
MCHC: 33.1 g/dL (ref 30.0–36.0)
MCV: 91.4 fl (ref 78.0–100.0)
Monocytes Absolute: 0.4 10*3/uL (ref 0.1–1.0)
Monocytes Relative: 4.8 % (ref 3.0–12.0)
Neutro Abs: 5.6 10*3/uL (ref 1.4–7.7)
Neutrophils Relative %: 62.4 % (ref 43.0–77.0)
Platelets: 345 10*3/uL (ref 150.0–400.0)
RBC: 4.37 Mil/uL (ref 3.87–5.11)
RDW: 13.3 % (ref 11.5–15.5)
WBC: 8.9 10*3/uL (ref 4.0–10.5)

## 2021-07-22 LAB — LIPID PANEL
Cholesterol: 206 mg/dL — ABNORMAL HIGH (ref 0–200)
HDL: 56.3 mg/dL (ref >39.00)
LDL Cholesterol: 117 mg/dL — ABNORMAL HIGH (ref 0–99)
NonHDL: 149.69
Total CHOL/HDL Ratio: 4
Triglycerides: 162 mg/dL — ABNORMAL HIGH (ref 0.0–149.0)
VLDL: 32.4 mg/dL (ref 0.0–40.0)

## 2021-07-22 MED ORDER — PANTOPRAZOLE SODIUM 40 MG PO TBEC: 40.0000 mg | DELAYED_RELEASE_TABLET | Freq: Every day | ORAL | 3 refills | Status: DC

## 2021-07-22 NOTE — Progress Notes (Signed)
Subjective:    Susan [name] is a 45 y.o. female and is here for a comprehensive physical exam.   HPI  Health Maintenance Due  Topic Date Due   Pneumococcal Vaccine 48-17 Years old (1 - PCV) Never done   COVID-19 Vaccine (4 - Booster for Pfizer series) 11/08/2020    Acute Concerns: Menorrhagia Susan [name] reports she has discontinued birth control about three years ago due to having migraines with aura and increased stroke risk. Currently she states having cycles with  abnormally heavy flows and are increasingly becoming debilitating. It's gotten to the point where she has to change menstrual pads every 2-3 hours and feeling more fatigue. Prior to starting her birth control she experienced menstrual cycles with light flows and normal cramps and occurring every 28-30 days,  Denies dizziness or lightheadedness  Chronic Issues: HLD Currently compliant with taking pravastatin 20 mg with no adverse effects. She is managing well.   GERD Currently taking OTC Prilosec 20 mg daily with no adverse effects although she states it is becoming expensive and ineffective. According to [facility], she was on a medication, that she can't remember the name of, by a past provider that she found helpful and affordable. Reports currently she experiences reflux daily and it feels like third degree burns in her throat. Lately she has noticed the prilosec is not being as effective as it once was but it's still beneficial.   Insomnia Currently compliant with taking trazodone 100 mg with no adverse effects. Tunisha has stated she's been getting enough sleep but admits she is more irritable. Besides irritability she is managing well.   Health Maintenance: Immunizations -- COVID- Not completed Influenza Vaccine- Last completed 05/26/20 Tdap- Last completed 05/26/20 Colonoscopy -- Last completed 10/13/20 Mammogram -- Last completed 07/12/21 PAP -- Last completed 07/21/20 Bone Density -- N/A Diet --  Well balanced- feels she doesn't over eat Sleep habits -- Sleeping regularly  Exercise -- Tries to walk around the neighborhood Weight -- Stable Mood -- Stable despite normal life stressors Weight history: Wt Readings from Last 10 Encounters:  07/22/21 217 lb 6.1 oz (98.6 kg)  05/17/21 218 lb (98.9 kg)  03/07/21 220 lb (99.8 kg)  02/22/21 220 lb (99.8 kg)  12/07/20 221 lb (100.2 kg)  10/13/20 221 lb (100.2 kg)  09/29/20 221 lb (100.2 kg)  09/15/20 221 lb (100.2 kg)  08/03/20 210 lb (95.3 kg)  07/21/20 215 lb 4 oz (97.6 kg)   Body mass index is 33.05 kg/m. Patient's last menstrual period was 07/17/2021. Alcohol use:  reports current alcohol use of about 2.0 standard drinks per week. Tobacco use: Never Tobacco Use: Low Risk    Smoking Tobacco Use: Never   Smokeless Tobacco Use: Never   Passive Exposure: Not on file     Depression screen PHQ 2/9 07/22/2021  Decreased Interest 0  Down, Depressed, Hopeless 0  PHQ - 2 Score 0     Other providers/specialists: Patient Care Team: [name], [place] as PCP - General (Physician Assistant)    PMHx, SurgHx, SocialHx, Medications, and Allergies were reviewed in the Visit Navigator and updated as appropriate.   Past Medical History:  Diagnosis Date   Abnormal pap 02/2003   CIN 2-3   GERD (gastroesophageal reflux disease)    Hyperlipidemia    Migraine with aura      Past Surgical History:  Procedure Laterality Date   CERVICAL BIOPSY  W/ LOOP ELECTRODE EXCISION  02/2003   CIN 2-3  CHOLECYSTECTOMY  2015   COSMETIC SURGERY  2009     Family History  Problem Relation Age of Onset   Hypertension Father    Depression Father        Suicide   Hypertension Maternal Grandmother    Diabetes Maternal Grandmother    Stroke Maternal Grandmother    Heart failure Maternal Grandfather    Diabetes Paternal Grandmother    HIV Mother    Other Mother    Early death Mother        Age 59   Depression Paternal Grandfather         Suicide   Early death Brother        Age 58   Drug abuse Brother    Colon cancer Neg Hx    Colon polyps Neg Hx    Esophageal cancer Neg Hx    Rectal cancer Neg Hx    Stomach cancer Neg Hx     Social History   Tobacco Use   Smoking status: Never   Smokeless tobacco: Never  Vaping Use   Vaping Use: Never used  Substance Use Topics   Alcohol use: Yes    Alcohol/week: 2.0 standard drinks    Types: 2 Glasses of wine per week    Comment: 1-2 glasses/week   Drug use: No    Review of Systems:   Review of Systems  Constitutional:  Negative for chills, fever, malaise/fatigue and weight loss.  HENT:  Negative for hearing loss, sinus pain and sore throat.   Respiratory:  Negative for cough and hemoptysis.   Cardiovascular:  Negative for chest pain, palpitations, leg swelling and PND.  Gastrointestinal:  Positive for heartburn. Negative for abdominal pain, constipation, diarrhea, nausea and vomiting.  Genitourinary:  Negative for dysuria, frequency and urgency.  Musculoskeletal:  Negative for back pain, myalgias and neck pain.  Skin:  Negative for itching and rash.  Neurological:  Negative for dizziness, tingling, seizures and headaches.  Endo/Heme/Allergies:  Negative for polydipsia.  Psychiatric/Behavioral:  Negative for depression. The patient is not nervous/anxious.    Objective:   BP 110/72 (BP Location: Left Arm, Patient Position: Sitting, Cuff Size: Large)   Pulse 66   Temp 98.3 F (36.8 C) (Temporal)   Ht 5\' 8"  (1.727 m)   Wt 217 lb 6.1 oz (98.6 kg)   LMP 07/17/2021   SpO2 97%   BMI 33.05 kg/m  Body mass index is 33.05 kg/m.   General Appearance:    Alert, cooperative, no distress, appears stated age  Head:    Normocephalic, without obvious abnormality, atraumatic  Eyes:    PERRL, conjunctiva/corneas clear, EOM's intact, fundi    benign, both eyes  Ears:    Normal TM's and external ear canals, both ears  Nose:   Nares normal, septum midline, mucosa normal, no  drainage    or sinus tenderness  Throat:   Lips, mucosa, and tongue normal; teeth and gums normal  Neck:   Supple, symmetrical, trachea midline, no adenopathy;    thyroid:  no enlargement/tenderness/nodules; no carotid   bruit or JVD  Back:     Symmetric, no curvature, ROM normal, no CVA tenderness  Lungs:     Clear to auscultation bilaterally, respirations unlabored  Chest Wall:    No tenderness or deformity   Heart:    Regular rate and rhythm, S1 and S2 normal, no murmur, rub or gallop  Breast Exam:    Deferred  Abdomen:     Soft, non-tender, bowel  sounds active all four quadrants,    no masses, no organomegaly  Genitalia:    Deferred  Extremities:   Extremities normal, atraumatic, no cyanosis or edema  Pulses:   2+ and symmetric all extremities  Skin:   Skin color, texture, turgor normal, no rashes or lesions  Lymph nodes:   Cervical, supraclavicular, and axillary nodes normal  Neurologic:   CNII-XII intact, normal strength, sensation and reflexes    throughout    Assessment/Plan:   Encounter for general adult medical examination with abnormal findings Today patient counseled on age appropriate routine health concerns for screening and prevention, each reviewed and up to date or declined. Immunizations reviewed and up to date or declined. Labs ordered and reviewed. Risk factors for depression reviewed and negative. Hearing function and visual acuity are intact. ADLs screened and addressed as needed. Functional ability and level of safety reviewed and appropriate. Education, counseling and referrals performed based on assessed risks today. Patient provided with a copy of personalized plan for preventive services.   Gastroesophageal reflux disease with esophagitis without hemorrhage Uncontrolled Trial protonix 40 mg daily Follow if lack of improvement or any worsening  Insomnia, unspecified type Controlled Continue trazodone 100 mg daily  Hypercholesterolemia Update lipid panel  and adjust pravastatin 20 mg daily  Obesity, unspecified classification, unspecified obesity type, unspecified whether serious comorbidity present Continue to work on healthy eating and exercise  Excessive bleeding in premenopausal period Update blood work to check for anemia Referral to gyn     Patient Counseling: [x]    Nutrition: Stressed importance of moderation in sodium/caffeine intake, saturated fat and cholesterol, caloric balance, sufficient intake of fresh fruits, vegetables, fiber, calcium, iron, and 1 mg of folate supplement per day (for females capable of pregnancy).  [x]    Stressed the importance of regular exercise.   [x]    Substance Abuse: Discussed cessation/primary prevention of tobacco, alcohol, or other drug use; driving or other dangerous activities under the influence; availability of treatment for abuse.   [x]    Injury prevention: Discussed safety belts, safety helmets, smoke detector, smoking near bedding or upholstery.   [x]    Sexuality: Discussed sexually transmitted diseases, partner selection, use of condoms, avoidance of unintended pregnancy  and contraceptive alternatives.  [x]    Dental health: Discussed importance of regular tooth brushing, flossing, and dental visits.  [x]    Health maintenance and immunizations reviewed. Please refer to Health maintenance section.   I,Havlyn C Ratchford,acting as a Education administrator for [employer], PA.,have documented all relevant documentation on the behalf of [name], PA,as directed by  [name], PA while in the presence of [name], [place].  I, [name], [place], have reviewed all documentation for this visit. The documentation on 07/22/21 for the exam, diagnosis, procedures, and orders are all accurate and complete.   [name], PA-C [place]

## 2021-07-22 NOTE — Patient Instructions (Addendum)
It was great to see you!  Start 40 mg protonix daily -- if not effective after two weeks, please let us know  You will be contacted about gynecology referral  Please go to the lab for blood work.   Our office will call you with your results unless you have chosen to receive results via MyChart.  If your blood work is normal we will follow-up each year for physicals and as scheduled for chronic medical problems.  If anything is abnormal we will treat accordingly and get you in for a follow-up.  Take care,  Aldona Bar

## 2021-07-23 LAB — IRON,TIBC AND FERRITIN PANEL
%SAT: 14 % (calc) — ABNORMAL LOW (ref 16–45)
Ferritin: 15 ng/mL — ABNORMAL LOW (ref 16–232)
Iron: 56 ug/dL (ref 40–190)
TIBC: 406 mcg/dL (calc) (ref 250–450)

## 2021-07-24 ENCOUNTER — Other Ambulatory Visit: Payer: Self-pay | Admitting: Physician Assistant

## 2021-07-24 MED ORDER — PRAVASTATIN SODIUM 40 MG PO TABS: 40.0000 mg | ORAL_TABLET | Freq: Every day | ORAL | 3 refills | Status: DC

## 2021-07-28 ENCOUNTER — Ambulatory Visit: Payer: BC Managed Care – PPO | Admitting: Family Medicine

## 2021-08-15 ENCOUNTER — Other Ambulatory Visit: Payer: Self-pay | Admitting: Family Medicine

## 2021-09-01 NOTE — Progress Notes (Signed)
[facility] [place] [address] [place] Phone: [phone] Subjective:   Susan [name], am serving as a scribe for Dr. Hulan Saas. This visit occurred during the SARS-CoV-2 public health emergency.  Safety protocols were in place, including screening questions prior to the visit, additional usage of staff PPE, and extensive cleaning of exam room while observing appropriate contact time as indicated for disinfecting solutions.   I'm seeing this patient by the request  of:  [name], [place]  CC: Back and neck pain follow-up  [id number]  Susan [name] is a 45 y.o. female coming in with complaint of back and neck pain. OMT on 05/17/2021. Patient states that she has been doing well.  Overall nothing significant.  Has used muscle relaxer couple times but nothing severe.  Medications patient has been prescribed: Zanaflex  Taking: Very sparingly         Reviewed prior external information including notes and imaging from previsou exam, outside providers and external EMR if available.   As well as notes that were available from care everywhere and other healthcare systems.  Past medical history, social, surgical and family history all reviewed in electronic medical record.  No pertanent information unless stated regarding to the chief complaint.   Past Medical History:  Diagnosis Date   Abnormal pap 02/2003   CIN 2-3   Basal cell carcinoma of back 07/22/2021   GERD (gastroesophageal reflux disease)    Hyperlipidemia    Migraine with aura     No Known Allergies   Review of Systems:  No headache, visual changes, nausea, vomiting, diarrhea, constipation, dizziness, abdominal pain, skin rash, fevers, chills, night sweats, weight loss, swollen lymph nodes, body aches, joint swelling, chest pain, shortness of breath, mood changes. POSITIVE muscle aches  Objective  Blood pressure 118/82, pulse 66, height 5\' 8"  (1.727 m), weight  224 lb (101.6 kg), SpO2 99 %.   General: No apparent distress alert and oriented x3 mood and affect normal, dressed appropriately.  HEENT: Pupils equal, extraocular movements intact  Respiratory: Patient's speak in full sentences and does not appear short of breath  Cardiovascular: No lower extremity edema, non tender, no erythema  Low back exam does have some mild loss of lordosis.  Neck exam also has some very mild loss of lordosis.  Tightness noted more in the right parascapular region  Osteopathic findings C6 flexed rotated and side bent left T3 extended rotated and side bent right inhaled rib T9 extended rotated and side bent left L2 flexed rotated and side bent right Sacrum right on right       Assessment and Plan:  Low back pain Chronic, with mild exacerbation. Tightness noted.  Is responding well to osteopathic manipulation.  He does have Zanaflex for breakthrough.  Has been doing well with vitamin D supplementation.  Follow-up again in 2 months   Nonallopathic problems  Decision today to treat with OMT was based on Physical Exam  After verbal consent patient was treated with HVLA, ME, FPR techniques in cervical, rib, thoracic, lumbar, and sacral  areas  Patient tolerated the procedure well with improvement in symptoms  Patient given exercises, stretches and lifestyle modifications  See medications in patient instructions if given  Patient will follow up in 4-8 weeks      The above documentation has been reviewed and is accurate and complete Lyndal Pulley, DO       Note: This dictation was prepared with Dragon dictation along  with smaller phrase technology. Any transcriptional errors that result from this process are unintentional.

## 2021-09-02 ENCOUNTER — Encounter: Payer: Self-pay | Admitting: Family Medicine

## 2021-09-02 ENCOUNTER — Other Ambulatory Visit: Payer: Self-pay

## 2021-09-02 ENCOUNTER — Ambulatory Visit (INDEPENDENT_AMBULATORY_CARE_PROVIDER_SITE_OTHER): Payer: BC Managed Care – PPO | Admitting: Family Medicine

## 2021-09-02 VITALS — BP 118/82 | HR 66 | Ht 68.0 in | Wt 224.0 lb

## 2021-09-02 DIAGNOSIS — M9904 Segmental and somatic dysfunction of sacral region: Secondary | ICD-10-CM

## 2021-09-02 DIAGNOSIS — G8929 Other chronic pain: Secondary | ICD-10-CM

## 2021-09-02 DIAGNOSIS — M9908 Segmental and somatic dysfunction of rib cage: Secondary | ICD-10-CM | POA: Diagnosis not present

## 2021-09-02 DIAGNOSIS — M9902 Segmental and somatic dysfunction of thoracic region: Secondary | ICD-10-CM | POA: Diagnosis not present

## 2021-09-02 DIAGNOSIS — M9903 Segmental and somatic dysfunction of lumbar region: Secondary | ICD-10-CM

## 2021-09-02 DIAGNOSIS — M9901 Segmental and somatic dysfunction of cervical region: Secondary | ICD-10-CM

## 2021-09-02 DIAGNOSIS — M545 Low back pain, unspecified: Secondary | ICD-10-CM | POA: Diagnosis not present

## 2021-09-02 NOTE — Assessment & Plan Note (Signed)
Chronic, with mild exacerbation. Tightness noted.  Is responding well to osteopathic manipulation.  He does have Zanaflex for breakthrough.  Has been doing well with vitamin D supplementation.  Follow-up again in 2 months

## 2021-09-02 NOTE — Patient Instructions (Signed)
You are doing great! Enjoy your Bronco See me in 2-3 months

## 2021-10-17 ENCOUNTER — Other Ambulatory Visit: Payer: Self-pay | Admitting: Physician Assistant

## 2021-10-24 ENCOUNTER — Ambulatory Visit: Payer: Self-pay

## 2021-10-24 ENCOUNTER — Ambulatory Visit (INDEPENDENT_AMBULATORY_CARE_PROVIDER_SITE_OTHER): Payer: BC Managed Care – PPO

## 2021-10-24 ENCOUNTER — Encounter: Payer: Self-pay | Admitting: Family Medicine

## 2021-10-24 ENCOUNTER — Ambulatory Visit (INDEPENDENT_AMBULATORY_CARE_PROVIDER_SITE_OTHER): Payer: BC Managed Care – PPO | Admitting: Family Medicine

## 2021-10-24 ENCOUNTER — Other Ambulatory Visit: Payer: Self-pay

## 2021-10-24 VITALS — BP 122/84 | HR 78 | Ht 68.0 in | Wt 226.0 lb

## 2021-10-24 DIAGNOSIS — M79605 Pain in left leg: Secondary | ICD-10-CM

## 2021-10-24 DIAGNOSIS — M7989 Other specified soft tissue disorders: Secondary | ICD-10-CM | POA: Diagnosis not present
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
## 2021-10-24 NOTE — Patient Instructions (Addendum)
Nice to meet you today.  Use compression (compression stocking) on your L foot, ankle and lower leg.  If you need it, you can purchase a walking boot at [facility] supply.  Please perform the exercise program that we have prepared for you and gone over in detail on a daily basis.  In addition to the handout you were provided you can access your program through: [email or web address]   Your unique program code is:   [email or web address]  Follow-up: 2 weeks

## 2021-10-25 NOTE — Progress Notes (Signed)
Left ankle x-ray shows swelling but otherwise looks okay

## 2021-10-25 NOTE — Progress Notes (Signed)
Left lower leg x-ray shows no fractures.  Soft tissue swelling is present.
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
## 2021-11-03 ENCOUNTER — Ambulatory Visit (INDEPENDENT_AMBULATORY_CARE_PROVIDER_SITE_OTHER): Payer: BC Managed Care – PPO | Admitting: Family Medicine

## 2021-11-03 ENCOUNTER — Other Ambulatory Visit: Payer: Self-pay

## 2021-11-03 ENCOUNTER — Ambulatory Visit: Payer: Self-pay

## 2021-11-03 ENCOUNTER — Encounter: Payer: Self-pay | Admitting: Family Medicine

## 2021-11-03 VITALS — BP 120/88 | HR 65 | Ht 68.0 in | Wt 226.0 lb

## 2021-11-03 DIAGNOSIS — S8012XA Contusion of left lower leg, initial encounter: Secondary | ICD-10-CM | POA: Diagnosis not present

## 2021-11-03 DIAGNOSIS — M25562 Pain in left knee: Secondary | ICD-10-CM | POA: Diagnosis not present

## 2021-11-03 MED ORDER — DOXYCYCLINE HYCLATE 100 MG PO TABS: 100.0000 mg | ORAL_TABLET | Freq: Two times a day (BID) | ORAL | 0 refills | Status: DC

## 2021-11-03 NOTE — Patient Instructions (Addendum)
Doxy 2x a day for 7 days Redness, loss of color of leg, loss of pulse, or coldness -seek medical attention immediately Arnica lotion Continue compression socks Ok to double book in 2 weeks

## 2021-11-03 NOTE — Assessment & Plan Note (Addendum)
Patient does have a traumatic hematoma attempted aspiration of the hematoma but secondary to the coagulation of the blood already we were unable to get anything significant out.  Discussed with patient that there is always a risk of potential infectious etiology so we will start on doxycycline as well.  Patient is not having significant pain with walking and seems to be more on the ankles at this time.  Discussed with patient about compression still, elevation, topical anti-inflammatories that I think can be helpful.  Discussed monitoring for any pulselessness, loss of pallor, or significant amount of pain to seek medical attention immediately.  Patient otherwise we will follow-up again in 2 weeks and we will likely old ultrasound again at that time

## 2021-11-07 ENCOUNTER — Ambulatory Visit: Payer: BC Managed Care – PPO | Admitting: Family Medicine

## 2021-11-15 NOTE — Progress Notes (Signed)
?Charlann Boxer D.O. ?[facility] ?[place] ?Phone: [phone] ?Subjective:   ?I, Susan [name], am serving as a Education administrator for Dr. Hulan Saas. ?This visit occurred during the SARS-CoV-2 public health emergency.  Safety protocols were in place, including screening questions prior to the visit, additional usage of staff PPE, and extensive cleaning of exam room while observing appropriate contact time as indicated for disinfecting solutions.  ? ?I'm seeing this patient by the request  of:  [name], [place] ? ?CC: Leg injury follow-up ? ?[id number]  ?11/03/2021 ?Traumatic hematoma of lower leg, left, initial encounter ?Patient does have a traumatic hematoma attempted aspiration of the hematoma but secondary to the coagulation of the blood already we were unable to get anything significant out.  Discussed with patient that there is always a risk of potential infectious etiology so we will start on doxycycline as well.  Patient is not having significant pain with walking and seems to be more on the ankles at this time.  Discussed with patient about compression still, elevation, topical anti-inflammatories that I think can be helpful.  Discussed monitoring for any pulselessness, loss of pallor, or significant amount of pain to seek medical attention immediately.  Patient otherwise we will follow-up again in 2 weeks and we will likely old ultrasound again at that time ? ?Update 11/16/2021 ?Susan [name] is a 46 y.o. female coming in with complaint of L leg pain. Patient states doing a lot better. Still some brusing and tenderness around ankle and top of foot. Still tightness in shin in the morning, but it goes away. Now right knee is a little uncomfortable. When sitting when legs cross its uncomfortable when getting up. Pain located on medial side.  ? ?  ? ?Past Medical History:  ?Diagnosis Date  ? Abnormal pap 02/2003  ? CIN 2-3  ? Basal cell carcinoma of back 07/22/2021  ?  GERD (gastroesophageal reflux disease)   ? Hyperlipidemia   ? Migraine with aura   ? ?Past Surgical History:  ?Procedure Laterality Date  ? CERVICAL BIOPSY  W/ LOOP ELECTRODE EXCISION  02/2003  ? CIN 2-3  ? CHOLECYSTECTOMY  2015  ? COSMETIC SURGERY  2009  ? ?Social History  ? ?Socioeconomic History  ? Marital status: Married  ?  Spouse name: Not on file  ? Number of children: 0  ? Years of education: Not on file  ? Highest education level: Not on file  ?Occupational History  ?  Employer: [employer]  ?Tobacco Use  ? Smoking status: Never  ? Smokeless tobacco: Never  ?Vaping Use  ? Vaping Use: Never used  ?Substance and Sexual Activity  ? Alcohol use: Yes  ?  Alcohol/week: 2.0 standard drinks  ?  Types: 2 Glasses of wine per week  ?  Comment: 1-2 glasses/week  ? Drug use: No  ? Sexual activity: Yes  ?  Partners: Male  ?  Birth control/protection: Condom  ?Other Topics Concern  ? Not on file  ?Social History Narrative  ? Married.   ? No children.   ? ?Social Determinants of Health  ? ?Financial Resource Strain: Not on file  ?Food Insecurity: Not on file  ?Transportation Needs: Not on file  ?Physical Activity: Not on file  ?Stress: Not on file  ?Social Connections: Not on file  ? ?No Known Allergies ?Family History  ?Problem Relation Age of Onset  ? Hypertension Father   ? Depression Father   ?  Suicide  ? Hypertension Maternal Grandmother   ? Diabetes Maternal Grandmother   ? Stroke Maternal Grandmother   ? Heart failure Maternal Grandfather   ? Diabetes Paternal Grandmother   ? HIV Mother   ? Other Mother   ? Early death Mother   ?     Age 38  ? Depression Paternal Grandfather   ?     Suicide  ? Early death Brother   ?     Age 55  ? Drug abuse Brother   ? Colon cancer Neg Hx   ? Colon polyps Neg Hx   ? Esophageal cancer Neg Hx   ? Rectal cancer Neg Hx   ? Stomach cancer Neg Hx   ? ? ? ?Current Outpatient Medications (Cardiovascular):  ?  pravastatin (PRAVACHOL) 40 MG tablet, Take 1 tablet (40 mg total) by mouth  daily. ? ? ? ? ?Current Outpatient Medications (Other):  ?  desoximetasone (TOPICORT) 0.25 % cream, Use 1 X daily prn. But not long term ?  doxycycline (VIBRA-TABS) 100 MG tablet, Take 1 tablet (100 mg total) by mouth 2 (two) times daily. ?  Multiple Vitamins-Minerals (MULTIVITAMIN PO), Take by mouth daily. ?  pantoprazole (PROTONIX) 40 MG tablet, TAKE 1 TABLET BY MOUTH EVERY DAY ?  TART CHERRY PO, Take by mouth. ?  tiZANidine (ZANAFLEX) 4 MG tablet, TAKE 1 TABLET BY MOUTH EVERYDAY AT BEDTIME ?  traZODone (DESYREL) 100 MG tablet, TAKE 1 TABLET BY MOUTH EVERYDAY AT BEDTIME ?  Vitamin D 12.5 MCG/0.25ML LIQD, Take by mouth. ? ? ?Reviewed prior external information including notes and imaging from  ?primary care provider ?As well as notes that were available from care everywhere and other healthcare systems. ? ?Past medical history, social, surgical and family history all reviewed in electronic medical record.  No pertanent information unless stated regarding to the chief complaint.  ? ?Review of Systems: ? No headache, visual changes, nausea, vomiting, diarrhea, constipation, dizziness, abdominal pain, skin rash, fevers, chills, night sweats, weight loss, swollen lymph nodes, body aches, joint swelling, chest pain, shortness of breath, mood changes. POSITIVE muscle aches ? ?Objective  ?Blood pressure 122/84, pulse 66, height 5\' 8"  (1.727 m), weight 218 lb (98.9 kg), SpO2 99 %. ?  ?General: No apparent distress alert and oriented x3 mood and affect normal, dressed appropriately.  ?HEENT: Pupils equal, extraocular movements intact  ?Respiratory: Patient's speak in full sentences and does not appear short of breath  ?Cardiovascular: No lower extremity edema, non tender, no erythema  ?Gait relatively normal ?Left leg exam still shows the patient does have swelling compared to the contralateral side.  Patient's lower muscle especially on the lateral aspect seems not as taut.  Patient is still has tightness noted on the  anterior medial aspect of the tibia.  Patient neurovascularly is intact.  Good capillary refill distally.  5 out of 5 strength of the ankle. ? ?Limited muscular skeletal ultrasound was performed and interpreted by Hulan Saas, M  ?Limited ultrasound of patient's anterior medial lower leg does show the patient still has accumulation consistent with a hematoma.  No abnormal blood flow noted in the area.  Minimal change in size. ?Impression: Traumatic hematoma of the lower extremity still noted. ? ?  ?Impression and Recommendations:  ?  ? ?The above documentation has been reviewed and is accurate and complete Lyndal Pulley, DO ? ? ?

## 2021-11-16 ENCOUNTER — Other Ambulatory Visit: Payer: Self-pay | Admitting: Family Medicine

## 2021-11-17 ENCOUNTER — Ambulatory Visit: Payer: Self-pay

## 2021-11-17 ENCOUNTER — Other Ambulatory Visit: Payer: Self-pay

## 2021-11-17 ENCOUNTER — Ambulatory Visit (INDEPENDENT_AMBULATORY_CARE_PROVIDER_SITE_OTHER): Payer: BC Managed Care – PPO | Admitting: Family Medicine

## 2021-11-17 VITALS — BP 122/84 | HR 66 | Ht 68.0 in | Wt 218.0 lb

## 2021-11-17 DIAGNOSIS — S8012XA Contusion of left lower leg, initial encounter: Secondary | ICD-10-CM | POA: Diagnosis not present

## 2021-11-17 NOTE — Patient Instructions (Addendum)
Referral [facility] ?See you again in 6-8 weeks, or when you need me ?

## 2021-11-17 NOTE — Assessment & Plan Note (Signed)
Continued hematoma noted.  We discussed the possibility of repeat aspiration but I feel like this will become much more serial and likely not make significant improvement and possible need for a short-term drain being placed could be more beneficial.  I would like to refer patient to orthopedic surgery to discuss.  Patient is doing well with the pain aspect but I would state that the swelling has not made any significant improvement at this point. ?

## 2021-11-19 DIAGNOSIS — S8012XA Contusion of left lower leg, initial encounter: Secondary | ICD-10-CM | POA: Diagnosis not present

## 2022-01-04 NOTE — Progress Notes (Signed)
?Charlann Boxer D.O. ?[facility] ?[place] ?Phone: [phone] ?Subjective:   ? ?I'm seeing this patient by the request  of:  [name], [place] ? ?CC: Leg pain follow-up, back and neck pain follow-up ? ?[id number]  ?11/17/2021 ?Continued hematoma noted.  We discussed the possibility of repeat aspiration but I feel like this will become much more serial and likely not make significant improvement and possible need for a short-term drain being placed could be more beneficial.  I would like to refer patient to orthopedic surgery to discuss.  Patient is doing well with the pain aspect but I would state that the swelling has not made any significant improvement at this point. ? ?Updated 01/05/2022 ?Susan [name] is a 46 y.o. female coming in with complaint of leg pain is doing good. Leg is still a little swollen but this is the first week she has been able to not wear the compression stocking, but her plantar fasciitis is flared up now for about 2 months.  Patient feels like it could be secondary to how she did compensate for her leg initially.  Also has not been quite as active as she would like to be.  Patient describes the pain as a dull, throbbing aching sensation.  Feels like her neck seems to be more of the difficulty aspect at this time. ? ? ? ?  ? ?Past Medical History:  ?Diagnosis Date  ? Abnormal pap 02/2003  ? CIN 2-3  ? Basal cell carcinoma of back 07/22/2021  ? GERD (gastroesophageal reflux disease)   ? Hyperlipidemia   ? Migraine with aura   ? ?Past Surgical History:  ?Procedure Laterality Date  ? CERVICAL BIOPSY  W/ LOOP ELECTRODE EXCISION  02/2003  ? CIN 2-3  ? CHOLECYSTECTOMY  2015  ? COSMETIC SURGERY  2009  ? ?Social History  ? ?Socioeconomic History  ? Marital status: Married  ?  Spouse name: Not on file  ? Number of children: 0  ? Years of education: Not on file  ? Highest education level: Not on file  ?Occupational History  ?  Employer: [employer]   ?Tobacco Use  ? Smoking status: Never  ? Smokeless tobacco: Never  ?Vaping Use  ? Vaping Use: Never used  ?Substance and Sexual Activity  ? Alcohol use: Yes  ?  Alcohol/week: 2.0 standard drinks  ?  Types: 2 Glasses of wine per week  ?  Comment: 1-2 glasses/week  ? Drug use: No  ? Sexual activity: Yes  ?  Partners: Male  ?  Birth control/protection: Condom  ?Other Topics Concern  ? Not on file  ?Social History Narrative  ? Married.   ? No children.   ? ?Social Determinants of Health  ? ?Financial Resource Strain: Not on file  ?Food Insecurity: Not on file  ?Transportation Needs: Not on file  ?Physical Activity: Not on file  ?Stress: Not on file  ?Social Connections: Not on file  ? ?No Known Allergies ?Family History  ?Problem Relation Age of Onset  ? Hypertension Father   ? Depression Father   ?     Suicide  ? Hypertension Maternal Grandmother   ? Diabetes Maternal Grandmother   ? Stroke Maternal Grandmother   ? Heart failure Maternal Grandfather   ? Diabetes Paternal Grandmother   ? HIV Mother   ? Other Mother   ? Early death Mother   ?     Age 70  ? Depression Paternal Grandfather   ?  Suicide  ? Early death Brother   ?     Age 48  ? Drug abuse Brother   ? Colon cancer Neg Hx   ? Colon polyps Neg Hx   ? Esophageal cancer Neg Hx   ? Rectal cancer Neg Hx   ? Stomach cancer Neg Hx   ? ? ? ?Current Outpatient Medications (Cardiovascular):  ?  pravastatin (PRAVACHOL) 40 MG tablet, Take 1 tablet (40 mg total) by mouth daily. ? ? ? ? ?Current Outpatient Medications (Other):  ?  desoximetasone (TOPICORT) 0.25 % cream, Use 1 X daily prn. But not long term ?  Multiple Vitamins-Minerals (MULTIVITAMIN PO), Take by mouth daily. ?  pantoprazole (PROTONIX) 40 MG tablet, TAKE 1 TABLET BY MOUTH EVERY DAY ?  TART CHERRY PO, Take by mouth. ?  tiZANidine (ZANAFLEX) 4 MG tablet, TAKE 1 TABLET BY MOUTH EVERYDAY AT BEDTIME ?  traZODone (DESYREL) 100 MG tablet, TAKE 1 TABLET BY MOUTH EVERYDAY AT BEDTIME ?  Vitamin D 12.5 MCG/0.25ML  LIQD, Take by mouth. ?  doxycycline (VIBRA-TABS) 100 MG tablet, Take 1 tablet (100 mg total) by mouth 2 (two) times daily. (Patient not taking: Reported on 01/05/2022) ? ? ?Reviewed prior external information including notes and imaging from  ?primary care provider ?As well as notes that were available from care everywhere and other healthcare systems. ? ?Past medical history, social, surgical and family history all reviewed in electronic medical record.  No pertanent information unless stated regarding to the chief complaint.  ? ?Review of Systems: ? No headache, visual changes, nausea, vomiting, diarrhea, constipation, dizziness, abdominal pain, skin rash, fevers, chills, night sweats, weight loss, swollen lymph nodes, body aches, joint swelling, chest pain, shortness of breath, mood changes. POSITIVE muscle aches ? ?Objective  ?Blood pressure 130/84, pulse 70, height '5\' 8"'$  (1.727 m), weight 220 lb (99.8 kg), SpO2 98 %. ?  ?General: No apparent distress alert and oriented x3 mood and affect normal, dressed appropriately.  ?HEENT: Pupils equal, extraocular movements intact  ?Respiratory: Patient's speak in full sentences and does not appear short of breath  ?Cardiovascular: No lower extremity edema, non tender, no erythema  ?Gait normal with good balance and coordination.  ?MSK: Left calf still minorly enlarged compared to the contralateral side.  No significant bruising no noted.  Full range of motion of the leg.  Less swelling than previously. ?Back exam though does have significant loss of lordosis.  Some tightness noted with significant tightness with FABER test bilaterally.  Negative straight leg test. ? ?Osteopathic findings ? ?T6 extended rotated and side bent left ?L2 flexed rotated and side bent right ?Sacrum left on left ? ?  ?Impression and Recommendations:  ?  ? ?The above documentation has been reviewed and is accurate and complete Lyndal Pulley, DO ? ? ? ?

## 2022-01-05 ENCOUNTER — Ambulatory Visit (INDEPENDENT_AMBULATORY_CARE_PROVIDER_SITE_OTHER): Payer: BC Managed Care – PPO | Admitting: Family Medicine

## 2022-01-05 VITALS — BP 130/84 | HR 70 | Ht 68.0 in | Wt 220.0 lb

## 2022-01-05 DIAGNOSIS — M999 Biomechanical lesion, unspecified: Secondary | ICD-10-CM | POA: Diagnosis not present

## 2022-01-05 DIAGNOSIS — S8012XA Contusion of left lower leg, initial encounter: Secondary | ICD-10-CM

## 2022-01-05 DIAGNOSIS — M9903 Segmental and somatic dysfunction of lumbar region: Secondary | ICD-10-CM

## 2022-01-05 DIAGNOSIS — M545 Low back pain, unspecified: Secondary | ICD-10-CM | POA: Diagnosis not present

## 2022-01-05 DIAGNOSIS — G8929 Other chronic pain: Secondary | ICD-10-CM

## 2022-01-05 DIAGNOSIS — M9904 Segmental and somatic dysfunction of sacral region: Secondary | ICD-10-CM

## 2022-01-05 DIAGNOSIS — M9902 Segmental and somatic dysfunction of thoracic region: Secondary | ICD-10-CM | POA: Diagnosis not present

## 2022-01-05 NOTE — Patient Instructions (Signed)
Great to see you as always ?Stay active and increase activity  ?Still wear compression socks when possible  ?See me again in 6 weeks ?

## 2022-01-06 NOTE — Assessment & Plan Note (Signed)
Known chronic problem but overall seems to be doing decent.  Discussed which activities that could be beneficial and which ones would not.  With patient now finally improving to a certain degree would like patient to increase activity and see how patient responds.  Encouraged her to continue to work on weight loss.  Follow-up again in 6 to 8 weeks otherwise. ?

## 2022-01-06 NOTE — Assessment & Plan Note (Signed)
Patient feels like she continues to improve.  We discussed the possibility again of possible drainage or possible aspiration or surgical intervention.  Patient wants to continue with the conservative therapy at the moment.  Discussed icing regimen and home exercises.  Discussed which activities to do which ones to avoid.  Increase activity slowly.  Follow-up again in 6 to 8 weeks otherwise. ?

## 2022-01-06 NOTE — Assessment & Plan Note (Signed)

## 2022-01-14 ENCOUNTER — Other Ambulatory Visit: Payer: Self-pay | Admitting: Physician Assistant

## 2022-02-13 ENCOUNTER — Other Ambulatory Visit: Payer: Self-pay | Admitting: Family Medicine

## 2022-02-15 NOTE — Progress Notes (Unsigned)
[place] [facility] Phone: [phone] Subjective:   [name], am serving as a scribe for Dr. Hulan Saas.   I'm seeing this patient by the request  of:  [name], [place]  CC: Low back and neck pain follow-up  [id number]  Susan [name] is a 46 y.o. female coming in with complaint of back and neck pain. OMT on 01/05/2022. Patient states that she has been doing well. Does still have swelling around the ankle otherwise leg is improving. Plantar fasciitis has improved.   Medications patient has been prescribed: Zanaflex  Taking:         Reviewed prior external information including notes and imaging from previsou exam, outside providers and external EMR if available.   As well as notes that were available from care everywhere and other healthcare systems.  Past medical history, social, surgical and family history all reviewed in electronic medical record.  No pertanent information unless stated regarding to the chief complaint.   Past Medical History:  Diagnosis Date   Abnormal pap 02/2003   CIN 2-3   Basal cell carcinoma of back 07/22/2021   GERD (gastroesophageal reflux disease)    Hyperlipidemia    Migraine with aura     No Known Allergies   Review of Systems:  No headache, visual changes, nausea, vomiting, diarrhea, constipation, dizziness, abdominal pain, skin rash, fevers, chills, night sweats, weight loss, swollen lymph nodes, body aches, joint swelling, chest pain, shortness of breath, mood changes. POSITIVE muscle aches  Objective  Blood pressure 124/84, pulse 73, height '5\' 8"'$  (1.727 m), weight 225 lb (102.1 kg), SpO2 99 %.   General: No apparent distress alert and oriented x3 mood and affect normal, dressed appropriately.  HEENT: Pupils equal, extraocular movements intact  Respiratory: Patient's speak in full sentences and does not appear short of breath  Cardiovascular: No  lower extremity edema, non tender, no erythema compression sock on the left leg Gait normal  Back -back exam does show the patient does have some mild loss of lordosis.  Still has some tightness noted in the paraspinal musculature and seems to be tender over the right sacroiliac joint.  Osteopathic findings  C2 flexed rotated and side bent right C6 flexed rotated and side bent left T3 extended rotated and side bent right inhaled rib T8 extended rotated and side bent left L3 flexed rotated and side bent right Sacrum right on right     Assessment and Plan:  Traumatic hematoma of lower leg, left, initial encounter Continues to improve at this time.  Patient skin is not as taut.  All bruising has completely resolved at this time.  Patient is still wearing the compression stocking encouraged Return was completely done.  Follow-up with me again in 6 to 8 weeks  Low back pain Chronic problem but stable overall.  Does have muscle relaxer for any type of worsening pain.  Will be traveling.  Discussed posture and ergonomics.  Follow-up with me again in 6 to 8 weeks    Nonallopathic problems  Decision today to treat with OMT was based on Physical Exam  After verbal consent patient was treated with HVLA, ME, FPR techniques in cervical, rib, thoracic, lumbar, and sacral  areas  Patient tolerated the procedure well with improvement in symptoms  Patient given exercises, stretches and lifestyle modifications  See medications in patient instructions if given  Patient will follow up in 4-8 weeks  The above documentation has been reviewed and is accurate and complete Lyndal Pulley, DO        Note: This dictation was prepared with Dragon dictation along with smaller phrase technology. Any transcriptional errors that result from this process are unintentional.

## 2022-02-16 ENCOUNTER — Ambulatory Visit (INDEPENDENT_AMBULATORY_CARE_PROVIDER_SITE_OTHER): Payer: BC Managed Care – PPO | Admitting: Family Medicine

## 2022-02-16 VITALS — BP 124/84 | HR 73 | Ht 68.0 in | Wt 225.0 lb

## 2022-02-16 DIAGNOSIS — S8012XA Contusion of left lower leg, initial encounter: Secondary | ICD-10-CM | POA: Diagnosis not present

## 2022-02-16 DIAGNOSIS — M9904 Segmental and somatic dysfunction of sacral region: Secondary | ICD-10-CM

## 2022-02-16 DIAGNOSIS — M9908 Segmental and somatic dysfunction of rib cage: Secondary | ICD-10-CM

## 2022-02-16 DIAGNOSIS — M9903 Segmental and somatic dysfunction of lumbar region: Secondary | ICD-10-CM

## 2022-02-16 DIAGNOSIS — M545 Low back pain, unspecified: Secondary | ICD-10-CM | POA: Diagnosis not present

## 2022-02-16 DIAGNOSIS — M9901 Segmental and somatic dysfunction of cervical region: Secondary | ICD-10-CM | POA: Diagnosis not present

## 2022-02-16 DIAGNOSIS — G8929 Other chronic pain: Secondary | ICD-10-CM

## 2022-02-16 DIAGNOSIS — M9902 Segmental and somatic dysfunction of thoracic region: Secondary | ICD-10-CM

## 2022-02-16 NOTE — Assessment & Plan Note (Signed)
Chronic problem but stable overall.  Does have muscle relaxer for any type of worsening pain.  Will be traveling.  Discussed posture and ergonomics.  Follow-up with me again in 6 to 8 weeks

## 2022-02-16 NOTE — Assessment & Plan Note (Signed)
Continues to improve at this time.  Patient skin is not as taut.  All bruising has completely resolved at this time.  Patient is still wearing the compression stocking encouraged Return was completely done.  Follow-up with me again in 6 to 8 weeks

## 2022-02-16 NOTE — Patient Instructions (Signed)
Continue compression sock See me in 2 months

## 2022-04-15 ENCOUNTER — Other Ambulatory Visit: Payer: Self-pay | Admitting: Physician Assistant

## 2022-04-19 ENCOUNTER — Encounter: Payer: Self-pay | Admitting: Physician Assistant

## 2022-04-19 ENCOUNTER — Ambulatory Visit (INDEPENDENT_AMBULATORY_CARE_PROVIDER_SITE_OTHER): Payer: BC Managed Care – PPO | Admitting: Physician Assistant

## 2022-04-19 VITALS — BP 120/80 | HR 74 | Temp 97.6°F | Ht 68.0 in | Wt 222.4 lb

## 2022-04-19 DIAGNOSIS — R079 Chest pain, unspecified: Secondary | ICD-10-CM

## 2022-04-19 DIAGNOSIS — F418 Other specified anxiety disorders: Secondary | ICD-10-CM

## 2022-04-19 MED ORDER — LORAZEPAM 0.5 MG PO TABS: 0.5000 mg | ORAL_TABLET | Freq: Two times a day (BID) | ORAL | 0 refills | Status: DC | PRN

## 2022-04-19 NOTE — Progress Notes (Unsigned)
Susan [name] [place] [address] [place] Phone: [phone] Subjective:   IVilma [name], am serving as a scribe for Dr. Hulan Saas.  I'm seeing this patient by the request  of:  Susan [name], [place]  CC: Back and neck pain follow-up  [id number]  Susan [name] is a 46 y.o. female coming in with complaint of back and neck pain. OMT on 02/16/2022. Patient states doing well. Has some chest pain that has changed in nature since Tuesday. Location of pain on left side of chest, feels like poke. No other issues.  Medications patient has been prescribed: Zanaflex  Taking:         Reviewed prior external information including notes and imaging from previsou exam, outside providers and external EMR if available.  Reviewed patient's primary care note for the chest discomfort.  We reviewed patient's EKG and x-rays that were unremarkable.  As well as notes that were available from care everywhere and other healthcare systems.  Past medical history, social, surgical and family history all reviewed in electronic medical record.  No pertanent information unless stated regarding to the chief complaint.   Past Medical History:  Diagnosis Date   Abnormal pap 02/2003   CIN 2-3   Basal cell carcinoma of back 07/22/2021   GERD (gastroesophageal reflux disease)    Hyperlipidemia    Migraine with aura     No Known Allergies   Review of Systems:  No headache, visual changes, nausea, vomiting, diarrhea, constipation, dizziness, abdominal pain, skin rash, fevers, chills, night sweats, weight loss, swollen lymph nodes joint swelling, chest pain, shortness of breath, mood changes. POSITIVE muscle aches, body aches  Objective  Blood pressure 118/80, pulse 70, height '5\' 8"'$  (1.727 m), weight 223 lb (101.2 kg), last menstrual period 03/21/2022, SpO2 98 %.   General: No apparent distress alert and oriented x3 mood and affect normal, dressed  appropriately.  HEENT: Pupils equal, extraocular movements intact  Respiratory: Patient's speak in full sentences and does not appear short of breath  Cardiovascular: No lower extremity edema, non tender, no erythema  Gait MSK:  Back does have some loss of lordosis.  Patient has more tenderness noted in the parascapular region on the left side.  No true though increase in tenderness of the chest wall.  Patient does state that there is some pleuritic changes with flexing over initially.  Patient is able to take a deep breath but states it is uncomfortable  Osteopathic findings  C2 flexed rotated and side bent right C7 flexed rotated and side bent left T7 extended rotated and side bent left inhaled rib T9 extended rotated and side bent left L2 flexed rotated and side bent right L4 flexed rotated and side bent left Sacrum right on right       Assessment and Plan:  Low back pain Continue low back pain.  Mild exacerbation.  Did have more scapular discomfort as well noted.  Discussed which activities to do and which ones to avoid.  Increase activity slowly.  Discussed core strengthening.  Follow-up again in 6 to 8 weeks  Chest wall pain Patient has some signs and symptoms that is consistent with chest wall pain but there is a possibility of unfortunately pleuritic changes.  Patient did have an EKG and chest x-rays that were unremarkable.  We will get a D-dimer just to make sure normal.  If normal likely all musculoskeletal and hopefully will resolve soon.    Nonallopathic problems  Decision today to treat with OMT was based on Physical Exam  After verbal consent patient was treated with HVLA, ME, FPR techniques in cervical, rib, thoracic, lumbar, and sacral  areas  Patient tolerated the procedure well with improvement in symptoms  Patient given exercises, stretches and lifestyle modifications  See medications in patient instructions if given  Patient will follow up in 4-8  weeks    The above documentation has been reviewed and is accurate and complete Lyndal Pulley, DO          Note: This dictation was prepared with Dragon dictation along with smaller phrase technology. Any transcriptional errors that result from this process are unintentional.

## 2022-04-19 NOTE — Progress Notes (Signed)
Susan [name] is a 46 y.o. female here for a new problem of breast pain.   History of Present Illness:   Chief Complaint  Patient presents with   Breast Pain    Pt c/o left breast pain started yesterday off and on sharp pains. Denies discharge from nipple.    HPI  Left Breast Pain  Patient presents with c/o of left breast pain on and off since yesterday. She described this as "sharp/stabbing pain". States pain started suddenly yesterday. She just got home from [employer] when she felt a "stabbing pain". States she has been experiencing "dull achey" pain when taking deep breath.  Per pt, she was watering her plants last night and bend down to pick green bean and felt similar stabbing pain. She was able to go to sleep without any difficulty. Reports she woke up this morning and was doing well. However, her symptoms started to recur. No specific treatment tried. Last mammogram was completed on October 2022 which was normal.   Had similar issue about 3 years ago. She was seen in the ED for this at that time. States she was having left sided chest/breast pain. Pain was radiating up to her arm and down her chest. At that time, had blood work which was normal. Had EKG and CXR which was normal as well. Has had not pain until today since then. Denies discharge from nipples. Denies any pain in right breast, .  No shortness of breath. Denies weakness.   Anxiety Patient complain of increased anxiety that has been onset for a while. States she had worsening anxiety while traveling. Does not know if this related to being in the airplane or some other issue. Per pt, she takes several days to adjust when traveling. Has had decreased mood and appetite. She has never been on medication for this. At this time, she is willing to trial medication for her symptoms. No SI/HI.   Past Medical History:  Diagnosis Date   Abnormal pap 02/2003   CIN 2-3   Basal cell carcinoma of back 07/22/2021   GERD  (gastroesophageal reflux disease)    Hyperlipidemia    Migraine with aura      Social History   Tobacco Use   Smoking status: Never   Smokeless tobacco: Never  Vaping Use   Vaping Use: Never used  Substance Use Topics   Alcohol use: Yes    Alcohol/week: 2.0 standard drinks of alcohol    Types: 2 Glasses of wine per week    Comment: 1-2 glasses/week   Drug use: No    Past Surgical History:  Procedure Laterality Date   CERVICAL BIOPSY  W/ LOOP ELECTRODE EXCISION  02/2003   CIN 2-3   CHOLECYSTECTOMY  2015   COSMETIC SURGERY  2009    Family History  Problem Relation Age of Onset   Hypertension Father    Depression Father        Suicide   Hypertension Maternal Grandmother    Diabetes Maternal Grandmother    Stroke Maternal Grandmother    Heart failure Maternal Grandfather    Diabetes Paternal 53    HIV Mother    Other Mother    Early death Mother        Age 72   Depression Paternal Grandfather        Suicide   Early death Brother        Age 59   Drug abuse Brother    Colon cancer Neg Hx  Colon polyps Neg Hx    Esophageal cancer Neg Hx    Rectal cancer Neg Hx    Stomach cancer Neg Hx     No Known Allergies  Current Medications:   Current Outpatient Medications:    desoximetasone (TOPICORT) 0.25 % cream, Use 1 X daily prn. But not long term, Disp: 30 g, Rfl: 2   LORazepam (ATIVAN) 0.5 MG tablet, Take 1 tablet (0.5 mg total) by mouth 2 (two) times daily as needed for anxiety., Disp: 20 tablet, Rfl: 0   Multiple Vitamins-Minerals (MULTIVITAMIN PO), Take by mouth daily., Disp: , Rfl:    pantoprazole (PROTONIX) 40 MG tablet, TAKE 1 TABLET BY MOUTH EVERY DAY, Disp: 90 tablet, Rfl: 1   pravastatin (PRAVACHOL) 40 MG tablet, Take 1 tablet (40 mg total) by mouth daily., Disp: 90 tablet, Rfl: 3   TART CHERRY PO, Take by mouth., Disp: , Rfl:    tiZANidine (ZANAFLEX) 4 MG tablet, TAKE 1 TABLET BY MOUTH EVERYDAY AT BEDTIME, Disp: 90 tablet, Rfl: 0   traZODone  (DESYREL) 100 MG tablet, TAKE 1 TABLET BY MOUTH EVERYDAY AT BEDTIME, Disp: 90 tablet, Rfl: 1   Vitamin D 12.5 MCG/0.25ML LIQD, Take by mouth., Disp: , Rfl:    Review of Systems:   ROS Negative unless otherwise specified per HPI.   Vitals:   Vitals:   04/19/22 1028  BP: 120/80  Pulse: 74  Temp: 97.6 F (36.4 C)  TempSrc: Temporal  SpO2: 98%  Weight: 222 lb 6.1 oz (100.9 kg)  Height: '5\' 8"'$  (1.727 m)     Body mass index is 33.81 kg/m.  Physical Exam:   Physical Exam Vitals and nursing note reviewed. Exam conducted with a chaperone present.  Constitutional:      General: She is not in acute distress.    Appearance: She is well-developed. She is not ill-appearing or toxic-appearing.  Cardiovascular:     Rate and Rhythm: Normal rate and regular rhythm.     Pulses: Normal pulses.     Heart sounds: Normal heart sounds, S1 normal and S2 normal.  Pulmonary:     Effort: Pulmonary effort is normal.     Breath sounds: Normal breath sounds.  Chest:     Comments: No chest wall tenderness No lumps or breast tenderness on my exam Skin:    General: Skin is warm and dry.  Neurological:     Mental Status: She is alert.     GCS: GCS eye subscore is 4. GCS verbal subscore is 5. GCS motor subscore is 6.  Psychiatric:        Speech: Speech normal.        Behavior: Behavior normal. Behavior is cooperative.     Assessment and Plan:   Chest pain, unspecified type EKG tracing is personally reviewed.  EKG notes NSR.  No acute changes.  No exertional component with her symptoms  Suspect MSK related -- trial NSAIDs If lack of improvement, will order CXR Given this is her second episode of this, we will also order CT calcium score and echo for further evaluation If any worsening, recommend ER evaluation  Situational anxiety Uncontrolled for flying Recommend ativan 0.5 mg prn for flying Follow-up prn   I,Savera Zaman,acting as a scribe for [employer], PA.,have documented all  relevant documentation on the behalf of [name], PA,as directed by  [name], PA while in the presence of [name], [place].   I, [name], [place], have reviewed all documentation for this visit.  The documentation on 04/19/22 for the exam, diagnosis, procedures, and orders are all accurate and complete.   [name], PA-C

## 2022-04-19 NOTE — Patient Instructions (Signed)
It was great to see you!  EKG looks overall ok  Check blood work today  Trial oral ibuprofen to see if this can help with your symptoms, if it does not -- let me know and we will obtain xray  If any worsening, please go to the ER.  Take care,  [name] PA-C

## 2022-04-20 ENCOUNTER — Ambulatory Visit (INDEPENDENT_AMBULATORY_CARE_PROVIDER_SITE_OTHER): Payer: BC Managed Care – PPO | Admitting: Family Medicine

## 2022-04-20 VITALS — BP 118/80 | HR 70 | Ht 68.0 in | Wt 223.0 lb

## 2022-04-20 DIAGNOSIS — M545 Low back pain, unspecified: Secondary | ICD-10-CM

## 2022-04-20 DIAGNOSIS — M9903 Segmental and somatic dysfunction of lumbar region: Secondary | ICD-10-CM | POA: Diagnosis not present

## 2022-04-20 DIAGNOSIS — M9901 Segmental and somatic dysfunction of cervical region: Secondary | ICD-10-CM | POA: Diagnosis not present

## 2022-04-20 DIAGNOSIS — M2550 Pain in unspecified joint: Secondary | ICD-10-CM

## 2022-04-20 DIAGNOSIS — R0789 Other chest pain: Secondary | ICD-10-CM

## 2022-04-20 DIAGNOSIS — M9904 Segmental and somatic dysfunction of sacral region: Secondary | ICD-10-CM | POA: Diagnosis not present

## 2022-04-20 DIAGNOSIS — G8929 Other chronic pain: Secondary | ICD-10-CM | POA: Diagnosis not present

## 2022-04-20 DIAGNOSIS — M9902 Segmental and somatic dysfunction of thoracic region: Secondary | ICD-10-CM

## 2022-04-20 DIAGNOSIS — M9908 Segmental and somatic dysfunction of rib cage: Secondary | ICD-10-CM

## 2022-04-20 LAB — COMPREHENSIVE METABOLIC PANEL
ALT: 26 U/L (ref 0–35)
AST: 21 U/L (ref 0–37)
Albumin: 4.6 g/dL (ref 3.5–5.2)
Alkaline Phosphatase: 76 U/L (ref 39–117)
BUN: 11 mg/dL (ref 6–23)
CO2: 27 mEq/L (ref 19–32)
Calcium: 9.2 mg/dL (ref 8.4–10.5)
Chloride: 104 mEq/L (ref 96–112)
Creatinine, Ser: 0.98 mg/dL (ref 0.40–1.20)
GFR: 68.92 mL/min (ref >60.00)
Glucose, Bld: 97 mg/dL (ref 70–99)
Potassium: 3.6 mEq/L (ref 3.5–5.1)
Sodium: 138 mEq/L (ref 135–145)
Total Bilirubin: 0.5 mg/dL (ref 0.2–1.2)
Total Protein: 7.4 g/dL (ref 6.0–8.3)

## 2022-04-20 LAB — CBC WITH DIFFERENTIAL/PLATELET
Basophils Absolute: 0.1 10*3/uL (ref 0.0–0.1)
Basophils Relative: 0.7 % (ref 0.0–3.0)
Eosinophils Absolute: 0.1 10*3/uL (ref 0.0–0.7)
Eosinophils Relative: 1 % (ref 0.0–5.0)
HCT: 37.6 % (ref 36.0–46.0)
Hemoglobin: 12.5 g/dL (ref 12.0–15.0)
Lymphocytes Relative: 31 % (ref 12.0–46.0)
Lymphs Abs: 2.9 10*3/uL (ref 0.7–4.0)
MCHC: 33.2 g/dL (ref 30.0–36.0)
MCV: 90 fl (ref 78.0–100.0)
Monocytes Absolute: 0.5 10*3/uL (ref 0.1–1.0)
Monocytes Relative: 5.4 % (ref 3.0–12.0)
Neutro Abs: 5.7 10*3/uL (ref 1.4–7.7)
Neutrophils Relative %: 61.9 % (ref 43.0–77.0)
Platelets: 303 10*3/uL (ref 150.0–400.0)
RBC: 4.18 Mil/uL (ref 3.87–5.11)
RDW: 13.5 % (ref 11.5–15.5)
WBC: 9.2 10*3/uL (ref 4.0–10.5)

## 2022-04-20 LAB — SEDIMENTATION RATE: Sed Rate: 11 mm/hr (ref 0–20)

## 2022-04-20 NOTE — Assessment & Plan Note (Signed)
Continue low back pain.  Mild exacerbation.  Did have more scapular discomfort as well noted.  Discussed which activities to do and which ones to avoid.  Increase activity slowly.  Discussed core strengthening.  Follow-up again in 6 to 8 weeks

## 2022-04-20 NOTE — Assessment & Plan Note (Signed)
Patient has some signs and symptoms that is consistent with chest wall pain but there is a possibility of unfortunately pleuritic changes.  Patient did have an EKG and chest x-rays that were unremarkable.  We will get a D-dimer just to make sure normal.  If normal likely all musculoskeletal and hopefully will resolve soon.

## 2022-04-20 NOTE — Patient Instructions (Signed)
Labs today Hopefully this feels much much better

## 2022-04-21 LAB — D-DIMER, QUANTITATIVE: D-Dimer, Quant: 0.21 mcg/mL FEU (ref <0.50)

## 2022-05-31 ENCOUNTER — Ambulatory Visit (HOSPITAL_BASED_OUTPATIENT_CLINIC_OR_DEPARTMENT_OTHER)
Admission: RE | Admit: 2022-05-31 | Discharge: 2022-05-31 | Disposition: A | Discharge status: Home / Self Care | Payer: BC Managed Care – PPO | Source: Ambulatory Visit | Attending: Physician Assistant | Admitting: Physician Assistant

## 2022-05-31 ENCOUNTER — Encounter (HOSPITAL_BASED_OUTPATIENT_CLINIC_OR_DEPARTMENT_OTHER): Payer: Self-pay

## 2022-05-31 ENCOUNTER — Encounter: Payer: Self-pay | Admitting: Physician Assistant

## 2022-05-31 DIAGNOSIS — I77810 Thoracic aortic ectasia: Secondary | ICD-10-CM | POA: Insufficient documentation

## 2022-05-31 DIAGNOSIS — R0789 Other chest pain: Secondary | ICD-10-CM

## 2022-05-31 DIAGNOSIS — R079 Chest pain, unspecified: Secondary | ICD-10-CM

## 2022-06-07 NOTE — Progress Notes (Unsigned)
Susan [name] Susan [name] [address] Susan [name] Phone: [phone] Subjective:   IVilma [name], am serving as a scribe for Dr. Hulan Saas.  I'm seeing this patient by the request  of:  [name], [place]  CC: Neck and back pain follow-up  [id number]  Susan [name] is a 46 y.o. female coming in with complaint of back and neck pain. OMT on 04/20/2022. Patient states doing well. Here for manipulation. No new issues.  Medications patient has been prescribed: None  Taking:         Reviewed prior external information including notes and imaging from previsou exam, outside providers and external EMR if available.   As well as notes that were available from care everywhere and other healthcare systems.  Past medical history, social, surgical and family history all reviewed in electronic medical record.  No pertanent information unless stated regarding to the chief complaint.   Past Medical History:  Diagnosis Date   Abnormal pap 02/2003   CIN 2-3   Basal cell carcinoma of back 07/22/2021   GERD (gastroesophageal reflux disease)    Hyperlipidemia    Migraine with aura     No Known Allergies   Review of Systems:  No headache, visual changes, nausea, vomiting, diarrhea, constipation, dizziness, abdominal pain, skin rash, fevers, chills, night sweats, weight loss, swollen lymph nodes, body aches, joint swelling, chest pain, shortness of breath, mood changes. POSITIVE muscle aches  Objective  Blood pressure 116/82, pulse (!) 58, height '5\' 8"'$  (1.727 m), weight 221 lb (100.2 kg), SpO2 98 %.   General: No apparent distress alert and oriented x3 mood and affect normal, dressed appropriately.  HEENT: Pupils equal, extraocular movements intact  Respiratory: Patient's speak in full sentences and does not appear short of breath  Cardiovascular: No lower extremity edema, non tender, no erythema  Neck exam does have some loss of lordosis.   Patient does have some tightness noted.  Patient does have tightness noted in the parascapular region right greater than left.  Patient does have tightness in the low back as well more in the thoracolumbar junction.  Osteopathic findings  C2 flexed rotated and side bent right C6 flexed rotated and side bent left T3 extended rotated and side bent right inhaled rib T9 extended rotated and side bent left L2 flexed rotated and side bent right Sacrum right on right       Assessment and Plan:  Low back pain Low back exam does have some loss of lordosis still noted.  Patient now has responded extremely well though to osteopathic manipulation.  Has Zanaflex for any type of breakthrough pain.  Discussed continuing to monitor other difficulties she is having such as a chest wall pain and being thoracic aneurysm that is being monitored.  Follow-up with me again in 6 to 8 weeks    Nonallopathic problems  Decision today to treat with OMT was based on Physical Exam  After verbal consent patient was treated with HVLA, ME, FPR techniques in cervical, rib, thoracic, lumbar, and sacral  areas  Patient tolerated the procedure well with improvement in symptoms  Patient given exercises, stretches and lifestyle modifications  See medications in patient instructions if given  Patient will follow up in 4-8 weeks    The above documentation has been reviewed and is accurate and complete Lyndal Pulley, DO          Note: This dictation was prepared with Dragon dictation along with smaller  Company secretary. Any transcriptional errors that result from this process are unintentional.

## 2022-06-08 ENCOUNTER — Ambulatory Visit (INDEPENDENT_AMBULATORY_CARE_PROVIDER_SITE_OTHER): Payer: BC Managed Care – PPO | Admitting: Family Medicine

## 2022-06-08 VITALS — BP 116/82 | HR 58 | Ht 68.0 in | Wt 221.0 lb

## 2022-06-08 DIAGNOSIS — M545 Low back pain, unspecified: Secondary | ICD-10-CM

## 2022-06-08 DIAGNOSIS — M9908 Segmental and somatic dysfunction of rib cage: Secondary | ICD-10-CM

## 2022-06-08 DIAGNOSIS — G8929 Other chronic pain: Secondary | ICD-10-CM | POA: Diagnosis not present

## 2022-06-08 DIAGNOSIS — M9904 Segmental and somatic dysfunction of sacral region: Secondary | ICD-10-CM | POA: Diagnosis not present

## 2022-06-08 DIAGNOSIS — M9901 Segmental and somatic dysfunction of cervical region: Secondary | ICD-10-CM | POA: Diagnosis not present

## 2022-06-08 DIAGNOSIS — M9903 Segmental and somatic dysfunction of lumbar region: Secondary | ICD-10-CM

## 2022-06-08 DIAGNOSIS — M9902 Segmental and somatic dysfunction of thoracic region: Secondary | ICD-10-CM
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
## 2022-06-08 NOTE — Patient Instructions (Signed)
Good to see you! I'm going to read more and anything else to add I'll write you

## 2022-06-08 NOTE — Assessment & Plan Note (Signed)
Low back exam does have some loss of lordosis still noted.  Patient now has responded extremely well though to osteopathic manipulation.  Has Zanaflex for any type of breakthrough pain.  Discussed continuing to monitor other difficulties she is having such as a chest wall pain and being thoracic aneurysm that is being monitored.  Follow-up with me again in 6 to 8 weeks

## 2022-06-21 NOTE — Progress Notes (Unsigned)
Cardiology Office Note   Date:  06/22/2022   ID:  Susan [name], DOB Dec 21, 1975, MRN [id number]  PCP:  Jarold Motto, PA  Cardiologist:   None Referring:  Jarold Motto, PA  Chief Complaint  Patient presents with   Aortic Enlargement      History of Present Illness: Susan [name] is a 46 y.o. female who is referred by Jarold Motto, PA for evaluation of a sending aortic dilatation.  She had 41 mm aorta noted on a recent coronary calcium score.  A coronary calcium score was 0.    She has no past cardiac history.  She was noted to have the enlarged aorta on the calcium score.  She had this in response to chest pain.  This has been fleeting discomfort.  It happens very sporadically and has not been happening recently.  It is sharp and shooting mild.  There are no associated symptoms such as nausea vomiting or diaphoresis.  He does not have radiation to the jaw or to the arms.  She could not bring it on recently with climbing 400 stairs in her church.  She is able to do her activities of daily living without any symptoms.  She denies any shortness of breath, PND or orthopnea.  She had no weight gain or edema.    Past Medical History:  Diagnosis Date   Abnormal pap 02/2003   CIN 2-3   Basal cell carcinoma of back 07/22/2021   GERD (gastroesophageal reflux disease)    Hyperlipidemia    Migraine with aura     Past Surgical History:  Procedure Laterality Date   CERVICAL BIOPSY  W/ LOOP ELECTRODE EXCISION  02/2003   CIN 2-3   CHOLECYSTECTOMY  2015   COSMETIC SURGERY  2009     Current Outpatient Medications  Medication Sig Dispense Refill   desoximetasone (TOPICORT) 0.25 % cream Use 1 X daily prn. But not long term 30 g 2   Multiple Vitamins-Minerals (MULTIVITAMIN PO) Take by mouth daily.     pantoprazole (PROTONIX) 40 MG tablet TAKE 1 TABLET BY MOUTH EVERY DAY 90 tablet 1   pravastatin (PRAVACHOL) 40 MG tablet Take 1 tablet (40 mg total) by mouth daily.  90 tablet 3   TART CHERRY PO Take by mouth.     tiZANidine (ZANAFLEX) 4 MG tablet TAKE 1 TABLET BY MOUTH EVERYDAY AT BEDTIME 90 tablet 0   traZODone (DESYREL) 100 MG tablet TAKE 1 TABLET BY MOUTH EVERYDAY AT BEDTIME 90 tablet 1   Vitamin D 12.5 MCG/0.25ML LIQD Take by mouth.     LORazepam (ATIVAN) 0.5 MG tablet Take 1 tablet (0.5 mg total) by mouth 2 (two) times daily as needed for anxiety. (Patient not taking: Reported on 06/22/2022) 20 tablet 0   No current facility-administered medications for this visit.    Allergies:   Patient has no known allergies.    Social History:  The patient  reports that she has never smoked. She has never used smokeless tobacco. She reports current alcohol use of about 2.0 standard drinks of alcohol per week. She reports that she does not use drugs.   Family History:  The patient's family history includes Depression in her father and paternal grandfather; Diabetes in her maternal grandmother and paternal grandmother; Drug abuse in her brother; Early death in her brother and mother; HIV in her mother; Heart failure in her maternal grandfather; Hypertension in her father and maternal grandmother; Other in her mother; Stroke in her maternal  grandmother.    ROS:  Please see the history of present illness.   Otherwise, review of systems are positive for none.   All other systems are reviewed and negative.    PHYSICAL EXAM: VS:  BP 120/88   Pulse 68   Ht 5\' 8"  (1.727 m)   Wt 220 lb 12.8 oz (100.2 kg)   SpO2 98%   BMI 33.57 kg/m  , BMI Body mass index is 33.57 kg/m. GENERAL:  Well appearing HEENT:  Pupils equal round and reactive, fundi not visualized, oral mucosa unremarkable NECK:  No jugular venous distention, waveform within normal limits, carotid upstroke brisk and symmetric, no bruits, no thyromegaly LYMPHATICS:  No cervical, inguinal adenopathy LUNGS:  Clear to auscultation bilaterally BACK:  No CVA tenderness CHEST:  Unremarkable HEART:  PMI not  displaced or sustained,S1 and S2 within normal limits, no S3, no S4, probable opening snap, no rubs, possible soft apical nonradiating systolic murmurs ABD:  Flat, positive bowel sounds normal in frequency in pitch, no bruits, no rebound, no guarding, no midline pulsatile mass, no hepatomegaly, no splenomegaly EXT:  2 plus pulses throughout, no edema, no cyanosis no clubbing SKIN:  No rashes no nodules NEURO:  Cranial nerves II through XII grossly intact, motor grossly intact throughout PSYCH:  Cognitively intact, oriented to person place and time    EKG:  EKG is ordered today. The ekg ordered 04/19/22 demonstrates sinus rate 63, axis within normal limits, intervals within normal limits, no acute ST-T wave changes.   Recent Labs: 04/20/2022: ALT 26; BUN 11; Creatinine, Ser 0.98; Hemoglobin 12.5; Platelets 303.0; Potassium 3.6; Sodium 138    Lipid Panel    Component Value Date/Time   CHOL 206 (H) 07/22/2021 1415   TRIG 162.0 (H) 07/22/2021 1415   HDL 56.30 07/22/2021 1415   CHOLHDL 4 07/22/2021 1415   VLDL 32.4 07/22/2021 1415   LDLCALC 117 (H) 07/22/2021 1415   LDLCALC 177 (H) 07/21/2020 1116      Wt Readings from Last 3 Encounters:  06/22/22 220 lb 12.8 oz (100.2 kg)  06/08/22 221 lb (100.2 kg)  04/20/22 223 lb (101.2 kg)      Other studies Reviewed: Additional studies/ records that were reviewed today include: CT and labs. Review of the above records demonstrates:  Please see elsewhere in the note.     ASSESSMENT AND PLAN:  Ascending aortic dilatation: I think this is mild and I will follow this up with another CT in 2 years.  She does have an opening snap and questionable slight systolic murmur at the apex so I will get an echocardiogram to rule out a bicuspid aortic valve.  Dyslipidemia: She just had her dose of pravastatin increased.  LDL was 117.  I suggested a goal of at least less than 100.  We talked about diet and exercise.   Current medicines are reviewed at  length with the patient today.  The patient does not have concerns regarding medicines.  The following changes have been made:  no change  Labs/ tests ordered today include:   Orders Placed This Encounter  Procedures   ECHOCARDIOGRAM COMPLETE     Disposition:   FU with me in 2 years   Signed, Rollene Rotunda, MD  06/22/2022 8:40 AM    [facility]

## 2022-06-22 ENCOUNTER — Encounter: Payer: Self-pay | Admitting: Cardiology

## 2022-06-22 ENCOUNTER — Ambulatory Visit: Payer: BC Managed Care – PPO | Attending: Cardiology | Admitting: Cardiology

## 2022-06-22 VITALS — BP 120/88 | HR 68 | Ht 68.0 in | Wt 220.8 lb

## 2022-06-22 DIAGNOSIS — R011 Cardiac murmur, unspecified: Secondary | ICD-10-CM | POA: Diagnosis not present

## 2022-06-22 DIAGNOSIS — I7121 Aneurysm of the ascending aorta, without rupture: Secondary | ICD-10-CM | POA: Diagnosis not present

## 2022-06-22 NOTE — Patient Instructions (Signed)
  Testing/Procedures:  Your physician has requested that you have an echocardiogram. Echocardiography is a painless test that uses sound waves to create images of your heart. It provides your doctor with information about the size and shape of your heart and how well your heart's chambers and valves are working. This procedure takes approximately one hour. There are no restrictions for this procedure. [place], you and your health needs are our priority.  As part of our continuing mission to provide you with exceptional heart care, we have created designated Provider Care Teams.  These Care Teams include your primary Cardiologist (physician) and Advanced Practice Providers (APPs -  Physician Assistants and Nurse Practitioners) who all work together to provide you with the care you need, when you need it.  We recommend signing up for the patient portal called "MyChart".  Sign up information is provided on this After Visit Summary.  MyChart is used to connect with patients for Virtual Visits (Telemedicine).  Patients are able to view lab/test results, encounter notes, upcoming appointments, etc.  Non-urgent messages can be sent to your provider as well.   To learn more about what you can do with MyChart, go to [email or web address].    Your next appointment:   2 year(s)  The format for your next appointment:   In Person  Provider:   Minus Breeding MD

## 2022-07-05 ENCOUNTER — Ambulatory Visit (HOSPITAL_COMMUNITY): Payer: BC Managed Care – PPO | Attending: Internal Medicine

## 2022-07-05 DIAGNOSIS — R011 Cardiac murmur, unspecified: Secondary | ICD-10-CM | POA: Diagnosis not present

## 2022-07-05 LAB — ECHOCARDIOGRAM COMPLETE
AR max vel: 2.12 cm2
AV Area VTI: 2 cm2
AV Area mean vel: 2.07 cm2
AV Mean grad: 10 mmHg
AV Peak grad: 18.9 mmHg
Ao pk vel: 2.18 m/s
Area-P 1/2: 3.01 cm2
S' Lateral: 2.9 cm

## 2022-07-14 ENCOUNTER — Ambulatory Visit (HOSPITAL_BASED_OUTPATIENT_CLINIC_OR_DEPARTMENT_OTHER)
Admission: RE | Admit: 2022-07-14 | Discharge: 2022-07-14 | Disposition: A | Discharge status: Home / Self Care | Payer: BC Managed Care – PPO | Source: Ambulatory Visit | Attending: Physician Assistant | Admitting: Physician Assistant

## 2022-07-14 DIAGNOSIS — Z1231 Encounter for screening mammogram for malignant neoplasm of breast: Secondary | ICD-10-CM | POA: Insufficient documentation

## 2022-07-17 ENCOUNTER — Encounter: Payer: Self-pay | Admitting: *Deleted

## 2022-07-17 ENCOUNTER — Other Ambulatory Visit: Payer: Self-pay | Admitting: Physician Assistant

## 2022-07-19 ENCOUNTER — Other Ambulatory Visit: Payer: Self-pay | Admitting: Physician Assistant

## 2022-07-19 DIAGNOSIS — R928 Other abnormal and inconclusive findings on diagnostic imaging of breast: Secondary | ICD-10-CM

## 2022-07-25 ENCOUNTER — Other Ambulatory Visit: Payer: Self-pay | Admitting: Physician Assistant

## 2022-07-25 ENCOUNTER — Encounter: Payer: BC Managed Care – PPO | Admitting: Physician Assistant

## 2022-07-25 ENCOUNTER — Encounter: Payer: Self-pay | Admitting: Physician Assistant

## 2022-07-25 ENCOUNTER — Ambulatory Visit (INDEPENDENT_AMBULATORY_CARE_PROVIDER_SITE_OTHER): Payer: BC Managed Care – PPO | Admitting: Physician Assistant

## 2022-07-25 VITALS — BP 110/74 | HR 73 | Temp 97.8°F | Ht 68.0 in | Wt 223.2 lb

## 2022-07-25 DIAGNOSIS — G4700 Insomnia, unspecified: Secondary | ICD-10-CM | POA: Diagnosis not present

## 2022-07-25 DIAGNOSIS — M722 Plantar fascial fibromatosis: Secondary | ICD-10-CM

## 2022-07-25 DIAGNOSIS — E7800 Pure hypercholesterolemia, unspecified: Secondary | ICD-10-CM | POA: Diagnosis not present

## 2022-07-25 DIAGNOSIS — Z0001 Encounter for general adult medical examination with abnormal findings: Secondary | ICD-10-CM | POA: Diagnosis not present

## 2022-07-25 DIAGNOSIS — E669 Obesity, unspecified: Secondary | ICD-10-CM | POA: Diagnosis not present

## 2022-07-25 DIAGNOSIS — K2100 Gastro-esophageal reflux disease with esophagitis, without bleeding: Secondary | ICD-10-CM

## 2022-07-25 LAB — COMPREHENSIVE METABOLIC PANEL
ALT: 35 U/L (ref 0–35)
AST: 27 U/L (ref 0–37)
Albumin: 4.5 g/dL (ref 3.5–5.2)
Alkaline Phosphatase: 81 U/L (ref 39–117)
BUN: 12 mg/dL (ref 6–23)
CO2: 27 mEq/L (ref 19–32)
Calcium: 9.4 mg/dL (ref 8.4–10.5)
Chloride: 102 mEq/L (ref 96–112)
Creatinine, Ser: 0.94 mg/dL (ref 0.40–1.20)
GFR: 72.32 mL/min (ref >60.00)
Glucose, Bld: 97 mg/dL (ref 70–99)
Potassium: 3.8 mEq/L (ref 3.5–5.1)
Sodium: 136 mEq/L (ref 135–145)
Total Bilirubin: 0.5 mg/dL (ref 0.2–1.2)
Total Protein: 6.8 g/dL (ref 6.0–8.3)

## 2022-07-25 LAB — CBC WITH DIFFERENTIAL/PLATELET
Basophils Absolute: 0.1 10*3/uL (ref 0.0–0.1)
Basophils Relative: 0.7 % (ref 0.0–3.0)
Eosinophils Absolute: 0 10*3/uL (ref 0.0–0.7)
Eosinophils Relative: 0.5 % (ref 0.0–5.0)
HCT: 36.9 % (ref 36.0–46.0)
Hemoglobin: 12.5 g/dL (ref 12.0–15.0)
Lymphocytes Relative: 28 % (ref 12.0–46.0)
Lymphs Abs: 2.4 10*3/uL (ref 0.7–4.0)
MCHC: 33.7 g/dL (ref 30.0–36.0)
MCV: 89.2 fl (ref 78.0–100.0)
Monocytes Absolute: 0.4 10*3/uL (ref 0.1–1.0)
Monocytes Relative: 5 % (ref 3.0–12.0)
Neutro Abs: 5.6 10*3/uL (ref 1.4–7.7)
Neutrophils Relative %: 65.8 % (ref 43.0–77.0)
Platelets: 339 10*3/uL (ref 150.0–400.0)
RBC: 4.14 Mil/uL (ref 3.87–5.11)
RDW: 13.9 % (ref 11.5–15.5)
WBC: 8.5 10*3/uL (ref 4.0–10.5)

## 2022-07-25 LAB — LIPID PANEL
Cholesterol: 196 mg/dL (ref 0–200)
HDL: 57.5 mg/dL (ref >39.00)
NonHDL: 138.98
Total CHOL/HDL Ratio: 3
Triglycerides: 223 mg/dL — ABNORMAL HIGH (ref 0.0–149.0)
VLDL: 44.6 mg/dL — ABNORMAL HIGH (ref 0.0–40.0)

## 2022-07-25 LAB — LDL CHOLESTEROL, DIRECT: Direct LDL: 121 mg/dL

## 2022-07-25 MED ORDER — PHENTERMINE HCL 37.5 MG PO CAPS: 37.5000 mg | ORAL_CAPSULE | ORAL | 2 refills | Status: DC

## 2022-07-25 NOTE — Progress Notes (Signed)
Subjective:    Susan [name] is a 46 y.o. female and is here for a comprehensive physical exam.  HPI  There are no preventive care reminders to display for this patient.  Acute Concerns: Weight gain: She reports she has gained weight this year. She is interested in starting weight loss medication.  Wt Readings from Last 3 Encounters:  07/25/22 223 lb 4 oz (101.3 kg)  06/22/22 220 lb 12.8 oz (100.2 kg)  06/08/22 221 lb (100.2 kg)    Acid Reflux: She notes she had some increased acid reflux about a month ago worsened with increased pravastatin 40 mg daily, but it has resolved.  She has continued protonix 40 mg daily.  Plantar fasciitis: She reports that she has had issues in the past with this. She was recently cleared to start walking again and is now having significant pain to her L foot.   Chronic Issues: Insomnia:   She is currently taking Trazodone 100 mg without any complication to manage her anxiety.   Health Maintenance: Social History--  She denies any changes in alcohol intake.  Colonoscopy -- Last completed on 10/13/2020. Mammogram -- Last completed on 07/14/2022. Results: In the left breast, a possible asymmetry warrants further evaluation. In the right breast, no findings suspicious for malignancy.   PAP -- Last completed on 07/21/2020. Diet -- She notes she is maintaining a healthy diet.   Mood -- She notes her mood has been stable.  UTD with dentist? - She is UTD with dentist.  UTD with eye doctor? - she is not UTD with eye doctor.  Weight history: Wt Readings from Last 10 Encounters:  07/25/22 223 lb 4 oz (101.3 kg)  06/22/22 220 lb 12.8 oz (100.2 kg)  06/08/22 221 lb (100.2 kg)  04/20/22 223 lb (101.2 kg)  04/19/22 222 lb 6.1 oz (100.9 kg)  02/16/22 225 lb (102.1 kg)  01/05/22 220 lb (99.8 kg)  11/17/21 218 lb (98.9 kg)  11/03/21 226 lb (102.5 kg)  10/24/21 226 lb (102.5 kg)   Body mass index is 33.95 kg/m. Patient's last menstrual period  was 07/07/2022 (exact date).  Alcohol use:  reports current alcohol use of about 2.0 standard drinks of alcohol per week.  Tobacco use:  Tobacco Use: Low Risk  (07/25/2022)   Patient History    Smoking Tobacco Use: Never    Smokeless Tobacco Use: Never    Passive Exposure: Not on file   Eligible for lung cancer screening? No     07/22/2021    1:26 PM  Depression screen PHQ 2/9  Decreased Interest 0  Down, Depressed, Hopeless 0  PHQ - 2 Score 0     Other providers/specialists: Patient Care Team: [name], [place] as PCP - General (Physician Assistant)    PMHx, SurgHx, SocialHx, Medications, and Allergies were reviewed in the Visit Navigator and updated as appropriate.   Past Medical History:  Diagnosis Date   Abnormal pap 02/2003   CIN 2-3   Basal cell carcinoma of back 07/22/2021   GERD (gastroesophageal reflux disease)    Hyperlipidemia    Migraine with aura      Past Surgical History:  Procedure Laterality Date   CERVICAL BIOPSY  W/ LOOP ELECTRODE EXCISION  02/2003   CIN 2-3   CHOLECYSTECTOMY  2015   COSMETIC SURGERY  2009     Family History  Problem Relation Age of Onset   HIV Mother    Other Mother    Early death Mother  Age 66   Hypertension Father    Depression Father        Suicide   Early death Brother        Age 26   Drug abuse Brother    Hypertension Maternal Grandmother    Diabetes Maternal Grandmother    Stroke Maternal Grandmother    Heart failure Maternal Grandfather    Heart disease Maternal Grandfather    Diabetes Paternal Grandmother    Depression Paternal Grandfather        Suicide   Colon cancer Neg Hx    Colon polyps Neg Hx    Esophageal cancer Neg Hx    Rectal cancer Neg Hx    Stomach cancer Neg Hx     Social History   Tobacco Use   Smoking status: Never   Smokeless tobacco: Never  Vaping Use   Vaping Use: Never used  Substance Use Topics   Alcohol use: Yes    Alcohol/week: 2.0 standard drinks of alcohol     Types: 2 Glasses of wine per week    Comment: 1-2 glasses/week   Drug use: No    Review of Systems:   Review of Systems  Constitutional:  Negative for chills, fever, malaise/fatigue and weight loss.  HENT:  Negative for hearing loss, sinus pain and sore throat.   Respiratory:  Negative for cough and hemoptysis.   Cardiovascular:  Negative for chest pain, palpitations, orthopnea, leg swelling and PND.  Gastrointestinal:  Negative for abdominal pain, constipation, diarrhea, heartburn, nausea and vomiting.  Genitourinary:  Negative for dysuria, frequency and urgency.  Musculoskeletal:  Negative for back pain, myalgias and neck pain.  Skin:  Negative for itching and rash.  Endo/Heme/Allergies:  Negative for polydipsia.  Psychiatric/Behavioral:  Negative for depression. The patient is not nervous/anxious.     Objective:   BP 110/74 (BP Location: Left Arm, Patient Position: Sitting, Cuff Size: Large)   Pulse 73   Temp 97.8 F (36.6 C) (Temporal)   Ht '5\' 8"'$  (1.727 m)   Wt 223 lb 4 oz (101.3 kg)   LMP 07/07/2022 (Exact Date)   SpO2 98%   BMI 33.95 kg/m  Body mass index is 33.95 kg/m.   General Appearance:    Alert, cooperative, no distress, appears stated age  Head:    Normocephalic, without obvious abnormality, atraumatic  Eyes:    PERRL, conjunctiva/corneas clear, EOM's intact, fundi    benign, both eyes  Ears:    Normal TM's and external ear canals, both ears  Nose:   Nares normal, septum midline, mucosa normal, no drainage    or sinus tenderness  Throat:   Lips, mucosa, and tongue normal; teeth and gums normal  Neck:   Supple, symmetrical, trachea midline, no adenopathy;    thyroid:  no enlargement/tenderness/nodules; no carotid   bruit or JVD  Back:     Symmetric, no curvature, ROM normal, no CVA tenderness  Lungs:     Clear to auscultation bilaterally, respirations unlabored  Chest Wall:    No tenderness or deformity   Heart:    Regular rate and rhythm, S1 and S2  normal, no murmur, rub or gallop  Breast Exam:    Deferred  Abdomen:     Soft, non-tender, bowel sounds active all four quadrants,    no masses, no organomegaly  Genitalia:    Deferred  Extremities:   Extremities normal, atraumatic, no cyanosis or edema  Pulses:   2+ and symmetric all extremities  Skin:  Skin color, texture, turgor normal, no rashes or lesions  Lymph nodes:   Cervical, supraclavicular, and axillary nodes normal  Neurologic:   CNII-XII intact, normal strength, sensation and reflexes    throughout    Assessment/Plan:   Encounter for general adult medical examination with abnormal findings Today patient counseled on age appropriate routine health concerns for screening and prevention, each reviewed and up to date or declined. Immunizations reviewed and up to date or declined. Labs ordered and reviewed. Risk factors for depression reviewed and negative. Hearing function and visual acuity are intact. ADLs screened and addressed as needed. Functional ability and level of safety reviewed and appropriate. Education, counseling and referrals performed based on assessed risks today. Patient provided with a copy of personalized plan for preventive services.  Gastroesophageal reflux disease with esophagitis without hemorrhage Well controlled Continue protonix 40 mg daily Follow-up if new/worsening  Insomnia, unspecified type Well controlled Continue trazodone 100 mg nightly Follow-up if new/worsening  Obesity, unspecified classification, unspecified obesity type, unspecified whether serious comorbidity present Work on trying to become more active Start phentermine, has tolerated well in the past, follow-u pin 3 months If any concerning side effects, she was instructed to stop this  Hypercholesterolemia Update lipid panel and adjust pravastatin 40 mg as indicated  Plantar fasciitis Handout with exercises provided  Follow-up with Sports Medicine if needed  I,Param  Shah,acting as a scribe for [employer], PA.,have documented all relevant documentation on the behalf of [name], PA,as directed by  [name], PA while in the presence of [name], [place].  I, [name], [place], have reviewed all documentation for this visit. The documentation on 07/25/22 for the exam, diagnosis, procedures, and orders are all accurate and complete.  [name], PA-C [place]

## 2022-07-25 NOTE — Patient Instructions (Addendum)
It was great to see you!  Review exercises for plantar fasciitis Try the frozen water bottle trick Follow-up with Charlann Boxer if still a problem  Trial phentermine -- if any concerning side effects, let us know and stop medication  Please go to the lab for blood work.   Our office will call you with your results unless you have chosen to receive results via MyChart.  If your blood work is normal we will follow-up each year for physicals and as scheduled for chronic medical problems.  If anything is abnormal we will treat accordingly and get you in for a follow-up.  Take care,  Aldona Bar

## 2022-07-26 ENCOUNTER — Encounter: Payer: Self-pay | Admitting: Physician Assistant

## 2022-07-26 MED ORDER — DESOXIMETASONE 0.25 % EX CREA: TOPICAL_CREAM | CUTANEOUS | 2 refills | Status: DC

## 2022-07-26 MED ORDER — PRAVASTATIN SODIUM 80 MG PO TABS: 80.0000 mg | ORAL_TABLET | Freq: Every day | ORAL | 3 refills | Status: DC

## 2022-07-31 ENCOUNTER — Telehealth: Payer: Self-pay | Admitting: *Deleted

## 2022-07-31 NOTE — Telephone Encounter (Signed)
Received fax from pharmacy need prior authorization for Desoximetasone 0.25 % cream prescribed by you last. Please advise if there is an alternative?

## 2022-08-01 ENCOUNTER — Other Ambulatory Visit: Payer: Self-pay | Admitting: Physician Assistant

## 2022-08-01 ENCOUNTER — Ambulatory Visit
Admission: RE | Admit: 2022-08-01 | Discharge: 2022-08-01 | Disposition: A | Discharge status: Home / Self Care | Payer: BC Managed Care – PPO | Source: Ambulatory Visit | Attending: Physician Assistant | Admitting: Physician Assistant

## 2022-08-01 DIAGNOSIS — N6322 Unspecified lump in the left breast, upper inner quadrant: Secondary | ICD-10-CM | POA: Diagnosis not present

## 2022-08-01 DIAGNOSIS — R92332 Mammographic heterogeneous density, left breast: Secondary | ICD-10-CM | POA: Diagnosis not present

## 2022-08-01 DIAGNOSIS — N6320 Unspecified lump in the left breast, unspecified quadrant: Secondary | ICD-10-CM

## 2022-08-01 DIAGNOSIS — R92322 Mammographic fibroglandular density, left breast: Secondary | ICD-10-CM | POA: Diagnosis not present

## 2022-08-01 DIAGNOSIS — N6324 Unspecified lump in the left breast, lower inner quadrant: Secondary | ICD-10-CM | POA: Diagnosis not present

## 2022-08-01 DIAGNOSIS — R928 Other abnormal and inconclusive findings on diagnostic imaging of breast: Secondary | ICD-10-CM

## 2022-08-02 NOTE — Telephone Encounter (Signed)
Pt called back told her Rx for Desoximetasone cream should come from the Dermatologist per [facility]. Pt verbalized understanding and will contact Dermatology for an appt.

## 2022-08-02 NOTE — Telephone Encounter (Signed)
Left message on voicemail to call office.  

## 2022-08-04 ENCOUNTER — Ambulatory Visit
Admission: RE | Admit: 2022-08-04 | Discharge: 2022-08-04 | Disposition: A | Discharge status: Home / Self Care | Payer: BC Managed Care – PPO | Source: Ambulatory Visit | Attending: Physician Assistant | Admitting: Physician Assistant

## 2022-08-04 DIAGNOSIS — N6325 Unspecified lump in the left breast, overlapping quadrants: Secondary | ICD-10-CM | POA: Diagnosis not present

## 2022-08-04 DIAGNOSIS — N6320 Unspecified lump in the left breast, unspecified quadrant: Secondary | ICD-10-CM

## 2022-08-04 DIAGNOSIS — N6012 Diffuse cystic mastopathy of left breast: Secondary | ICD-10-CM | POA: Diagnosis not present

## 2022-08-04 HISTORY — PX: BREAST BIOPSY: SHX20

## 2022-08-16 NOTE — Progress Notes (Unsigned)
[name] [place] Phone: [phone] Subjective:   Susan [name], am serving as a scribe for Dr. Hulan Saas.  I'm seeing this patient by the request  of:  Susan [name], [place]  CC: Neck and back pain follow-up, new foot pain  [id number]  Susan [name] is a 46 y.o. female coming in with complaint of back and neck pain. OMT 06/08/2022. Patient states back is not bad but feels issues with her foot she is out of alignment.   Patient is having issues with her left foot, patient thinks it could be coming from her leg issues she has seen you for. The pain started in September, but with in the last month having a lot of trouble stepping on her foot in the morning or after sitting for a long time.  Patient saw PCP got exercises, tried the frozen water bottle, inserts for shoes, and special shoes. Patient thinks it could be plantar fasciitis   Medications patient has been prescribed: zanaflex  Taking: Zanaflex          Reviewed prior external information including notes and imaging from previsou exam, outside providers and external EMR if available.   As well as notes that were available from care everywhere and other healthcare systems.  Past medical history, social, surgical and family history all reviewed in electronic medical record.  No pertanent information unless stated regarding to the chief complaint.   Past Medical History:  Diagnosis Date   Abnormal pap 02/2003   CIN 2-3   Basal cell carcinoma of back 07/22/2021   GERD (gastroesophageal reflux disease)    Hyperlipidemia    Migraine with aura     No Known Allergies   Review of Systems:  No headache, visual changes, nausea, vomiting, diarrhea, constipation, dizziness, abdominal pain, skin rash, fevers, chills, night sweats, weight loss, swollen lymph nodes, body aches, joint swelling, chest pain, shortness of breath, mood changes. POSITIVE  muscle aches  Objective  Blood pressure 132/84, pulse 84, height '5\' 8"'$  (1.727 m), weight 215 lb (97.5 kg), last menstrual period 07/07/2022, SpO2 97 %.   General: No apparent distress alert and oriented x3 mood and affect normal, dressed appropriately.  HEENT: Pupils equal, extraocular movements intact  Respiratory: Patient's speak in full sentences and does not appear short of breath  Cardiovascular: No lower extremity edema, non tender, no erythema  Gait MSK:  Back low back does have significant increase in tightness noted.  Does have some limited sidebending of the neck bilaterally which is new as well.  Tightness with FABER test right greater than left.  Right ankle exam shows the patient does have some tenderness noted as well.  Tender to palpation over the medial and lateral calcaneal area on the plantar aspect.  When patient stands does have some mild breakdown of the longitudinal arch.  Osteopathic findings  C6 flexed rotated and side bent left T3 extended rotated and side bent right inhaled rib T9 extended rotated and side bent left L2 flexed rotated and side bent right Sacrum right on right   [id number]; 15 additional minutes spent for Therapeutic exercises as stated in above notes.  This included exercises focusing on stretching, strengthening, with significant focus on eccentric aspects.   Long term goals include an improvement in range of motion, strength, endurance as well as avoiding reinjury. Patient's frequency would include in 1-2 times a day, 3-5 times a week for a duration of 6-12 weeks  Exercises for the foot include:  Stretches to help lengthen the lower leg and plantar fascia areas Theraband exercises for the lower leg and ankle to help strengthen the surrounding area- dorsiflexion, plantarflexion, inversion, eversion Massage rolling on the plantar surface of the foot with a frozen bottle, tennis ball or golf ball Towel or marble pick-ups to strengthen the plantar surface  of the foot Weight bearing exercises to increase balance and overall stability    Proper technique shown and discussed handout in great detail with ATC.  All questions were discussed and answered.      Assessment and Plan:  Plantar fasciitis of left foot Discussed with patient I do believe there is a significant point of fasciitis.  Discussed with patient to avoid being barefoot, home exercises and work with Product/process development scientist today.  Discussed which activities to do and which ones to avoid.  Increase activity slowly.  Discussed avoiding being barefoot.  Follow-up again in 6 to 8 weeks.  Low back pain Exacerbation secondary to patient's ambulation from her foot pain.  Discussed with patient to continue to work on weight loss and core stability.  Discussed which activities to do and which ones to avoid.  Continue to work on hip abductor strengthening.  Follow-up again in 6 to 8 weeks.    Nonallopathic problems  Decision today to treat with OMT was based on Physical Exam  After verbal consent patient was treated with HVLA, ME, FPR techniques in cervical, rib, thoracic, lumbar, and sacral  areas  Patient tolerated the procedure well with improvement in symptoms  Patient given exercises, stretches and lifestyle modifications  See medications in patient instructions if given  Patient will follow up in 4-8 weeks     The above documentation has been reviewed and is accurate and complete Lyndal Pulley, DO         Note: This dictation was prepared with Dragon dictation along with smaller phrase technology. Any transcriptional errors that result from this process are unintentional.

## 2022-08-17 ENCOUNTER — Ambulatory Visit (INDEPENDENT_AMBULATORY_CARE_PROVIDER_SITE_OTHER): Payer: BC Managed Care – PPO | Admitting: Family Medicine

## 2022-08-17 VITALS — BP 132/84 | HR 84 | Ht 68.0 in | Wt 215.0 lb

## 2022-08-17 DIAGNOSIS — M545 Low back pain, unspecified: Secondary | ICD-10-CM

## 2022-08-17 DIAGNOSIS — M9903 Segmental and somatic dysfunction of lumbar region: Secondary | ICD-10-CM

## 2022-08-17 DIAGNOSIS — M9908 Segmental and somatic dysfunction of rib cage: Secondary | ICD-10-CM

## 2022-08-17 DIAGNOSIS — M9901 Segmental and somatic dysfunction of cervical region: Secondary | ICD-10-CM | POA: Diagnosis not present

## 2022-08-17 DIAGNOSIS — M9904 Segmental and somatic dysfunction of sacral region: Secondary | ICD-10-CM

## 2022-08-17 DIAGNOSIS — M722 Plantar fascial fibromatosis: Secondary | ICD-10-CM | POA: Insufficient documentation

## 2022-08-17 DIAGNOSIS — M9902 Segmental and somatic dysfunction of thoracic region: Secondary | ICD-10-CM

## 2022-08-17 DIAGNOSIS — G8929 Other chronic pain: Secondary | ICD-10-CM | POA: Diagnosis not present

## 2022-08-17 NOTE — Patient Instructions (Addendum)
Good to see you  Exercises given for foot Oofos or recover sandals in the house Ice at night Follow up in 6-8 weeks

## 2022-08-17 NOTE — Assessment & Plan Note (Signed)
Exacerbation secondary to patient's ambulation from her foot pain.  Discussed with patient to continue to work on weight loss and core stability.  Discussed which activities to do and which ones to avoid.  Continue to work on hip abductor strengthening.  Follow-up again in 6 to 8 weeks.

## 2022-08-17 NOTE — Assessment & Plan Note (Signed)
Discussed with patient I do believe there is a significant point of fasciitis.  Discussed with patient to avoid being barefoot, home exercises and work with Product/process development scientist today.  Discussed which activities to do and which ones to avoid.  Increase activity slowly.  Discussed avoiding being barefoot.  Follow-up again in 6 to 8 weeks.

## 2022-09-21 NOTE — Progress Notes (Signed)
  [place] Phone: [phone] Subjective:   [name], am serving as a scribe for Dr. Hulan Saas.  I'm seeing this patient by the request  of:  [name], [place]  CC: Neck and back pain follow-up  [id number]  BRAEDYN RIGGLE is a 47 y.o. female coming in with complaint of back and neck pain. OMT 08/17/2022. Also f/u for plantar fasciitis L foot. Patient states that her foot pain is doing much better, Using OOFOS. Back has also been doing well.   Medications patient has been prescribed: None  Taking:         Reviewed prior external information including notes and imaging from previsou exam, outside providers and external EMR if available.   As well as notes that were available from care everywhere and other healthcare systems.  Past medical history, social, surgical and family history all reviewed in electronic medical record.  No pertanent information unless stated regarding to the chief complaint.   Past Medical History:  Diagnosis Date   Abnormal pap 02/2003   CIN 2-3   Basal cell carcinoma of back 07/22/2021   GERD (gastroesophageal reflux disease)    Hyperlipidemia    Migraine with aura     No Known Allergies   Review of Systems:  No headache, visual changes, nausea, vomiting, diarrhea, constipation, dizziness, abdominal pain, skin rash, fevers, chills, night sweats, weight loss, swollen lymph nodes, body aches, joint swelling, chest pain, shortness of breath, mood changes. POSITIVE muscle aches  Objective  Blood pressure 112/82, pulse 86, height '5\' 8"'$  (1.727 m), weight 225 lb (102.1 kg), SpO2 98 %.   General: No apparent distress alert and oriented x3 mood and affect normal, dressed appropriately.  HEENT: Pupils equal, extraocular movements intact  Respiratory: Patient's speak in full sentences and does not appear short of breath  Cardiovascular: No lower extremity edema,  non tender, no erythema  Low back exam still has tightness noted more in the thoracolumbar than anywhere else today.  Seems to be mostly on the right side.  Does have some tightness with the FABER test on the right as well.  Osteopathic findings  T3 extended rotated and side bent right inhaled rib T11 extended rotated and side bent right L1 flexed rotated and side bent right Sacrum right on right       Assessment and Plan:  Low back pain Patient doing very well abnormal weight.  Patient has been exercising strengthening core but has not been doing as much standing.  Encouraged her to try to do that on a more regular basis.  Increase activity slowly.  Follow-up again in 2 to 3 months.    Nonallopathic problems  Decision today to treat with OMT was based on Physical Exam  After verbal consent patient was treated with HVLA, ME, FPR techniques in cervical, rib, thoracic, lumbar, and sacral  areas  Patient tolerated the procedure well with improvement in symptoms  Patient given exercises, stretches and lifestyle modifications  See medications in patient instructions if given  Patient will follow up in 4-8 weeks    The above documentation has been reviewed and is accurate and complete Lyndal Pulley, DO          Note: This dictation was prepared with Dragon dictation along with smaller phrase technology. Any transcriptional errors that result from this process are unintentional.

## 2022-09-28 ENCOUNTER — Ambulatory Visit: Payer: Self-pay

## 2022-09-28 ENCOUNTER — Encounter: Payer: Self-pay | Admitting: Family Medicine

## 2022-09-28 ENCOUNTER — Ambulatory Visit (INDEPENDENT_AMBULATORY_CARE_PROVIDER_SITE_OTHER): Payer: BC Managed Care – PPO | Admitting: Family Medicine

## 2022-09-28 VITALS — BP 112/82 | HR 86 | Ht 68.0 in | Wt 225.0 lb

## 2022-09-28 DIAGNOSIS — G8929 Other chronic pain: Secondary | ICD-10-CM

## 2022-09-28 DIAGNOSIS — M79672 Pain in left foot: Secondary | ICD-10-CM

## 2022-09-28 DIAGNOSIS — M9902 Segmental and somatic dysfunction of thoracic region: Secondary | ICD-10-CM

## 2022-09-28 DIAGNOSIS — M9904 Segmental and somatic dysfunction of sacral region: Secondary | ICD-10-CM

## 2022-09-28 DIAGNOSIS — M545 Low back pain, unspecified: Secondary | ICD-10-CM | POA: Diagnosis not present

## 2022-09-28 DIAGNOSIS — M9903 Segmental and somatic dysfunction of lumbar region: Secondary | ICD-10-CM | POA: Diagnosis not present

## 2022-09-28 NOTE — Patient Instructions (Signed)
Try the standing desk more See me in 10 weeks

## 2022-09-28 NOTE — Assessment & Plan Note (Signed)
Patient doing very well abnormal weight.  Patient has been exercising strengthening core but has not been doing as much standing.  Encouraged her to try to do that on a more regular basis.  Increase activity slowly.  Follow-up again in 2 to 3 months.

## 2022-10-13 ENCOUNTER — Other Ambulatory Visit: Payer: Self-pay | Admitting: Physician Assistant

## 2022-12-06 NOTE — Progress Notes (Unsigned)
[place] Phone: [phone] Subjective:   [name], am serving as a scribe for Dr. Hulan Saas.  I'm seeing this patient by the request  of:  [name], [place]  CC: Back and neck pain follow-up  [id number]  Susan [name] is a 47 y.o. female coming in with complaint of back and neck pain. OMT on 09/28/2022. Patient states that she moved 20 bags of mulch this past weekend but is doing well.     Medications patient has been prescribed:   Taking:         Reviewed prior external information including notes and imaging from previsou exam, outside providers and external EMR if available.   As well as notes that were available from care everywhere and other healthcare systems.  Past medical history, social, surgical and family history all reviewed in electronic medical record.  No pertanent information unless stated regarding to the chief complaint.   Past Medical History:  Diagnosis Date   Abnormal pap 02/2003   CIN 2-3   Basal cell carcinoma of back 07/22/2021   GERD (gastroesophageal reflux disease)    Hyperlipidemia    Migraine with aura     No Known Allergies   Review of Systems:  No headache, visual changes, nausea, vomiting, diarrhea, constipation, dizziness, abdominal pain, skin rash, fevers, chills, night sweats, weight loss, swollen lymph nodes, body aches, joint swelling, chest pain, shortness of breath, mood changes. POSITIVE muscle aches  Objective  Blood pressure 120/88, pulse 73, height 5\' 8"  (1.727 m), weight 226 lb (102.5 kg), SpO2 99 %.   General: No apparent distress alert and oriented x3 mood and affect normal, dressed appropriately.  HEENT: Pupils equal, extraocular movements intact  Respiratory: Patient's speak in full sentences and does not appear short of breath  Cardiovascular: Lower extremity swelling noted on the left leg. MSK:  Back does have some  loss of lordosis noted.  Patient does have some tightness with FABER test bilaterally.  The patient does have some tenderness to palpation in the paraspinal musculature  Osteopathic findings  C3 flexed rotated and side bent right C6 flexed rotated and side bent left T3 extended rotated and side bent right inhaled rib T7 extended rotated and side bent left L3 flexed rotated and side bent right Sacrum right on right     Assessment and Plan:  Plantar fasciitis of left foot Patient is doing better with the plantar fasciitis but does have the breakdown of the transverse arch.  I do believe that some of the swelling from the traumatic hematoma and seroma could be contributing to some of the discomfort as well.  Will refer patient for custom orthotics to see if this will be more beneficial in helping with the fatigue that she has at the end of the day.  Follow-up with me again in 8 weeks  8 weeks   Low back pain Low back and multifactorial.  Continue to work on core strengthening.  Discussed still weight loss.  The patient's feet and abnormality could also be contributing.  Discussed with patient about icing regimen and home exercises otherwise.  Increase activity slowly.  Follow-up again in 6 to 8 weeks    Nonallopathic problems  Decision today to treat with OMT was based on Physical Exam  After verbal consent patient was treated with HVLA, ME, FPR techniques in cervical, rib, thoracic, lumbar, and sacral  areas  Patient tolerated the procedure well  with improvement in symptoms  Patient given exercises, stretches and lifestyle modifications  See medications in patient instructions if given  Patient will follow up in 4-8 weeks     The above documentation has been reviewed and is accurate and complete Lyndal Pulley, DO         Note: This dictation was prepared with Dragon dictation along with smaller phrase technology. Any transcriptional errors that result from this process  are unintentional.

## 2022-12-07 ENCOUNTER — Other Ambulatory Visit: Payer: Self-pay | Admitting: Family Medicine

## 2022-12-07 ENCOUNTER — Ambulatory Visit (INDEPENDENT_AMBULATORY_CARE_PROVIDER_SITE_OTHER): Payer: BC Managed Care – PPO | Admitting: Family Medicine

## 2022-12-07 VITALS — BP 120/88 | HR 73 | Ht 68.0 in | Wt 226.0 lb

## 2022-12-07 DIAGNOSIS — M9901 Segmental and somatic dysfunction of cervical region: Secondary | ICD-10-CM | POA: Diagnosis not present

## 2022-12-07 DIAGNOSIS — M9903 Segmental and somatic dysfunction of lumbar region: Secondary | ICD-10-CM

## 2022-12-07 DIAGNOSIS — G8929 Other chronic pain: Secondary | ICD-10-CM

## 2022-12-07 DIAGNOSIS — M9904 Segmental and somatic dysfunction of sacral region: Secondary | ICD-10-CM | POA: Diagnosis not present

## 2022-12-07 DIAGNOSIS — M216X9 Other acquired deformities of unspecified foot: Secondary | ICD-10-CM

## 2022-12-07 DIAGNOSIS — M545 Low back pain, unspecified: Secondary | ICD-10-CM | POA: Diagnosis not present

## 2022-12-07 DIAGNOSIS — M722 Plantar fascial fibromatosis: Secondary | ICD-10-CM

## 2022-12-07 DIAGNOSIS — M9902 Segmental and somatic dysfunction of thoracic region: Secondary | ICD-10-CM | POA: Diagnosis not present

## 2022-12-07 DIAGNOSIS — M9908 Segmental and somatic dysfunction of rib cage: Secondary | ICD-10-CM

## 2022-12-07 NOTE — Assessment & Plan Note (Signed)
Patient is doing better with the plantar fasciitis but does have the breakdown of the transverse arch.  I do believe that some of the swelling from the traumatic hematoma and seroma could be contributing to some of the discomfort as well.  Will refer patient for custom orthotics to see if this will be more beneficial in helping with the fatigue that she has at the end of the day.  Follow-up with me again in 8 weeks  8 weeks

## 2022-12-07 NOTE — Patient Instructions (Signed)
Orthotics Keep watching leg See me again in 8 weeks

## 2022-12-07 NOTE — Assessment & Plan Note (Signed)
Low back and multifactorial.  Continue to work on core strengthening.  Discussed still weight loss.  The patient's feet and abnormality could also be contributing.  Discussed with patient about icing regimen and home exercises otherwise.  Increase activity slowly.  Follow-up again in 6 to 8 weeks

## 2022-12-22 ENCOUNTER — Ambulatory Visit (INDEPENDENT_AMBULATORY_CARE_PROVIDER_SITE_OTHER): Payer: BC Managed Care – PPO | Admitting: Family Medicine

## 2022-12-22 VITALS — BP 124/86 | Ht 68.0 in | Wt 220.0 lb

## 2022-12-22 DIAGNOSIS — M25579 Pain in unspecified ankle and joints of unspecified foot: Secondary | ICD-10-CM

## 2022-12-22 NOTE — Progress Notes (Signed)
She presents today chief complaint of bilateral foot pain.  She was sent here from Dr. Katrinka Blazing requesting orthotics.  She has tried some over-the-counter aetrex orthotics and given her some relief however she is beginning to get achiness in her feet again.  She works in [employer], seated for work but enjoys walking for exercise and on trips.  She has been told as a child she had flatfeet.  Bilateral feet: Pes planus, splaying between her second and third toe.  Left foot slight hammering bunion deformity of the third phalanx.  Patient was fitted for a fast-track, dress orthotic.   The orthotic was heated and afterward the patient stood on the orthotic base positioned on the orthotic stand. The patient was positioned in subtalar neutral position and 10 degrees of ankle dorsiflexion in a weight bearing stance. After completion of molding, a stable base was applied to the orthotic blank. The base was ground to a stable position for weight bearing. Size: 10 Base: White Additional Posting and Padding: None The patient ambulated these, and they were very comfortable.  Discussed with patient to slowly break these orthotics in over time may be switch halfway through the need to her more soft cushioned Hx.

## 2022-12-22 NOTE — Progress Notes (Signed)
[facility]: Attending Note: I have examined the patient, reviewed the chart, discussed the assessment and plan with the Sports Medicine Fellow. I agree with assessment and treatment plan as detailed in the Fellow's note.  

## 2023-01-13 ENCOUNTER — Other Ambulatory Visit: Payer: Self-pay | Admitting: Physician Assistant

## 2023-01-31 NOTE — Progress Notes (Unsigned)
[name] [address] [place] [address] Phone: [phone] Subjective:   [name], am serving as a scribe for Dr. Antoine Primas.  I'm seeing this patient by the request  of:  Jarold Motto, [name]  CC: Neck and back pain  [id number]  Susan [name] is a 47 y.o. female coming in with complaint of back and neck pain. OMT on 12/07/2022. Patient states same per usual. Doing well. No new concerns.  Medications patient has been prescribed:   Taking:         Reviewed prior external information including notes and imaging from previsou exam, outside providers and external EMR if available.   As well as notes that were available from care everywhere and other healthcare systems.  Past medical history, social, surgical and family history all reviewed in electronic medical record.  No pertanent information unless stated regarding to the chief complaint.   Past Medical History:  Diagnosis Date   Abnormal pap 02/2003   CIN 2-3   Basal cell carcinoma of back 07/22/2021   GERD (gastroesophageal reflux disease)    Hyperlipidemia    Migraine with aura     No Known Allergies   Review of Systems:  No headache, visual changes, nausea, vomiting, diarrhea, constipation, dizziness, abdominal pain, skin rash, fevers, chills, night sweats, weight loss, swollen lymph nodes, body aches, joint swelling, chest pain, shortness of breath, mood changes. POSITIVE muscle aches  Objective  Blood pressure 116/78, pulse 64, height 5\' 8"  (1.727 m), weight 228 lb (103.4 kg), SpO2 93 %.   General: No apparent distress alert and oriented x3 mood and affect normal, dressed appropriately.  HEENT: Pupils equal, extraocular movements intact  Respiratory: Patient's speak in full sentences and does not appear short of breath  Cardiovascular: No lower extremity edema, non tender, no erythema  Low back does have some tightness noted.  Very mild leak rotation  noted.  Patient does have some tenderness to palpation in the paraspinal musculature.  Osteopathic findings  C2 flexed rotated and side bent right C7 flexed rotated and side bent left T3 extended rotated and side bent right inhaled rib T1 extended rotated and side bent left L1 flexed rotated and side bent right L4 flexed rotated and side bent left Sacrum right on right       Assessment and Plan:  Low back pain Chronic overall but patient is making good results so far.  Patient is doing well with her weight at the moment.  Discussed with patient to continue to work on it.  Goal should be around 200.  Discussed icing regimen and home exercises.  Patient will be more active this year with her not having a leg injury.  Does have some stress at work we will need to monitor.  Follow-up again in 6 to 8 weeks    Nonallopathic problems  Decision today to treat with OMT was based on Physical Exam  After verbal consent patient was treated with HVLA, ME, FPR techniques in cervical, rib, thoracic, lumbar, and sacral  areas  Patient tolerated the procedure well with improvement in symptoms  Patient given exercises, stretches and lifestyle modifications  See medications in patient instructions if given  Patient will follow up in 4-8 weeks     The above documentation has been reviewed and is accurate and complete Judi Saa, DO         Note: This dictation was prepared with Dragon dictation along with smaller phrase  technology. Any transcriptional errors that result from this process are unintentional.

## 2023-02-01 ENCOUNTER — Encounter: Payer: Self-pay | Admitting: Family Medicine

## 2023-02-01 ENCOUNTER — Ambulatory Visit (INDEPENDENT_AMBULATORY_CARE_PROVIDER_SITE_OTHER): Payer: BC Managed Care – PPO | Admitting: Family Medicine

## 2023-02-01 VITALS — BP 116/78 | HR 64 | Ht 68.0 in | Wt 228.0 lb

## 2023-02-01 DIAGNOSIS — M9904 Segmental and somatic dysfunction of sacral region: Secondary | ICD-10-CM

## 2023-02-01 DIAGNOSIS — M545 Low back pain, unspecified: Secondary | ICD-10-CM | POA: Diagnosis not present

## 2023-02-01 DIAGNOSIS — G8929 Other chronic pain: Secondary | ICD-10-CM

## 2023-02-01 DIAGNOSIS — M9903 Segmental and somatic dysfunction of lumbar region: Secondary | ICD-10-CM

## 2023-02-01 DIAGNOSIS — M9908 Segmental and somatic dysfunction of rib cage: Secondary | ICD-10-CM

## 2023-02-01 DIAGNOSIS — M9901 Segmental and somatic dysfunction of cervical region: Secondary | ICD-10-CM

## 2023-02-01 DIAGNOSIS — M9902 Segmental and somatic dysfunction of thoracic region: Secondary | ICD-10-CM | POA: Diagnosis not present

## 2023-02-01 NOTE — Assessment & Plan Note (Signed)
Chronic overall but patient is making good results so far.  Patient is doing well with her weight at the moment.  Discussed with patient to continue to work on it.  Goal should be around 200.  Discussed icing regimen and home exercises.  Patient will be more active this year with her not having a leg injury.  Does have some stress at work we will need to monitor.  Follow-up again in 6 to 8 weeks

## 2023-02-01 NOTE — Patient Instructions (Signed)
Good to see you! Enjoy that garden See you again in 10 weeks

## 2023-03-12 ENCOUNTER — Other Ambulatory Visit: Payer: Self-pay | Admitting: Family Medicine

## 2023-03-26 ENCOUNTER — Encounter: Payer: Self-pay | Admitting: Cardiology

## 2023-03-26 ENCOUNTER — Encounter: Payer: Self-pay | Admitting: Physician Assistant

## 2023-03-26 NOTE — Telephone Encounter (Signed)
Please see message and results

## 2023-04-04 NOTE — Progress Notes (Signed)
[name] [address] [place] [address] Phone: [phone] Subjective:   Susan [name], am serving as a scribe for Dr. Antoine [name].  I'm seeing this patient by the request  of:  Susan [name], Susan [name]  CC: Low back and elbow pain  PIR:[id number]  Susan [name] is a 47 y.o. female coming in with complaint of back and neck pain. OMT on 02/01/2023. Patient states same per usual. Forearm pain coming from lateral elbow. Has been happening for about 6 weeks.  Medications patient has been prescribed: zanaflex  Taking:         Reviewed prior external information including notes and imaging from previsou exam, outside providers and external EMR if available.   As well as notes that were available from care everywhere and other healthcare systems.  Past medical history, social, surgical and family history all reviewed in electronic medical record.  No pertanent information unless stated regarding to the chief complaint.   Past Medical History:  Diagnosis Date   Abnormal pap 02/2003   CIN 2-3   Basal cell carcinoma of back 07/22/2021   GERD (gastroesophageal reflux disease)    Hyperlipidemia    Migraine with aura     No Known Allergies   Review of Systems:  No headache, visual changes, nausea, vomiting, diarrhea, constipation, dizziness, abdominal pain, skin rash, fevers, chills, night sweats, weight loss, swollen lymph nodes, body aches, joint swelling, chest pain, shortness of breath, mood changes. POSITIVE muscle aches  Objective  Blood pressure 110/74, pulse 68, height 5\' 8"  (1.727 m), weight 228 lb (103.4 kg), SpO2 98%.   General: No apparent distress alert and oriented x3 mood and affect normal, dressed appropriately.  HEENT: Pupils equal, extraocular movements intact  Respiratory: Patient's speak in full sentences and does not appear short of breath  Cardiovascular: No lower extremity edema, non tender, no erythema   Patient does have significant tenderness over the lateral epicondylar area.  Full range of motion of the elbow noted.  No severe tenderness with resisted wrist extension at the elbow.  Good grip strength noted though.  Negative Tinel's noted.  Low back exam does have some loss of lordosis.  Tightness noted with FABER test right greater than left.  Negative straight leg test noted.   Osteopathic findings  C2 flexed rotated and side bent right C5 flexed rotated and side bent left T3 extended rotated and side bent right inhaled rib L4 flexed rotated and side bent right Sacrum right on right   Limited muscular skeletal ultrasound was performed and interpreted by Susan [name], M  Limited ultrasound of patient's common extensor tendon does show some very mild intersubstance tearing near the insertion.  Some increase in Doppler flow.  No significant retraction noted.  No cortical irregularity. Impression: Severe lateral epicondylitis    Assessment and Plan:  Right lateral epicondylitis Elbow anatomy was reviewed, and tendinopathy was explained.  Pt. given a home rehab program. Start with isometrics and ROM, then a series of concentric and eccentric exercises should be done starting with no weight, work up to 1 lb, hammer, etc.  Use counterforce strap if working or using hands.  Formal PT would be beneficial. Emphasized stretching an cross-friction massage Emphasized proper palms up lifting biomechanics to unload ECRB Patient will follow-up with me again in 4 to 6 weeks and if worsening pain can consider the possibility of PRP.    Nonallopathic problems  Decision today to treat with OMT was based  on Physical Exam  After verbal consent patient was treated with HVLA, ME, FPR techniques in cervical, rib, thoracic, lumbar, and sacral  areas  Patient tolerated the procedure well with improvement in symptoms  Patient given exercises, stretches and lifestyle modifications  See  medications in patient instructions if given  Patient will follow up in 4-8 weeks     The above documentation has been reviewed and is accurate and complete Susan Saa, DO         Note: This dictation was prepared with Dragon dictation along with smaller phrase technology. Any transcriptional errors that result from this process are unintentional.

## 2023-04-11 ENCOUNTER — Encounter: Payer: Self-pay | Admitting: Family Medicine

## 2023-04-12 ENCOUNTER — Ambulatory Visit (INDEPENDENT_AMBULATORY_CARE_PROVIDER_SITE_OTHER): Payer: BC Managed Care – PPO | Admitting: Family Medicine

## 2023-04-12 ENCOUNTER — Other Ambulatory Visit: Payer: Self-pay

## 2023-04-12 ENCOUNTER — Encounter: Payer: Self-pay | Admitting: Family Medicine

## 2023-04-12 VITALS — BP 110/74 | HR 68 | Ht 68.0 in | Wt 228.0 lb

## 2023-04-12 DIAGNOSIS — M9902 Segmental and somatic dysfunction of thoracic region: Secondary | ICD-10-CM | POA: Diagnosis not present

## 2023-04-12 DIAGNOSIS — M9904 Segmental and somatic dysfunction of sacral region: Secondary | ICD-10-CM

## 2023-04-12 DIAGNOSIS — M7711 Lateral epicondylitis, right elbow: Secondary | ICD-10-CM | POA: Diagnosis not present

## 2023-04-12 DIAGNOSIS — M25531 Pain in right wrist: Secondary | ICD-10-CM

## 2023-04-12 DIAGNOSIS — M545 Low back pain, unspecified: Secondary | ICD-10-CM | POA: Diagnosis not present

## 2023-04-12 DIAGNOSIS — M9903 Segmental and somatic dysfunction of lumbar region: Secondary | ICD-10-CM

## 2023-04-12 DIAGNOSIS — M9908 Segmental and somatic dysfunction of rib cage: Secondary | ICD-10-CM

## 2023-04-12 DIAGNOSIS — M9901 Segmental and somatic dysfunction of cervical region: Secondary | ICD-10-CM

## 2023-04-12 DIAGNOSIS — G8929 Other chronic pain: Secondary | ICD-10-CM

## 2023-04-12 NOTE — Assessment & Plan Note (Signed)
Low back exam does have some loss of lordosis noted.  The patient did respond extremely well to osteopathic manipulation.  Has not been able to be quite as active as usual.  Did travel recently and did have to do a little bit more sitting.  Discussed which activities to do and which ones to avoid.  Increase activity slowly otherwise.  Follow-up again 6 to 8 weeks

## 2023-04-12 NOTE — Assessment & Plan Note (Signed)
Elbow anatomy was reviewed, and tendinopathy was explained.  Pt. given a home rehab program. Start with isometrics and ROM, then a series of concentric and eccentric exercises should be done starting with no weight, work up to 1 lb, hammer, etc.  Use counterforce strap if working or using hands.  Formal PT would be beneficial. Emphasized stretching an cross-friction massage Emphasized proper palms up lifting biomechanics to unload ECRB Patient will follow-up with me again in 4 to 6 weeks and if worsening pain can consider the possibility of PRP.

## 2023-04-12 NOTE — Patient Instructions (Addendum)
Brace day and night for 2 weeks Then only at night for another 2 weeks You have 14 days to return or exchange your brace Call [phone], then return the brace to our office  Do prescribed exercises at least 3x a week See you again in 6-8 weeks

## 2023-04-18 ENCOUNTER — Other Ambulatory Visit: Payer: Self-pay | Admitting: Physician Assistant

## 2023-04-20 ENCOUNTER — Encounter: Payer: Self-pay | Admitting: *Deleted

## 2023-05-23 ENCOUNTER — Ambulatory Visit (INDEPENDENT_AMBULATORY_CARE_PROVIDER_SITE_OTHER): Payer: BC Managed Care – PPO | Admitting: Student

## 2023-05-23 ENCOUNTER — Other Ambulatory Visit (HOSPITAL_BASED_OUTPATIENT_CLINIC_OR_DEPARTMENT_OTHER): Payer: Self-pay

## 2023-05-23 ENCOUNTER — Encounter (HOSPITAL_BASED_OUTPATIENT_CLINIC_OR_DEPARTMENT_OTHER): Payer: Self-pay | Admitting: Student

## 2023-05-23 ENCOUNTER — Ambulatory Visit (HOSPITAL_BASED_OUTPATIENT_CLINIC_OR_DEPARTMENT_OTHER): Payer: BC Managed Care – PPO

## 2023-05-23 DIAGNOSIS — M25552 Pain in left hip: Secondary | ICD-10-CM | POA: Diagnosis not present

## 2023-05-23 DIAGNOSIS — M160 Bilateral primary osteoarthritis of hip: Secondary | ICD-10-CM | POA: Diagnosis not present

## 2023-05-23 MED ORDER — MELOXICAM 15 MG PO TABS
15.0000 mg | ORAL_TABLET | Freq: Every day | ORAL | 0 refills | Status: AC
  Filled 2023-05-23: qty 15, 15d supply, fill #0

## 2023-05-23 MED ORDER — METHOCARBAMOL 500 MG PO TABS
500.0000 mg | ORAL_TABLET | Freq: Four times a day (QID) | ORAL | 0 refills | Status: AC | PRN
  Filled 2023-05-23: qty 40, 10d supply, fill #0

## 2023-05-23 NOTE — Progress Notes (Signed)
Chief Complaint: Left hip pain     History of Present Illness:    Susan [name] is a pleasant 47 y.o. female presenting today for evaluation of left hip pain.  Patient states that this started about 4 days ago with no known cause or injury.  Since that time, her symptoms have been slowly worsening.  Pain is located in the left posterior hip and radiates around to the lateral hip.  She does have a history of low back pain, however she states that this feels much different.  Denies any groin pain, radiating pain, numbness, or tingling.  She has been taking ibuprofen every 8 hours which has been giving some relief.  She has had a hard time sleeping over the last few days.  She is leaving for a 9 day trip to [place] in 3 days.   Surgical History:   None  PMH/PSH/Family History/Social History/Meds/Allergies:    Past Medical History:  Diagnosis Date   Abnormal pap 02/2003   CIN 2-3   Basal cell carcinoma of back 07/22/2021   GERD (gastroesophageal reflux disease)    Hyperlipidemia    Migraine with aura    Past Surgical History:  Procedure Laterality Date   BREAST BIOPSY Left 08/04/2022   [place] LT BREAST BX W LOC DEV 1ST LESION IMG BX SPEC US GUIDE 08/04/2022 [facility]   CERVICAL BIOPSY  W/ LOOP ELECTRODE EXCISION  02/2003   CIN 2-3   CHOLECYSTECTOMY  2015   COSMETIC SURGERY  2009   Social History   Socioeconomic History   Marital status: Married    Spouse name: Not on file   Number of children: 0   Years of education: Not on file   Highest education level: Not on file  Occupational History    Employer: [employer]  Tobacco Use   Smoking status: Never   Smokeless tobacco: Never  Vaping Use   Vaping status: Never Used  Substance and Sexual Activity   Alcohol use: Yes    Alcohol/week: 2.0 standard drinks of alcohol    Types: 2 Glasses of wine per week    Comment: 1-2 glasses/week   Drug use: No   Sexual activity: Yes     Partners: Male    Birth control/protection: Condom  Other Topics Concern   Not on file  Social History Narrative   Married.    No children.    Social Determinants of Health   Financial Resource Strain: Not on file  Food Insecurity: Not on file  Transportation Needs: Not on file  Physical Activity: Not on file  Stress: Not on file  Social Connections: Not on file   Family History  Problem Relation Age of Onset   HIV Mother    Other Mother    Early death Mother        Age 70   Hypertension Father    Depression Father        Suicide   Early death Brother        Age 17   Drug abuse Brother    Hypertension Maternal Grandmother    Diabetes Maternal Grandmother    Stroke Maternal Grandmother    Heart failure Maternal Grandfather    Heart disease Maternal Grandfather    Diabetes Paternal Grandmother    Depression Paternal Grandfather  Suicide   Colon cancer Neg Hx    Colon polyps Neg Hx    Esophageal cancer Neg Hx    Rectal cancer Neg Hx    Stomach cancer Neg Hx    No Known Allergies Current Outpatient Medications  Medication Sig Dispense Refill   meloxicam (MOBIC) 15 MG tablet Take 1 tablet (15 mg total) by mouth daily for 15 days. 15 tablet 0   methocarbamol (ROBAXIN) 500 MG tablet Take 1 tablet (500 mg total) by mouth every 6 (six) hours as needed for up to 10 days for muscle spasms. 40 tablet 0   desoximetasone (TOPICORT) 0.25 % cream Use 1 X daily prn. But not long term 30 g 2   LORazepam (ATIVAN) 0.5 MG tablet Take 1 tablet (0.5 mg total) by mouth 2 (two) times daily as needed for anxiety. 20 tablet 0   Multiple Vitamins-Minerals (MULTIVITAMIN PO) Take by mouth daily.     pantoprazole (PROTONIX) 40 MG tablet TAKE 1 TABLET BY MOUTH EVERY DAY 90 tablet 1   phentermine 37.5 MG capsule Take 1 capsule (37.5 mg total) by mouth every morning. 30 capsule 2   pravastatin (PRAVACHOL) 80 MG tablet Take 1 tablet (80 mg total) by mouth daily. 90 tablet 3   TART CHERRY PO  Take by mouth.     tiZANidine (ZANAFLEX) 4 MG tablet TAKE 1 TABLET BY MOUTH EVERYDAY AT BEDTIME 90 tablet 0   traZODone (DESYREL) 100 MG tablet TAKE 1 TABLET BY MOUTH EVERYDAY AT BEDTIME 90 tablet 1   Vitamin D 12.5 MCG/0.25ML LIQD Take by mouth.     No current facility-administered medications for this visit.   No results found.  Review of Systems:   A ROS was performed including pertinent positives and negatives as documented in the HPI.  Physical Exam :   Constitutional: NAD and appears stated age Neurological: Alert and oriented Psych: Appropriate affect and cooperative Last menstrual period 07/07/2022.   Comprehensive Musculoskeletal Exam:    No significant tenderness to palpation over the lumbar spine, posterior left hip, or left greater trochanter.  Passive range of motion of the left hip to 110 degrees flexion, 40 degrees external rotation, and 20 degrees internal rotation.  Knee flexion extension strength is 5/5.  Hip resisted abduction strength is 5/5.  Imaging:   Xray (AP pelvis, left hip 3 views): Negative    I personally reviewed and interpreted the radiographs.   Assessment:   47 y.o. female with acute posterior left hip pain.  Based on her exam today and negative radiographs, I do believe that these symptoms are muscular in nature.  She does have good strength throughout the hip and knee which lowers concern for possible tear.  I do believe she would benefit from some anti-inflammatories as well as muscle relaxer, so I will plan on sending these in particularly given her upcoming trip.  Discussed that if she continues to have symptoms, I would recommend referral to physical therapy for additional stretching and strengthening of the hip.  Will plan to follow-up as needed.  Plan :    -Start meloxicam 15 mg -Start Robaxin 500 mg as needed - Return to clinic as needed    I personally saw and evaluated the patient, and participated in the management and treatment  plan.  Hazle Nordmann, PA-C Orthopedics

## 2023-06-05 NOTE — Progress Notes (Unsigned)
[name] [address] [place] [address] Phone: [phone] Subjective:   Susan [name], am serving as a scribe for Dr. Antoine Primas.  I'm seeing this patient by the request  of:  Susan [name], [name]  CC: Back and neck pain follow-up  [id number]  Susan [name] is a 47 y.o. female coming in with complaint of back and neck pain, was having some left hip pain and saw another provider before she went to [place]. OMT on 04/12/2023.  Patient was also having severe lateral epicondylitis last follow-up.  Given bracing was to do topical anti-inflammatories, icing regimen and PRP.  Patient states that on Labor Day she developed L lower back pain. Was seen by provider at [facility]. Went to [employer] last Saturday. On Thursday she was going down some wooden steps and her foot slipped in the snow. Walked all day on Friday and she was in extreme pain. Pain moves from L to R side of lower back. Laid in hotel room for 2 days. Had injection by provider in [place] on Sunday. Pain increased during flight home. Yesterday patient was laying at home. Pain today is in the R posterior hip and groin.   Taking IBU and tilidin.   Medications patient has been prescribed:   Taking:         Reviewed prior external information including notes and imaging from previsou exam, outside providers and external EMR if available.   As well as notes that were available from care everywhere and other healthcare systems.  Past medical history, social, surgical and family history all reviewed in electronic medical record.  No pertanent information unless stated regarding to the chief complaint.   Past Medical History:  Diagnosis Date   Abnormal pap 02/2003   CIN 2-3   Basal cell carcinoma of back 07/22/2021   GERD (gastroesophageal reflux disease)    Hyperlipidemia    Migraine with aura     No Known Allergies   Review of Systems:  No headache, visual changes,  nausea, vomiting, diarrhea, constipation, dizziness, abdominal pain, skin rash, fevers, chills, night sweats, weight loss, swollen lymph nodes, body aches, joint swelling, chest pain, shortness of breath, mood changes. POSITIVE muscle aches  Objective  Blood pressure 122/78, pulse 86, height 5\' 8"  (1.727 m), SpO2 94%.   General: No apparent distress alert and oriented x3 mood and affect normal, dressed appropriately.  HEENT: Pupils equal, extraocular movements intact  Respiratory: Patient's speak in full sentences and does not appear short of breath  Cardiovascular: No lower extremity edema, non tender, no erythema  Back exam has significant loss of lordosis.  Patient does have an antalgic gait noted.  Patient has worsening pain with any type of flexion and extension.  Patient does have positive straight leg test with radicular symptoms on the left side.  This is at 20 degrees of flexion.  Deep tendon reflex 1+       Assessment and Plan:  Low back pain Patient recently had a fall and on x-ray there is a abnormality noted of the sacrum.  Patient also has a very mild narrowing of the right hip that is concerning as well.  The radicular symptoms is fairly significant and patient has been very tender on exam.  Toradol and Depo-Medrol given today.  Prednisone given but I am concerned the patient does have a true herniated disc and has not been treated as aggressively with her traveling internationally recently.  Would like advanced imaging  at this point to further evaluate the sacrum for the possible fracture as well as for herniated disc and nerve impingement.  Patient knows that worsening symptoms to seek medical attention immediately.       The above documentation has been reviewed and is accurate and complete Judi Saa, DO         Note: This dictation was prepared with Dragon dictation along with smaller phrase technology. Any transcriptional errors that result from this process  are unintentional.

## 2023-06-06 ENCOUNTER — Other Ambulatory Visit: Payer: Self-pay

## 2023-06-06 ENCOUNTER — Ambulatory Visit (INDEPENDENT_AMBULATORY_CARE_PROVIDER_SITE_OTHER): Payer: BC Managed Care – PPO

## 2023-06-06 ENCOUNTER — Ambulatory Visit (INDEPENDENT_AMBULATORY_CARE_PROVIDER_SITE_OTHER): Payer: BC Managed Care – PPO | Admitting: Family Medicine

## 2023-06-06 ENCOUNTER — Encounter: Payer: Self-pay | Admitting: Family Medicine

## 2023-06-06 VITALS — BP 122/78 | HR 86 | Ht 68.0 in

## 2023-06-06 DIAGNOSIS — G8929 Other chronic pain: Secondary | ICD-10-CM

## 2023-06-06 DIAGNOSIS — M47817 Spondylosis without myelopathy or radiculopathy, lumbosacral region: Secondary | ICD-10-CM | POA: Diagnosis not present

## 2023-06-06 DIAGNOSIS — M25521 Pain in right elbow: Secondary | ICD-10-CM | POA: Diagnosis not present

## 2023-06-06 DIAGNOSIS — M549 Dorsalgia, unspecified: Secondary | ICD-10-CM | POA: Diagnosis not present

## 2023-06-06 DIAGNOSIS — R102 Pelvic and perineal pain: Secondary | ICD-10-CM | POA: Diagnosis not present

## 2023-06-06 DIAGNOSIS — M545 Low back pain, unspecified: Secondary | ICD-10-CM

## 2023-06-06 DIAGNOSIS — M4807 Spinal stenosis, lumbosacral region: Secondary | ICD-10-CM | POA: Diagnosis not present

## 2023-06-06 DIAGNOSIS — M25551 Pain in right hip: Secondary | ICD-10-CM | POA: Diagnosis not present

## 2023-06-06 MED ORDER — PREDNISONE 20 MG PO TABS: 40.0000 mg | ORAL_TABLET | Freq: Every day | ORAL | 0 refills | Status: DC

## 2023-06-06 MED ORDER — GABAPENTIN 300 MG PO CAPS: 300.0000 mg | ORAL_CAPSULE | Freq: Every day | ORAL | 0 refills | Status: DC

## 2023-06-06 MED ORDER — METHYLPREDNISOLONE ACETATE 80 MG/ML IJ SUSP
80.0000 mg | Freq: Once | INTRAMUSCULAR | Status: AC
  Administered 2023-06-06: 80 mg via INTRAMUSCULAR

## 2023-06-06 MED ORDER — KETOROLAC TROMETHAMINE 60 MG/2ML IM SOLN
60.0000 mg | Freq: Once | INTRAMUSCULAR | Status: AC
  Administered 2023-06-06: 60 mg via INTRAMUSCULAR

## 2023-06-06 NOTE — Patient Instructions (Signed)
MRI pelvis  Prednisone 40mg  for 5 days Gabapentin 300mg  at night Please do into ED if symptoms worsen  We will be in touch

## 2023-06-06 NOTE — Assessment & Plan Note (Signed)
Patient recently had a fall and on x-ray there is a abnormality noted of the sacrum.  Patient also has a very mild narrowing of the right hip that is concerning as well.  The radicular symptoms is fairly significant and patient has been very tender on exam.  Toradol and Depo-Medrol given today.  Prednisone given but I am concerned the patient does have a true herniated disc and has not been treated as aggressively with her traveling internationally recently.  Would like advanced imaging at this point to further evaluate the sacrum for the possible fracture as well as for herniated disc and nerve impingement.  Patient knows that worsening symptoms to seek medical attention immediately.

## 2023-06-07 ENCOUNTER — Emergency Department (HOSPITAL_COMMUNITY)
Admission: EM | Admit: 2023-06-07 | Discharge: 2023-06-07 | Discharge status: Against Medical Advice | Payer: BC Managed Care – PPO | Attending: Family Medicine | Admitting: Family Medicine

## 2023-06-07 ENCOUNTER — Telehealth: Payer: Self-pay | Admitting: Family Medicine

## 2023-06-07 ENCOUNTER — Emergency Department (HOSPITAL_BASED_OUTPATIENT_CLINIC_OR_DEPARTMENT_OTHER)
Admit: 2023-06-07 | Discharge: 2023-06-07 | Disposition: A | Discharge status: Home / Self Care | Payer: BC Managed Care – PPO | Attending: Family Medicine | Admitting: Family Medicine

## 2023-06-07 DIAGNOSIS — S322XXA Fracture of coccyx, initial encounter for closed fracture: Secondary | ICD-10-CM | POA: Diagnosis not present

## 2023-06-07 DIAGNOSIS — R102 Pelvic and perineal pain: Secondary | ICD-10-CM

## 2023-06-07 DIAGNOSIS — M25551 Pain in right hip: Secondary | ICD-10-CM | POA: Diagnosis not present

## 2023-06-07 DIAGNOSIS — Z5321 Procedure and treatment not carried out due to patient leaving prior to being seen by health care provider: Secondary | ICD-10-CM | POA: Diagnosis not present

## 2023-06-07 DIAGNOSIS — N854 Malposition of uterus: Secondary | ICD-10-CM | POA: Diagnosis not present

## 2023-06-07 DIAGNOSIS — X58XXXA Exposure to other specified factors, initial encounter: Secondary | ICD-10-CM | POA: Diagnosis not present

## 2023-06-07 DIAGNOSIS — D259 Leiomyoma of uterus, unspecified: Secondary | ICD-10-CM | POA: Diagnosis not present

## 2023-06-07 DIAGNOSIS — M549 Dorsalgia, unspecified: Secondary | ICD-10-CM | POA: Diagnosis not present

## 2023-06-07 NOTE — Telephone Encounter (Signed)
Pt called, pain is no better. She is going to follow our advice and go to the ED in hopes of getting an MRI.

## 2023-06-07 NOTE — ED Triage Notes (Signed)
Patient here from home reporting ongoing back and right hip pain. Seen by sports med with no relief - states that he did recommend MRI for suspect fractures. Reports tailbone fracture.

## 2023-06-08 ENCOUNTER — Other Ambulatory Visit: Payer: Self-pay

## 2023-06-08 ENCOUNTER — Encounter: Payer: Self-pay | Admitting: Family Medicine

## 2023-06-08 DIAGNOSIS — M9903 Segmental and somatic dysfunction of lumbar region: Secondary | ICD-10-CM

## 2023-06-08 NOTE — Progress Notes (Signed)
Ordered and approved

## 2023-06-09 ENCOUNTER — Encounter (HOSPITAL_BASED_OUTPATIENT_CLINIC_OR_DEPARTMENT_OTHER): Payer: BC Managed Care – PPO | Admitting: Radiology

## 2023-06-09 DIAGNOSIS — Z1231 Encounter for screening mammogram for malignant neoplasm of breast: Secondary | ICD-10-CM

## 2023-06-09 MED ORDER — HYDROCODONE-ACETAMINOPHEN 7.5-325 MG PO TABS: 1.0000 | ORAL_TABLET | Freq: Four times a day (QID) | ORAL | 0 refills | Status: DC | PRN

## 2023-06-11 ENCOUNTER — Other Ambulatory Visit: Payer: Self-pay

## 2023-06-11 DIAGNOSIS — M545 Low back pain, unspecified: Secondary | ICD-10-CM

## 2023-06-11 MED ORDER — HYDROCODONE-ACETAMINOPHEN 7.5-325 MG PO TABS: 1.0000 | ORAL_TABLET | Freq: Four times a day (QID) | ORAL | 0 refills | Status: DC | PRN

## 2023-06-11 NOTE — Addendum Note (Signed)
Addended by: Judi Saa on: 06/11/2023 07:29 AM   Modules accepted: Orders

## 2023-06-15 ENCOUNTER — Inpatient Hospital Stay (HOSPITAL_COMMUNITY): Payer: BC Managed Care – PPO

## 2023-06-15 ENCOUNTER — Ambulatory Visit (HOSPITAL_BASED_OUTPATIENT_CLINIC_OR_DEPARTMENT_OTHER)
Admission: RE | Admit: 2023-06-15 | Discharge: 2023-06-15 | Disposition: A | Discharge status: Home / Self Care | Payer: BC Managed Care – PPO | Source: Ambulatory Visit | Attending: Family Medicine | Admitting: Family Medicine

## 2023-06-15 ENCOUNTER — Other Ambulatory Visit: Payer: Self-pay

## 2023-06-15 ENCOUNTER — Other Ambulatory Visit (HOSPITAL_COMMUNITY): Payer: BC Managed Care – PPO

## 2023-06-15 ENCOUNTER — Inpatient Hospital Stay (HOSPITAL_BASED_OUTPATIENT_CLINIC_OR_DEPARTMENT_OTHER)
Admission: EM | Admit: 2023-06-15 | Discharge: 2023-06-20 | DRG: 540 | Disposition: A | Discharge status: Home Health | Payer: BC Managed Care – PPO | Attending: Internal Medicine | Admitting: Internal Medicine

## 2023-06-15 ENCOUNTER — Encounter (HOSPITAL_BASED_OUTPATIENT_CLINIC_OR_DEPARTMENT_OTHER): Payer: Self-pay | Admitting: Emergency Medicine

## 2023-06-15 DIAGNOSIS — K219 Gastro-esophageal reflux disease without esophagitis: Secondary | ICD-10-CM | POA: Diagnosis present

## 2023-06-15 DIAGNOSIS — Z85828 Personal history of other malignant neoplasm of skin: Secondary | ICD-10-CM

## 2023-06-15 DIAGNOSIS — M4646 Discitis, unspecified, lumbar region: Secondary | ICD-10-CM | POA: Diagnosis not present

## 2023-06-15 DIAGNOSIS — R7881 Bacteremia: Secondary | ICD-10-CM | POA: Insufficient documentation

## 2023-06-15 DIAGNOSIS — W010XXA Fall on same level from slipping, tripping and stumbling without subsequent striking against object, initial encounter: Secondary | ICD-10-CM | POA: Diagnosis present

## 2023-06-15 DIAGNOSIS — E440 Moderate protein-calorie malnutrition: Secondary | ICD-10-CM | POA: Insufficient documentation

## 2023-06-15 DIAGNOSIS — Z7952 Long term (current) use of systemic steroids: Secondary | ICD-10-CM | POA: Diagnosis not present

## 2023-06-15 DIAGNOSIS — G061 Intraspinal abscess and granuloma: Secondary | ICD-10-CM | POA: Diagnosis not present

## 2023-06-15 DIAGNOSIS — M4647 Discitis, unspecified, lumbosacral region: Secondary | ICD-10-CM | POA: Diagnosis not present

## 2023-06-15 DIAGNOSIS — M4626 Osteomyelitis of vertebra, lumbar region: Secondary | ICD-10-CM | POA: Insufficient documentation

## 2023-06-15 DIAGNOSIS — E785 Hyperlipidemia, unspecified: Secondary | ICD-10-CM | POA: Diagnosis present

## 2023-06-15 DIAGNOSIS — Z6831 Body mass index (BMI) 31.0-31.9, adult: Secondary | ICD-10-CM

## 2023-06-15 DIAGNOSIS — M48061 Spinal stenosis, lumbar region without neurogenic claudication: Secondary | ICD-10-CM | POA: Insufficient documentation

## 2023-06-15 DIAGNOSIS — G629 Polyneuropathy, unspecified: Secondary | ICD-10-CM | POA: Diagnosis present

## 2023-06-15 DIAGNOSIS — D75838 Other thrombocytosis: Secondary | ICD-10-CM | POA: Diagnosis present

## 2023-06-15 DIAGNOSIS — A4901 Methicillin susceptible Staphylococcus aureus infection, unspecified site: Principal | ICD-10-CM

## 2023-06-15 DIAGNOSIS — Z79899 Other long term (current) drug therapy: Secondary | ICD-10-CM

## 2023-06-15 DIAGNOSIS — M4627 Osteomyelitis of vertebra, lumbosacral region: Principal | ICD-10-CM | POA: Diagnosis present

## 2023-06-15 DIAGNOSIS — M4640 Discitis, unspecified, site unspecified: Secondary | ICD-10-CM | POA: Diagnosis not present

## 2023-06-15 DIAGNOSIS — K5900 Constipation, unspecified: Secondary | ICD-10-CM | POA: Diagnosis not present

## 2023-06-15 DIAGNOSIS — M546 Pain in thoracic spine: Secondary | ICD-10-CM | POA: Diagnosis not present

## 2023-06-15 DIAGNOSIS — E7800 Pure hypercholesterolemia, unspecified: Secondary | ICD-10-CM | POA: Diagnosis not present

## 2023-06-15 DIAGNOSIS — R7989 Other specified abnormal findings of blood chemistry: Secondary | ICD-10-CM | POA: Diagnosis not present

## 2023-06-15 DIAGNOSIS — B9561 Methicillin susceptible Staphylococcus aureus infection as the cause of diseases classified elsewhere: Secondary | ICD-10-CM | POA: Diagnosis not present

## 2023-06-15 DIAGNOSIS — G062 Extradural and subdural abscess, unspecified: Secondary | ICD-10-CM | POA: Diagnosis not present

## 2023-06-15 DIAGNOSIS — Z043 Encounter for examination and observation following other accident: Secondary | ICD-10-CM | POA: Diagnosis not present

## 2023-06-15 DIAGNOSIS — E66811 Obesity, class 1: Secondary | ICD-10-CM | POA: Diagnosis present

## 2023-06-15 DIAGNOSIS — M9903 Segmental and somatic dysfunction of lumbar region: Secondary | ICD-10-CM

## 2023-06-15 DIAGNOSIS — Z9049 Acquired absence of other specified parts of digestive tract: Secondary | ICD-10-CM | POA: Diagnosis not present

## 2023-06-15 DIAGNOSIS — K7689 Other specified diseases of liver: Secondary | ICD-10-CM | POA: Diagnosis not present

## 2023-06-15 DIAGNOSIS — G96191 Perineural cyst: Secondary | ICD-10-CM | POA: Diagnosis not present

## 2023-06-15 DIAGNOSIS — R7401 Elevation of levels of liver transaminase levels: Secondary | ICD-10-CM | POA: Diagnosis not present

## 2023-06-15 LAB — COMPREHENSIVE METABOLIC PANEL
ALT: 128 U/L — ABNORMAL HIGH (ref 0–44)
AST: 47 U/L — ABNORMAL HIGH (ref 15–41)
Albumin: 4 g/dL (ref 3.5–5.0)
Alkaline Phosphatase: 277 U/L — ABNORMAL HIGH (ref 38–126)
Anion gap: 13 (ref 5–15)
BUN: 14 mg/dL (ref 6–20)
CO2: 26 mmol/L (ref 22–32)
Calcium: 9.9 mg/dL (ref 8.9–10.3)
Chloride: 99 mmol/L (ref 98–111)
Creatinine, Ser: 0.78 mg/dL (ref 0.44–1.00)
GFR, Estimated: >60 mL/min (ref >60)
Glucose, Bld: 103 mg/dL — ABNORMAL HIGH (ref 70–99)
Potassium: 3.8 mmol/L (ref 3.5–5.1)
Sodium: 138 mmol/L (ref 135–145)
Total Bilirubin: 0.6 mg/dL (ref 0.3–1.2)
Total Protein: 8 g/dL (ref 6.5–8.1)

## 2023-06-15 LAB — CBC WITH DIFFERENTIAL/PLATELET
Abs Immature Granulocytes: 0.28 10*3/uL — ABNORMAL HIGH (ref 0.00–0.07)
Basophils Absolute: 0.1 10*3/uL (ref 0.0–0.1)
Basophils Relative: 0 %
Eosinophils Absolute: 0.1 10*3/uL (ref 0.0–0.5)
Eosinophils Relative: 1 %
HCT: 31.4 % — ABNORMAL LOW (ref 36.0–46.0)
Hemoglobin: 10.3 g/dL — ABNORMAL LOW (ref 12.0–15.0)
Immature Granulocytes: 2 %
Lymphocytes Relative: 19 %
Lymphs Abs: 3.1 10*3/uL (ref 0.7–4.0)
MCH: 28.3 pg (ref 26.0–34.0)
MCHC: 32.8 g/dL (ref 30.0–36.0)
MCV: 86.3 fL (ref 80.0–100.0)
Monocytes Absolute: 0.9 10*3/uL (ref 0.1–1.0)
Monocytes Relative: 6 %
Neutro Abs: 12.3 10*3/uL — ABNORMAL HIGH (ref 1.7–7.7)
Neutrophils Relative %: 72 %
Platelets: 683 10*3/uL — ABNORMAL HIGH (ref 150–400)
RBC: 3.64 MIL/uL — ABNORMAL LOW (ref 3.87–5.11)
RDW: 14.6 % (ref 11.5–15.5)
WBC: 16.8 10*3/uL — ABNORMAL HIGH (ref 4.0–10.5)
nRBC: 0 % (ref 0.0–0.2)

## 2023-06-15 LAB — C-REACTIVE PROTEIN: CRP: 10.9 mg/dL — ABNORMAL HIGH (ref <1.0)

## 2023-06-15 LAB — SEDIMENTATION RATE: Sed Rate: 110 mm/h — ABNORMAL HIGH (ref 0–22)

## 2023-06-15 MED ORDER — PRAVASTATIN SODIUM 40 MG PO TABS
80.0000 mg | ORAL_TABLET | Freq: Every day | ORAL | Status: DC
  Filled 2023-06-15: qty 2

## 2023-06-15 MED ORDER — PANTOPRAZOLE SODIUM 40 MG PO TBEC
40.0000 mg | DELAYED_RELEASE_TABLET | Freq: Every day | ORAL | Status: DC
  Administered 2023-06-16 – 2023-06-20 (×5): 40 mg via ORAL
  Filled 2023-06-15 (×5): qty 1

## 2023-06-15 MED ORDER — ACETAMINOPHEN 325 MG PO TABS
650.0000 mg | ORAL_TABLET | Freq: Four times a day (QID) | ORAL | Status: DC | PRN
  Administered 2023-06-15: 650 mg via ORAL
  Filled 2023-06-15: qty 2

## 2023-06-15 MED ORDER — TRAZODONE HCL 50 MG PO TABS
100.0000 mg | ORAL_TABLET | Freq: Every day | ORAL | Status: DC
  Administered 2023-06-16 – 2023-06-19 (×4): 100 mg via ORAL
  Filled 2023-06-15 (×5): qty 2

## 2023-06-15 MED ORDER — GABAPENTIN 300 MG PO CAPS
300.0000 mg | ORAL_CAPSULE | Freq: Every day | ORAL | Status: DC
  Administered 2023-06-15 – 2023-06-19 (×5): 300 mg via ORAL
  Filled 2023-06-15 (×5): qty 1

## 2023-06-15 MED ORDER — ONDANSETRON HCL 4 MG PO TABS: 4.0000 mg | ORAL_TABLET | Freq: Four times a day (QID) | ORAL | Status: DC | PRN

## 2023-06-15 MED ORDER — HYDROMORPHONE HCL 1 MG/ML IJ SOLN
1.0000 mg | Freq: Once | INTRAMUSCULAR | Status: AC
  Administered 2023-06-15: 1 mg via INTRAVENOUS
  Filled 2023-06-15: qty 1

## 2023-06-15 MED ORDER — ACETAMINOPHEN 650 MG RE SUPP: 650.0000 mg | Freq: Four times a day (QID) | RECTAL | Status: DC | PRN

## 2023-06-15 MED ORDER — ONDANSETRON HCL 4 MG/2ML IJ SOLN: 4.0000 mg | Freq: Four times a day (QID) | INTRAMUSCULAR | Status: DC | PRN

## 2023-06-15 MED ORDER — OXYCODONE HCL 5 MG PO TABS
5.0000 mg | ORAL_TABLET | ORAL | Status: DC | PRN
  Administered 2023-06-15: 5 mg via ORAL
  Filled 2023-06-15: qty 1

## 2023-06-15 MED ORDER — OXYCODONE HCL 5 MG PO TABS
5.0000 mg | ORAL_TABLET | ORAL | Status: DC | PRN
  Administered 2023-06-15 – 2023-06-18 (×11): 10 mg via ORAL
  Filled 2023-06-15 (×11): qty 2

## 2023-06-15 MED ORDER — GADOBUTROL 1 MMOL/ML IV SOLN
10.0000 mL | Freq: Once | INTRAVENOUS | Status: AC | PRN
  Administered 2023-06-15: 10 mL via INTRAVENOUS
  Filled 2023-06-15: qty 10

## 2023-06-15 MED ORDER — HYDROMORPHONE HCL 1 MG/ML IJ SOLN
0.5000 mg | Freq: Four times a day (QID) | INTRAMUSCULAR | Status: DC | PRN
  Administered 2023-06-15 – 2023-06-16 (×2): 0.5 mg via INTRAVENOUS
  Filled 2023-06-15 (×2): qty 0.5

## 2023-06-15 MED ORDER — SENNA 8.6 MG PO TABS
1.0000 | ORAL_TABLET | Freq: Two times a day (BID) | ORAL | Status: DC
  Administered 2023-06-15 – 2023-06-17 (×4): 8.6 mg via ORAL
  Filled 2023-06-15 (×4): qty 1

## 2023-06-15 NOTE — Progress Notes (Signed)
   06/15/23 1628  TOC Brief Assessment  Insurance and Status Reviewed (BCBS COMM PPO)  Patient has primary care physician Yes Bufford Buttner, [place], [name])  Home environment has been reviewed Home with Spouse  Prior level of function: independent  Prior/Current Home Services No current home services  Social Determinants of Health Reivew SDOH reviewed needs interventions  Readmission risk has been reviewed Yes (9%)  Transition of care needs no transition of care needs at this time

## 2023-06-15 NOTE — ED Notes (Signed)
Handoff report given to [facility] 

## 2023-06-15 NOTE — H&P (Signed)
History and Physical    [name]:[id number] DOB: 1976-08-29 DOA: 06/15/2023  I have briefly reviewed the patient's prior medical records in [facility] Link  PCP: Jarold Motto, [name]  Patient coming from: home  Chief Complaint: back pain  HPI: Susan [name] is a 47 y.o. female with medical history significant of HLD, obesity, who comes to the hospital with complaints of back pain.  She tells me that she was vacationing in [place] about couple of weeks ago, slipped while going on a hike and fell.  She felt fine right away, but couple days later she started having severe back pain.  She was able to get a local doctor to come to our hotel room and got to prescription as well as a "shot in her back", but she is not sure what the injections was.  Tells me that she felt better afterwards and was able to tolerate the long flight home.  She denies any fever or chills.  She denies any chest pain, no shortness of breath.  No abdominal pain, no nausea or vomiting.  She is not a smoker, drinks socially couple times a week and denies any IV drug use.  She saw Dr. Antoine Primas for her back pain, and given severity underwent an MRI as an outpatient showed discitis/osteomyelitis at L5-S1 with epidural inflammation as well as a small sacral epidural abscess, and once this resulted she was sent to the ER.    ED Course: In the ED she is afebrile, normotensive, satting well on room air.  Blood work shows a white count of 16.8. EDP discussed with neurosurgery, Dr. Conchita Paris who did not recommend any operative intervention but treatment with antibiotics.  Review of Systems: All systems reviewed, and apart from HPI, all negative  Past Medical History:  Diagnosis Date   Abnormal pap 02/2003   CIN 2-3   Basal cell carcinoma of back 07/22/2021   GERD (gastroesophageal reflux disease)    Hyperlipidemia    Migraine with aura     Past Surgical History:  Procedure Laterality Date   BREAST  BIOPSY Left 08/04/2022   [place] LT BREAST BX W LOC DEV 1ST LESION IMG BX SPEC US GUIDE 08/04/2022 [facility] MAMMOGRAPHY   CERVICAL BIOPSY  W/ LOOP ELECTRODE EXCISION  02/2003   CIN 2-3   CHOLECYSTECTOMY  2015   COSMETIC SURGERY  2009     reports that she has never smoked. She has never used smokeless tobacco. She reports current alcohol use of about 2.0 standard drinks of alcohol per week. She reports that she does not use drugs.  No Known Allergies  Family History  Problem Relation Age of Onset   HIV Mother    Other Mother    Early death Mother        Age 3   Hypertension Father    Depression Father        Suicide   Early death Brother        Age 50   Drug abuse Brother    Hypertension Maternal Grandmother    Diabetes Maternal Grandmother    Stroke Maternal Grandmother    Heart failure Maternal Grandfather    Heart disease Maternal Grandfather    Diabetes Paternal Grandmother    Depression Paternal Grandfather        Suicide   Colon cancer Neg Hx    Colon polyps Neg Hx    Esophageal cancer Neg Hx    Rectal cancer Neg Hx  Stomach cancer Neg Hx     Prior to Admission medications   Medication Sig Start Date End Date Taking? Authorizing Provider  desoximetasone (TOPICORT) 0.25 % cream Use 1 X daily prn. But not long term 07/26/22   Jarold Motto, PA  gabapentin (NEURONTIN) 300 MG capsule Take 1 capsule (300 mg total) by mouth at bedtime. 06/06/23   Judi Saa, DO  HYDROcodone-acetaminophen (NORCO) 7.5-325 MG tablet Take 1 tablet by mouth every 6 (six) hours as needed for moderate pain (cough). 06/11/23   Judi Saa, DO  LORazepam (ATIVAN) 0.5 MG tablet Take 1 tablet (0.5 mg total) by mouth 2 (two) times daily as needed for anxiety. 04/19/22   Jarold Motto, PA  Multiple Vitamins-Minerals (MULTIVITAMIN PO) Take by mouth daily.    [provider]  pantoprazole (PROTONIX) 40 MG tablet TAKE 1 TABLET BY MOUTH EVERY DAY 04/18/23   Jarold Motto, PA   phentermine 37.5 MG capsule Take 1 capsule (37.5 mg total) by mouth every morning. 07/25/22   Jarold Motto, PA  pravastatin (PRAVACHOL) 80 MG tablet Take 1 tablet (80 mg total) by mouth daily. 07/26/22   Jarold Motto, PA  predniSONE (DELTASONE) 20 MG tablet Take 2 tablets (40 mg total) by mouth daily with breakfast. 06/06/23   Judi Saa, DO  TART CHERRY PO Take by mouth.    [provider]  tiZANidine (ZANAFLEX) 4 MG tablet TAKE 1 TABLET BY MOUTH EVERYDAY AT BEDTIME 03/12/23   Judi Saa, DO  traZODone (DESYREL) 100 MG tablet TAKE 1 TABLET BY MOUTH EVERYDAY AT BEDTIME 01/15/23   Jarold Motto, PA  Vitamin D 12.5 MCG/0.25ML LIQD Take by mouth.    [provider]    Physical Exam: Vitals:   06/15/23 1300 06/15/23 1400 06/15/23 1415 06/15/23 1511  BP: 132/72 (!) 125/111 129/73 136/89  Pulse:  (!) 50 76 87  Resp:   16 17  Temp:    98.6 F (37 C)  TempSrc:    Oral  SpO2:  100% 100% 100%  Weight:      Height:       Constitutional: NAD, calm, comfortable Eyes: PERRL, lids and conjunctivae normal ENMT: Mucous membranes are moist.  Neck: normal, supple Respiratory: clear to auscultation bilaterally, no wheezing, no crackles. Normal respiratory effort. Cardiovascular: Regular rate and rhythm, no murmurs / rubs / gallops. No extremity edema.  Abdomen: no tenderness, no masses palpated. Bowel sounds positive.  Musculoskeletal: no clubbing / cyanosis. Normal muscle tone.  Skin: no rashes, lesions, ulcers. No induration Neurologic: Grossly nonfocal Psychiatric: Normal judgment and insight. Alert and oriented x 3. Normal mood.   Labs on Admission: I have personally reviewed following labs and imaging studies  CBC: Recent Labs  Lab 06/15/23 1341  WBC 16.8*  NEUTROABS 12.3*  HGB 10.3*  HCT 31.4*  MCV 86.3  PLT 683*   Basic Metabolic Panel: Recent Labs  Lab 06/15/23 1341  NA 138  K 3.8  CL 99  CO2 26  GLUCOSE 103*  BUN 14  CREATININE 0.78   CALCIUM 9.9   Liver Function Tests: Recent Labs  Lab 06/15/23 1341  AST 47*  ALT 128*  ALKPHOS 277*  BILITOT 0.6  PROT 8.0  ALBUMIN 4.0   Coagulation Profile: No results for input(s): "INR", "PROTIME" in the last 168 hours. BNP (last 3 results) No results for input(s): "PROBNP" in the last 8760 hours. CBG: No results for input(s): "GLUCAP" in the last 168 hours. Thyroid Function Tests: No  results for input(s): "TSH", "T4TOTAL", "FREET4", "T3FREE", "THYROIDAB" in the last 72 hours. Urine analysis:    Component Value Date/Time   BILIRUBINUR neg 01/08/2014 1531   PROTEINUR neg 01/08/2014 1531   UROBILINOGEN negative 01/08/2014 1531   NITRITE neg 01/08/2014 1531   LEUKOCYTESUR Negative 01/08/2014 1531     Radiological Exams on Admission: MR Lumbar Spine W Wo Contrast  Addendum Date: 06/15/2023   ADDENDUM REPORT: 06/15/2023 09:07 ADDENDUM: Study discussed by telephone with Dr. Antoine Primas on 06/15/2023 at 0846 hours. Electronically Signed   By: Odessa Fleming M.D.   On: 06/15/2023 09:07   Result Date: 06/15/2023 CLINICAL DATA:  47 year old female with fall in [place], persistent back pain, abnormal L5-S1 disc space on pelvis MRI recently. EXAM: MRI LUMBAR SPINE WITHOUT AND WITH CONTRAST TECHNIQUE: Multiplanar and multiecho pulse sequences of the lumbar spine were obtained without and with intravenous contrast. CONTRAST:  10mL GADAVIST GADOBUTROL 1 MMOL/ML IV SOLN COMPARISON:  Pelvis MRI 06/07/2023. FINDINGS: Segmentation: Lumbar segmentation appears to be normal and will be designated as such for this report. Alignment:  Maintained lumbar lordosis.  No spondylolisthesis. Vertebrae: Abnormal marrow signal at the L5 and S1 vertebral bodies, tracking into the left S1 sacral ala also (series 9, image 38). Patchy and irregular marrow edema and enhancement there surrounding abnormal disc space which is T2 and STIR hyperintense (series 7 image 9) and evidence of circumferential paraspinal soft  tissue inflammation, presacral inflammation there (series 4, image 9). Faint L5 inferior endplate erosion is possible on series 7, image 10. There is epidural space involvement, further detailed below. The posterior elements are relatively spared. No other marrow edema or evidence of acute osseous abnormality. Conus medullaris and cauda equina: Conus extends to the L2 level. No lower spinal cord or conus signal abnormality. Normal cauda equina nerve roots from the conus to the L4-L5 disc space level. Abnormal epidural and dural thickening and enhancement beginning at L5 and continuing into the sacrum, more pronounced ventrally but circumferential. And there is an associated epidural space abscess tracking from the right lateral epidural space at L1 (series 9, image 38) in the posterior and lateral sacral spinal canal to at least the S2-S3 level (series 4, image 9 and series 5 image 44. Direct involvement of the exiting right sacral nerve roots. Inflammation in the bilateral L5 and S1 neural foramina greater on the left. See additional details below. Paraspinal and other soft tissues: Ventral and lateral more so than posterior paraspinal soft tissue inflammation centered at L5-S1. No direct involvement of the psoas muscles. Minimal posterior erector spinae muscle involvement. No intramuscular abscess. Negative visible abdominal viscera. Disc levels: Negative from the visible lower thoracic spine through L3-L4. L4-L5: Disc desiccation. Small posterior disc bulge and/or annular fissure on series 5, image 30. Mild facet hypertrophy. No stenosis. L5-S1: Discitis osteomyelitis with disc space loss. Indeterminate bilateral L5-S1 facet joint fluid. No obvious facet marrow edema at this time. Pronounced generalized epidural space inflammation and phlegmon, especially in the ventral epidural space (series 6, image 37). Small but discrete epidural abscess beginning in the posterolateral epidural space of S1 and continuing  through the right posterior sacral spinal canal. Mild to moderate spinal stenosis at L5-S1. Confluent bilateral L5 foraminal inflammation as well as mild to moderate multifactorial foraminal stenosis. IMPRESSION: 1. Positive for Discitis Osteomyelitis at L5-S1. Pronounced epidural inflammation there, and small sacral epidural abscess tracking within the right posterior and lateral sacral epidural space from S1 through at least S3, involving the exiting  right sacral nerve roots. Subsequent mild to moderate L5-S1 spinal stenosis, in part due to dural thickening and enhancement. And bilateral L5 neural foraminal stenosis and foraminal inflammation. 2. No paraspinal muscle abscess.  No other lumbar levels involved. Electronically Signed: By: Odessa Fleming M.D. On: 06/15/2023 08:34    Assessment/Plan Principal problem L5-S1 discitis/osteomyelitis -she has elevated white count but afebrile and normotensive, nontoxic-appearing.  Unclear source as no recent surgeries, dental work or any other illnesses. Hold off antibiotics.  IR consulted, discussed with Brayton El, PA, I would prefer, if possible, to do the biopsy over the weekend and not wait until Monday  -Messaged Dr Drue Second with ID as well for the patient to be seen on Saturday -Blood culture sent in the ER, monitor  Active problems Elevated LFTs -obtain right upper quadrant ultrasound, suspect a degree of fatty liver disease  Obesity, class I -based on a BMI of 31.6.  She was recently on phentermine but no longer taking it.  Hyperlipidemia -continue home statin  GERD -continue PPI use  Anxiety with flying -not taking lorazepam on a regular basis   DVT prophylaxis: SCDs  Code Status: Full code  Family Communication: no family at bedside  Disposition Plan: home when ready  Bed Type: MedSurg Consults called: ID, IR, Neurosurgery   Obs/Inp: Inpatient  At the time of admission, it appears that the appropriate admission status for this patient is  INPATIENT as it is expected that patient will require hospital care > 2 midnights. This is judged to be reasonable and necessary in order to provide the required intensity of service to ensure the patient's safety given: presenting symptoms, initial radiographic and laboratory data and in the context of their chronic comorbidities. Together, these circumstances are felt to place patient at high at high risk for further clinical deterioration threatening life, limb, or organ.  Pamella Pert, MD, PhD [facility]  Contact via [email or web address]  06/15/2023, 3:28 PM

## 2023-06-15 NOTE — ED Triage Notes (Signed)
Pt arrived POV from home c/o lower back pain radiating slightly to the left side that has been worsening for approx 2.5 weeks since falling on her backside when hiking on a trip. Pt reports she had MRI x2 and was told by the doctor to go to ED after seeing results from MRI.   Last took 800 mg Ibuprofen at approx 0600

## 2023-06-15 NOTE — ED Notes (Signed)
Tarin with cl called for transport

## 2023-06-15 NOTE — ED Provider Notes (Signed)
[place] EMERGENCY DEPARTMENT AT [facility] Provider Note   CSN: [id number] Arrival date & time: 06/15/23  1229     History  Chief Complaint  Patient presents with   Back Pain    Susan [name] is a 47 y.o. female history of a send aortic dilation presented after an abnormal MRI.  Patient was in [place] 2 and half weeks ago and fell on her butt however since has been having sacral pain and low back pain.  Patient went to go see her primary care provider and had an x-ray of the pelvis that shows tailbone fracture however an MRI was done that showed possible herniated disc and so they repeated the MRI and found a bag infection and so she was referred to the ED.  Patient denies nausea vomiting, fevers, abdominal pain, urinary/bowel incontinence, saddle anesthesia, new onset weakness, paresthesias.  Home Medications Prior to Admission medications   Medication Sig Start Date End Date Taking? Authorizing Provider  desoximetasone (TOPICORT) 0.25 % cream Use 1 X daily prn. But not long term 07/26/22   Jarold Motto, PA  gabapentin (NEURONTIN) 300 MG capsule Take 1 capsule (300 mg total) by mouth at bedtime. 06/06/23   Judi Saa, DO  HYDROcodone-acetaminophen (NORCO) 7.5-325 MG tablet Take 1 tablet by mouth every 6 (six) hours as needed for moderate pain (cough). 06/11/23   Judi Saa, DO  LORazepam (ATIVAN) 0.5 MG tablet Take 1 tablet (0.5 mg total) by mouth 2 (two) times daily as needed for anxiety. 04/19/22   Jarold Motto, PA  Multiple Vitamins-Minerals (MULTIVITAMIN PO) Take by mouth daily.    [provider]  pantoprazole (PROTONIX) 40 MG tablet TAKE 1 TABLET BY MOUTH EVERY DAY 04/18/23   Jarold Motto, PA  phentermine 37.5 MG capsule Take 1 capsule (37.5 mg total) by mouth every morning. 07/25/22   Jarold Motto, PA  pravastatin (PRAVACHOL) 80 MG tablet Take 1 tablet (80 mg total) by mouth daily. 07/26/22   Jarold Motto, PA  predniSONE  (DELTASONE) 20 MG tablet Take 2 tablets (40 mg total) by mouth daily with breakfast. 06/06/23   Judi Saa, DO  TART CHERRY PO Take by mouth.    [provider]  tiZANidine (ZANAFLEX) 4 MG tablet TAKE 1 TABLET BY MOUTH EVERYDAY AT BEDTIME 03/12/23   Judi Saa, DO  traZODone (DESYREL) 100 MG tablet TAKE 1 TABLET BY MOUTH EVERYDAY AT BEDTIME 01/15/23   Jarold Motto, PA  Vitamin D 12.5 MCG/0.25ML LIQD Take by mouth.    [provider]      Allergies    Patient has no known allergies.    Review of Systems   Review of Systems  Musculoskeletal:  Positive for back pain.    Physical Exam Updated Vital Signs BP 129/73 (BP Location: Right Arm)   Pulse 76   Temp 98.3 F (36.8 C) (Oral)   Resp 16   Ht 5\' 8"  (1.727 m)   Wt 94.3 kg   LMP 07/03/2022 (Exact Date)   SpO2 100%   BMI 31.63 kg/m  Physical Exam Constitutional:      General: She is in acute distress.  Cardiovascular:     Rate and Rhythm: Normal rate and regular rhythm.     Pulses: Normal pulses.     Heart sounds: Normal heart sounds.  Pulmonary:     Effort: Pulmonary effort is normal. No respiratory distress.     Breath sounds: Normal breath sounds.  Abdominal:  Palpations: Abdomen is soft.     Tenderness: There is no abdominal tenderness. There is no guarding or rebound.  Musculoskeletal:     Comments: 5 out of 5 bilateral knee extension/flexion No midline tenderness or abnormalities  Skin:    General: Skin is warm and dry.  Neurological:     General: No focal deficit present.     Mental Status: She is alert.     Comments: 2+ bilateral patellar reflexes Sensation intact distally Able to ambulate however this does exacerbate back pain  Psychiatric:        Mood and Affect: Mood normal.     ED Results / Procedures / Treatments   Labs (all labs ordered are listed, but only abnormal results are displayed) Labs Reviewed  CBC WITH DIFFERENTIAL/PLATELET - Abnormal; Notable for the  following components:      Result Value   WBC 16.8 (*)    RBC 3.64 (*)    Hemoglobin 10.3 (*)    HCT 31.4 (*)    Platelets 683 (*)    Neutro Abs 12.3 (*)    Abs Immature Granulocytes 0.28 (*)    All other components within normal limits  CULTURE, BLOOD (ROUTINE X 2)  CULTURE, BLOOD (ROUTINE X 2)  COMPREHENSIVE METABOLIC PANEL  SEDIMENTATION RATE  C-REACTIVE PROTEIN    EKG None  Radiology MR Lumbar Spine W Wo Contrast  Addendum Date: 06/15/2023   ADDENDUM REPORT: 06/15/2023 09:07 ADDENDUM: Study discussed by telephone with Dr. Antoine Primas on 06/15/2023 at 0846 hours. Electronically Signed   By: Odessa Fleming M.D.   On: 06/15/2023 09:07   Result Date: 06/15/2023 CLINICAL DATA:  47 year old female with fall in [place], persistent back pain, abnormal L5-S1 disc space on pelvis MRI recently. EXAM: MRI LUMBAR SPINE WITHOUT AND WITH CONTRAST TECHNIQUE: Multiplanar and multiecho pulse sequences of the lumbar spine were obtained without and with intravenous contrast. CONTRAST:  10mL GADAVIST GADOBUTROL 1 MMOL/ML IV SOLN COMPARISON:  Pelvis MRI 06/07/2023. FINDINGS: Segmentation: Lumbar segmentation appears to be normal and will be designated as such for this report. Alignment:  Maintained lumbar lordosis.  No spondylolisthesis. Vertebrae: Abnormal marrow signal at the L5 and S1 vertebral bodies, tracking into the left S1 sacral ala also (series 9, image 38). Patchy and irregular marrow edema and enhancement there surrounding abnormal disc space which is T2 and STIR hyperintense (series 7 image 9) and evidence of circumferential paraspinal soft tissue inflammation, presacral inflammation there (series 4, image 9). Faint L5 inferior endplate erosion is possible on series 7, image 10. There is epidural space involvement, further detailed below. The posterior elements are relatively spared. No other marrow edema or evidence of acute osseous abnormality. Conus medullaris and cauda equina: Conus extends to the  L2 level. No lower spinal cord or conus signal abnormality. Normal cauda equina nerve roots from the conus to the L4-L5 disc space level. Abnormal epidural and dural thickening and enhancement beginning at L5 and continuing into the sacrum, more pronounced ventrally but circumferential. And there is an associated epidural space abscess tracking from the right lateral epidural space at L1 (series 9, image 38) in the posterior and lateral sacral spinal canal to at least the S2-S3 level (series 4, image 9 and series 5 image 44. Direct involvement of the exiting right sacral nerve roots. Inflammation in the bilateral L5 and S1 neural foramina greater on the left. See additional details below. Paraspinal and other soft tissues: Ventral and lateral more so than posterior paraspinal soft tissue  inflammation centered at L5-S1. No direct involvement of the psoas muscles. Minimal posterior erector spinae muscle involvement. No intramuscular abscess. Negative visible abdominal viscera. Disc levels: Negative from the visible lower thoracic spine through L3-L4. L4-L5: Disc desiccation. Small posterior disc bulge and/or annular fissure on series 5, image 30. Mild facet hypertrophy. No stenosis. L5-S1: Discitis osteomyelitis with disc space loss. Indeterminate bilateral L5-S1 facet joint fluid. No obvious facet marrow edema at this time. Pronounced generalized epidural space inflammation and phlegmon, especially in the ventral epidural space (series 6, image 37). Small but discrete epidural abscess beginning in the posterolateral epidural space of S1 and continuing through the right posterior sacral spinal canal. Mild to moderate spinal stenosis at L5-S1. Confluent bilateral L5 foraminal inflammation as well as mild to moderate multifactorial foraminal stenosis. IMPRESSION: 1. Positive for Discitis Osteomyelitis at L5-S1. Pronounced epidural inflammation there, and small sacral epidural abscess tracking within the right posterior  and lateral sacral epidural space from S1 through at least S3, involving the exiting right sacral nerve roots. Subsequent mild to moderate L5-S1 spinal stenosis, in part due to dural thickening and enhancement. And bilateral L5 neural foraminal stenosis and foraminal inflammation. 2. No paraspinal muscle abscess.  No other lumbar levels involved. Electronically Signed: By: Odessa Fleming M.D. On: 06/15/2023 08:34    Procedures Procedures    Medications Ordered in ED Medications  HYDROmorphone (DILAUDID) injection 1 mg (1 mg Intravenous Given 06/15/23 1337)    ED Course/ Medical Decision Making/ A&P                                 Medical Decision Making Amount and/or Complexity of Data Reviewed Labs: ordered.  Risk Prescription drug management. Decision regarding hospitalization.   Windy Kalata 47 y.o. presented today for back pain. Working DDx that I considered at this time includes, but not limited to, MSK, underlying fracture, epidural hematoma/abscess, cauda equina syndrome, spinal stenosis, spinal malignancy, discitis, spinal infection, spondylitises/ spondylosis, conus medullaris, DDD of the back.  R/o DDx: MSK, underlying fracture, cauda equina syndrome, spinal stenosis, spinal malignancy, spondylitises/ spondylosis, conus medullaris, DDD of the back : less likely due to history of present illness, physical exam, labs/imaging findings.  Review of prior external notes: 06/07/2023 ED  Unique Tests and My Interpretation:  CBC: Leukocytosis 16.8 ESR: Pending CRP: Pending CMP: Pending Blood cultures: Pending  Discussion with Independent Historian:  Husband  Discussion of Management of Tests:  Conchita Paris, MD Neurosurgery ; Elvera Lennox, MD Hospitalist  Risk: High: hospitalization or escalation of hospital-level care  Risk Stratification Score: None  Plan: On exam patient was uncomfortable on exam however had stable vitals.  Patient does have 2+ bilateral patellar reflexes  and no abdominal tenderness.  No midline tenderness or abnormalities noted.  Patient was able to walk into the room however did appear very uncomfortable while walking.  Patient not endorsing saddle anesthesia or urinary/bowel incontinence, paresthesias or new onset weakness.  Outpatient MRI does show discitis at L5-S1 along with possible small sacral epidural abscess.  I spoke to the neurosurgeon on-call and he states that patient needs medicine admission for IV antibiotics along with blood cultures and to reach out to IR if they need a disc sample if the blood cultures are negative.  At this time neurosurgery is not concerned about the reported small sacral epidural abscess and states that they do not need surgery at this time and just medicine.  Will  give Dilaudid and draw labs and reach out to the hospitalist for admission.  I spoke to the hospitalist and patient was accepted for admission.  At hospitalist request added ESR and CRP.  Patient stable to be transferred.  This chart was dictated using voice recognition software.  Despite best efforts to proofread,  errors can occur which can change the documentation meaning.         Final Clinical Impression(s) / ED Diagnoses Final diagnoses:  Discitis of lumbosacral region    Rx / DC Orders ED Discharge Orders     None         Remi Deter 06/15/23 1423    Virgina Norfolk, DO 06/15/23 1500

## 2023-06-16 ENCOUNTER — Inpatient Hospital Stay (HOSPITAL_COMMUNITY): Payer: BC Managed Care – PPO

## 2023-06-16 DIAGNOSIS — K219 Gastro-esophageal reflux disease without esophagitis: Secondary | ICD-10-CM | POA: Diagnosis not present

## 2023-06-16 DIAGNOSIS — G062 Extradural and subdural abscess, unspecified: Secondary | ICD-10-CM

## 2023-06-16 DIAGNOSIS — R7881 Bacteremia: Secondary | ICD-10-CM

## 2023-06-16 DIAGNOSIS — A4901 Methicillin susceptible Staphylococcus aureus infection, unspecified site: Secondary | ICD-10-CM

## 2023-06-16 DIAGNOSIS — B9561 Methicillin susceptible Staphylococcus aureus infection as the cause of diseases classified elsewhere: Secondary | ICD-10-CM | POA: Diagnosis not present

## 2023-06-16 DIAGNOSIS — R7401 Elevation of levels of liver transaminase levels: Secondary | ICD-10-CM | POA: Diagnosis not present

## 2023-06-16 DIAGNOSIS — M4647 Discitis, unspecified, lumbosacral region: Secondary | ICD-10-CM

## 2023-06-16 LAB — BLOOD CULTURE ID PANEL (REFLEXED) - BCID2

## 2023-06-16 LAB — COMPREHENSIVE METABOLIC PANEL
ALT: 148 U/L — ABNORMAL HIGH (ref 0–44)
AST: 136 U/L — ABNORMAL HIGH (ref 15–41)
Albumin: 2.7 g/dL — ABNORMAL LOW (ref 3.5–5.0)
Alkaline Phosphatase: 323 U/L — ABNORMAL HIGH (ref 38–126)
Anion gap: 13 (ref 5–15)
BUN: 12 mg/dL (ref 6–20)
CO2: 25 mmol/L (ref 22–32)
Calcium: 9.3 mg/dL (ref 8.9–10.3)
Chloride: 99 mmol/L (ref 98–111)
Creatinine, Ser: 0.92 mg/dL (ref 0.44–1.00)
GFR, Estimated: >60 mL/min (ref >60)
Glucose, Bld: 123 mg/dL — ABNORMAL HIGH (ref 70–99)
Potassium: 4.2 mmol/L (ref 3.5–5.1)
Sodium: 137 mmol/L (ref 135–145)
Total Bilirubin: 1 mg/dL (ref 0.3–1.2)
Total Protein: 7.1 g/dL (ref 6.5–8.1)

## 2023-06-16 LAB — CBC
HCT: 32.1 % — ABNORMAL LOW (ref 36.0–46.0)
Hemoglobin: 10.3 g/dL — ABNORMAL LOW (ref 12.0–15.0)
MCH: 28.8 pg (ref 26.0–34.0)
MCHC: 32.1 g/dL (ref 30.0–36.0)
MCV: 89.7 fL (ref 80.0–100.0)
Platelets: 691 10*3/uL — ABNORMAL HIGH (ref 150–400)
RBC: 3.58 MIL/uL — ABNORMAL LOW (ref 3.87–5.11)
RDW: 14.6 % (ref 11.5–15.5)
WBC: 14.8 10*3/uL — ABNORMAL HIGH (ref 4.0–10.5)
nRBC: 0 % (ref 0.0–0.2)

## 2023-06-16 LAB — HEPATITIS PANEL, ACUTE
HCV Ab: NONREACTIVE
Hep A IgM: NONREACTIVE
Hep B C IgM: NONREACTIVE
Hepatitis B Surface Ag: NONREACTIVE

## 2023-06-16 LAB — ECHOCARDIOGRAM COMPLETE
AR max vel: 2.43 cm2
AV Area VTI: 2.62 cm2
AV Area mean vel: 2.42 cm2
AV Mean grad: 8 mm[Hg]
AV Peak grad: 14.6 mm[Hg]
Ao pk vel: 1.91 m/s
Area-P 1/2: 3.54 cm2
Height: 68 in
S' Lateral: 2.5 cm
Weight: 3328 [oz_av]

## 2023-06-16 LAB — MAGNESIUM: Magnesium: 2.2 mg/dL (ref 1.7–2.4)

## 2023-06-16 LAB — HIV ANTIBODY (ROUTINE TESTING W REFLEX): HIV Screen 4th Generation wRfx: NONREACTIVE

## 2023-06-16 MED ORDER — ENOXAPARIN SODIUM 40 MG/0.4ML IJ SOSY
40.0000 mg | PREFILLED_SYRINGE | INTRAMUSCULAR | Status: DC
  Administered 2023-06-17 – 2023-06-19 (×3): 40 mg via SUBCUTANEOUS
  Filled 2023-06-16 (×3): qty 0.4

## 2023-06-16 MED ORDER — HYDROMORPHONE HCL 1 MG/ML IJ SOLN
0.5000 mg | INTRAMUSCULAR | Status: DC | PRN
  Administered 2023-06-16 – 2023-06-20 (×13): 0.5 mg via INTRAVENOUS
  Filled 2023-06-16 (×15): qty 0.5

## 2023-06-16 MED ORDER — CEFAZOLIN SODIUM-DEXTROSE 2-4 GM/100ML-% IV SOLN
2.0000 g | Freq: Three times a day (TID) | INTRAVENOUS | Status: DC
  Administered 2023-06-16 – 2023-06-20 (×14): 2 g via INTRAVENOUS
  Filled 2023-06-16 (×14): qty 100

## 2023-06-16 MED ORDER — GADOBUTROL 1 MMOL/ML IV SOLN
9.0000 mL | Freq: Once | INTRAVENOUS | Status: AC | PRN
  Administered 2023-06-16: 9 mL via INTRAVENOUS

## 2023-06-16 MED ORDER — SODIUM CHLORIDE 0.9 % IV SOLN: 2.0000 g | Freq: Three times a day (TID) | INTRAVENOUS | Status: DC

## 2023-06-16 NOTE — Consult Note (Signed)
Date of Admission:  06/15/2023          Reason for Consult:  Discitis osteomyelitis L5-S1 with epidural abscess also now with MSSA bacteremia    Referring Provider: Caryl Comes, MD   Assessment:  MSSA bacteremia Discitis vertebral osteomyelitis L5-S1 with epidural abscess Upper back pain concerning for infection potentially in the T-spine or paraspinous muscles Hyperlipidemia Bicuspid aortic valve AAA  Plan:  Start cefazolin 2D echocardiogram MRI thoracic spine Repeat blood cultures in the morning Not placed central lines until we have cleared her bacteremia which may take another 5 days or so He is going to need a TEE Will plan ultimately on 6 to 8 weeks of IV cefazolin Would reimage lumbar spine with MRI to ensure the epidural abscess is diminishing in size with therapy  Principal Problem:   Discitis   Scheduled Meds:  enoxaparin (LOVENOX) injection  40 mg Subcutaneous Q24H   gabapentin  300 mg Oral QHS   pantoprazole  40 mg Oral Daily   senna  1 tablet Oral BID   traZODone  100 mg Oral QHS   Continuous Infusions:   ceFAZolin (ANCEF) IV 2 g (06/16/23 1004)   PRN Meds:.acetaminophen **OR** acetaminophen, HYDROmorphone (DILAUDID) injection, ondansetron **OR** ondansetron (ZOFRAN) IV, oxyCODONE  HPI: Susan [name] is a 47 y.o. female with past medical history significant for hyperlipidemia obesity who has been suffering from low back pain over the last several weeks.  She had apparently slipped while going for a hike in [place].  Back pain then came on.  She has been worked up by sports medicine including Terrilee Files.  Ultimately MRI of the spine was performed which showed discitis and osteomyelitis at L5-S1 with a small sacral epidural abscess tracking from the right posterior lateral sacral epidural space from S1-S3 with mild to moderate L5-S1 spinal stenosis and bilateral L5 neural foraminal stenosis and inflammation  She was seen at [facility] and blood cultures were taken which have subsequent turned positive for MSSA initially in 1 but now BOTH sets of blood cultures (note the GS Morrie Sheldon mistakenly read as showing gram-negative rods when it shows gram-positive cocci in clusters)  Was going to have IR guided biopsy performed for culture but I think this is no longer necessary now that we have of 2 sites positive for MSSA it appears  I have personally spent 82 minutes involved in face-to-face and non-face-to-face activities for this patient on the day of the visit. Professional time spent includes the following activities: Preparing to see the patient (review of tests), Obtaining and/or reviewing separately obtained history (admission/discharge record), Performing a medically appropriate examination and/or evaluation , Ordering medications/tests/procedures, referring and communicating with other health care professionals, Documenting clinical information in the EMR, Independently interpreting results (not separately reported), Communicating results to the patient/family/caregiver, Counseling and educating the patient/family/caregiver and Care coordination (not separately reported).     Review of Systems: Review of Systems  Constitutional:  Negative for chills, fever, malaise/fatigue and weight loss.  HENT:  Negative for congestion and sore throat.   Eyes:  Negative for blurred vision and photophobia.  Respiratory:  Negative for cough, shortness of breath and wheezing.   Cardiovascular:  Negative for chest pain, palpitations and leg swelling.  Gastrointestinal:  Negative for abdominal pain, blood in stool, constipation, diarrhea, heartburn, melena, nausea and vomiting.  Genitourinary:  Negative for dysuria, flank pain and hematuria.  Musculoskeletal:  Positive for back pain. Negative for  falls, joint pain and myalgias.  Skin:  Negative for itching and rash.  Neurological:  Negative for dizziness, focal weakness, loss of  consciousness, weakness and headaches.  Endo/Heme/Allergies:  Does not bruise/bleed easily.  Psychiatric/Behavioral:  Negative for depression and suicidal ideas. The patient does not have insomnia.     Past Medical History:  Diagnosis Date   Abnormal pap 02/2003   CIN 2-3   Basal cell carcinoma of back 07/22/2021   GERD (gastroesophageal reflux disease)    Hyperlipidemia    Migraine with aura     Social History   Tobacco Use   Smoking status: Never   Smokeless tobacco: Never  Vaping Use   Vaping status: Never Used  Substance Use Topics   Alcohol use: Yes    Alcohol/week: 2.0 standard drinks of alcohol    Types: 2 Glasses of wine per week    Comment: 1-2 glasses/week   Drug use: No    Family History  Problem Relation Age of Onset   HIV Mother    Other Mother    Early death Mother        Age 39   Hypertension Father    Depression Father        Suicide   Early death Brother        Age 66   Drug abuse Brother    Hypertension Maternal Grandmother    Diabetes Maternal Grandmother    Stroke Maternal Grandmother    Heart failure Maternal Grandfather    Heart disease Maternal Grandfather    Diabetes Paternal Grandmother    Depression Paternal Grandfather        Suicide   Colon cancer Neg Hx    Colon polyps Neg Hx    Esophageal cancer Neg Hx    Rectal cancer Neg Hx    Stomach cancer Neg Hx    No Known Allergies  OBJECTIVE: Blood pressure (!) 146/92, pulse 90, temperature 98 F (36.7 C), temperature source Oral, resp. rate 16, height 5\' 8"  (1.727 m), weight 94.3 kg, last menstrual period 07/03/2022, SpO2 98%.  Physical Exam Constitutional:      General: She is not in acute distress.    Appearance: Normal appearance. She is well-developed. She is not ill-appearing or diaphoretic.  HENT:     Head: Normocephalic and atraumatic.     Right Ear: Hearing and external ear normal.     Left Ear: Hearing and external ear normal.     Nose: No nasal deformity or  rhinorrhea.  Eyes:     General: No scleral icterus.    Conjunctiva/sclera: Conjunctivae normal.     Right eye: Right conjunctiva is not injected.     Left eye: Left conjunctiva is not injected.     Pupils: Pupils are equal, round, and reactive to light.  Neck:     Vascular: No JVD.  Cardiovascular:     Rate and Rhythm: Normal rate and regular rhythm.     Heart sounds: Normal heart sounds, S1 normal and S2 normal. No murmur heard.    No friction rub.  Pulmonary:     Effort: No respiratory distress.     Breath sounds: No stridor. No wheezing, rhonchi or rales.  Abdominal:     General: Bowel sounds are normal. There is no distension.     Palpations: Abdomen is soft.  Musculoskeletal:     Right shoulder: Normal.     Left shoulder: Normal.     Cervical back: Normal range of motion  and neck supple.     Right hip: Normal.     Left hip: Normal.     Right knee: Normal.     Left knee: Normal.  Lymphadenopathy:     Head:     Right side of head: No submandibular, preauricular or posterior auricular adenopathy.     Left side of head: No submandibular, preauricular or posterior auricular adenopathy.     Cervical: No cervical adenopathy.     Right cervical: No superficial or deep cervical adenopathy.    Left cervical: No superficial or deep cervical adenopathy.  Skin:    General: Skin is warm and dry.     Coloration: Skin is not pale.     Findings: No abrasion, bruising, ecchymosis, erythema, lesion or rash.     Nails: There is no clubbing.  Neurological:     General: No focal deficit present.     Mental Status: She is alert and oriented to person, place, and time.     Sensory: No sensory deficit.     Coordination: Coordination normal.     Gait: Gait normal.  Psychiatric:        Attention and Perception: She is attentive.        Mood and Affect: Mood normal.        Speech: Speech normal.        Behavior: Behavior normal. Behavior is cooperative.        Thought Content: Thought  content normal.        Judgment: Judgment normal.   She has tenderness to palpation in her paraspinous area in the thoracic spine  Lab Results Lab Results  Component Value Date   WBC 14.8 (H) 06/16/2023   HGB 10.3 (L) 06/16/2023   HCT 32.1 (L) 06/16/2023   MCV 89.7 06/16/2023   PLT 691 (H) 06/16/2023    Lab Results  Component Value Date   CREATININE 0.92 06/16/2023   BUN 12 06/16/2023   NA 137 06/16/2023   K 4.2 06/16/2023   CL 99 06/16/2023   CO2 25 06/16/2023    Lab Results  Component Value Date   ALT 148 (H) 06/16/2023   AST 136 (H) 06/16/2023   ALKPHOS 323 (H) 06/16/2023   BILITOT 1.0 06/16/2023     Microbiology: Recent Results (from the past 240 hour(s))  Culture, blood (routine x 2)     Status: None (Preliminary result)   Collection Time: 06/15/23  1:40 PM   Specimen: BLOOD  Result Value Ref Range Status   Specimen Description   Final    BLOOD BLOOD LEFT ARM Performed at [facility], [address], [place], [place] [address]    Special Requests   Final    BOTTLES DRAWN AEROBIC AND ANAEROBIC Blood Culture adequate volume Performed at [facility], [address], [place], [place] [address]    Culture  Setup Time   Final    CORRECTED RESULTS GRAM POSITIVE COCCI AEROBIC BOTTLE ONLY Organism ID to follow PREVIOUSLY REPORTED AS: GRAM NEGATIVE RODS CORRECTED RESULTS CALLED TO: Gypsy Lore AMEND [id number] BY Berline Chough, [place] Performed at [facility], [address] [place], [place] [address]    Culture [facility] POSITIVE COCCI  Final   Report Status PENDING  Incomplete  Blood Culture ID Panel (Reflexed)     Status: Abnormal   Collection Time: 06/15/23  1:40 PM  Result Value Ref Range Status   Enterococcus faecalis NOT DETECTED NOT DETECTED Final   Enterococcus  Faecium NOT DETECTED NOT DETECTED Final   Listeria monocytogenes NOT DETECTED NOT DETECTED Final   Staphylococcus species DETECTED (A) NOT DETECTED  Final    Comment: CRITICAL RESULT CALLED TO, READ BACK BY AND VERIFIED WITH: PHARMD CARON AMEND [id number] BY  J RAZZAK, MT    Staphylococcus aureus (BCID) DETECTED (A) NOT DETECTED Final    Comment: CRITICAL RESULT CALLED TO, READ BACK BY AND VERIFIED WITH: PHARMD CARON AMEND [id number] BY J RAZZAK, MT    Staphylococcus epidermidis NOT DETECTED NOT DETECTED Final   Staphylococcus lugdunensis NOT DETECTED NOT DETECTED Final   Streptococcus species NOT DETECTED NOT DETECTED Final   Streptococcus agalactiae NOT DETECTED NOT DETECTED Final   Streptococcus pneumoniae NOT DETECTED NOT DETECTED Final   Streptococcus pyogenes NOT DETECTED NOT DETECTED Final   A.calcoaceticus-baumannii NOT DETECTED NOT DETECTED Final   Bacteroides fragilis NOT DETECTED NOT DETECTED Final   Enterobacterales NOT DETECTED NOT DETECTED Final   Enterobacter cloacae complex NOT DETECTED NOT DETECTED Final   Escherichia coli NOT DETECTED NOT DETECTED Final   Klebsiella aerogenes NOT DETECTED NOT DETECTED Final   Klebsiella oxytoca NOT DETECTED NOT DETECTED Final   Klebsiella pneumoniae NOT DETECTED NOT DETECTED Final   Proteus species NOT DETECTED NOT DETECTED Final   Salmonella species NOT DETECTED NOT DETECTED Final   Serratia marcescens NOT DETECTED NOT DETECTED Final   Haemophilus influenzae NOT DETECTED NOT DETECTED Final   Neisseria meningitidis NOT DETECTED NOT DETECTED Final   Pseudomonas aeruginosa NOT DETECTED NOT DETECTED Final   Stenotrophomonas maltophilia NOT DETECTED NOT DETECTED Final   Candida albicans NOT DETECTED NOT DETECTED Final   Candida auris NOT DETECTED NOT DETECTED Final   Candida glabrata NOT DETECTED NOT DETECTED Final   Candida krusei NOT DETECTED NOT DETECTED Final   Candida parapsilosis NOT DETECTED NOT DETECTED Final   Candida tropicalis NOT DETECTED NOT DETECTED Final   Cryptococcus neoformans/gattii NOT DETECTED NOT DETECTED Final   Meth resistant mecA/C and MREJ NOT  DETECTED NOT DETECTED Final    Comment: Performed at [facility], [address] [place], [place] [address]  Culture, blood (routine x 2)     Status: None (Preliminary result)   Collection Time: 06/15/23  1:41 PM   Specimen: BLOOD RIGHT ARM  Result Value Ref Range Status   Specimen Description   Final    BLOOD RIGHT ARM Performed at [facility], [address], [place], [place] [address]    Special Requests   Final    BOTTLES DRAWN AEROBIC AND ANAEROBIC Blood Culture adequate volume Performed at [facility], [address] [place], [place] [address]    Culture  Setup Time   Final    GRAM POSITIVE COCCI AEROBIC BOTTLE ONLY CRITICAL VALUE NOTED.  VALUE IS CONSISTENT WITH PREVIOUSLY REPORTED AND CALLED VALUE. Performed at [facility], [address], [place], [place] [address]    Culture [facility] POSITIVE COCCI  Final   Report Status PENDING  Incomplete    Acey Lav, MD [facility] [phone] pager  06/16/2023, 12:36 PM

## 2023-06-16 NOTE — Progress Notes (Signed)
PHARMACY ANTIBIOTIC CONSULT NOTE   Susan [name] a 47 y.o. female presenting with back pain. Patient had a shot in the back from a physician in [place] (for back pain). MRI findings c/w lumbar discitis/osteo. Pharmacy has been consulted for cefepime dosing. Blood cultures are currently growing GNRs without speciation to this point.    9/28: Scr 0.92, WBC 14.8   Estimated Creatinine Clearance: 89.8 mL/min (by C-G formula based on SCr of 0.92 mg/dL).  Plan: START Cefepime 2g IV Q8H Monitor renal function, clinical status, C/S, de-escalation   Allergies:  No Known Allergies  Filed Weights   06/15/23 1249  Weight: 94.3 kg (208 lb)       Latest Ref Rng & Units 06/16/2023    6:12 AM 06/15/2023    1:41 PM 07/25/2022   11:21 AM  CBC  WBC 4.0 - 10.5 K/uL 14.8  16.8  8.5   Hemoglobin 12.0 - 15.0 g/dL 40.9  81.1  91.4   Hematocrit 36.0 - 46.0 % 32.1  31.4  36.9   Platelets 150 - 400 K/uL 691  683  339.0     Antibiotics Given (last 72 hours)     None       Antimicrobials this admission: Cefepime 9/28>>c   Microbiology results: 9/27 Bcx: GNRs   Thank you for allowing pharmacy to be a part of this patient's care.  Jani Gravel, PharmD Clinical Pharmacist  06/16/2023 9:07 AM

## 2023-06-16 NOTE — Progress Notes (Signed)
PROGRESS NOTE        PATIENT DETAILS Name: Susan [name] Age: 47 y.o. Sex: female Date of Birth: 1976-09-01 Admit Date: 06/15/2023 Admitting Physician Costin Otelia Sergeant, MD UJW:[id number], Lelon Mast, [name]  Brief Summary: Patient is a 46 y.o.  female with history of HLD-who presented with intractable back pain following a recent trip to [place] (ongoing since 9/13)-outpatient MRI showed L5-S1 discitis/osteomyelitis-she was then admitted to [facility].  Post admission-blood cultures positive for MSSA.  Significant events: 9/27>> admit to [facility]  Significant studies: 9/27>> MRI lumbar spine: Discitis/osteomyelitis at L5-S1. 9/27>> RUQ ultrasound: S/p cholecystectomy-no acute lesions.  Significant microbiology data: 9/27>> blood culture:+ MSSA (prelim)  Procedures: None  Consults: ID IR  Subjective: Has back pain-unchanged  Objective: Vitals: Blood pressure (!) 146/92, pulse 90, temperature 98 F (36.7 C), temperature source Oral, resp. rate 16, height 5\' 8"  (1.727 m), weight 94.3 kg, last menstrual period 07/03/2022, SpO2 98%.   Exam: Gen Exam:Alert awake-not in any distress HEENT:atraumatic, normocephalic Chest: B/L clear to auscultation anteriorly CVS:S1S2 regular Abdomen:soft non tender, non distended Extremities:no edema Neurology: Non focal Skin: no rash  Pertinent Labs/Radiology:    Latest Ref Rng & Units 06/16/2023    6:12 AM 06/15/2023    1:41 PM 07/25/2022   11:21 AM  CBC  WBC 4.0 - 10.5 K/uL 14.8  16.8  8.5   Hemoglobin 12.0 - 15.0 g/dL 82.9  56.2  13.0   Hematocrit 36.0 - 46.0 % 32.1  31.4  36.9   Platelets 150 - 400 K/uL 691  683  339.0     Lab Results  Component Value Date   NA 137 06/16/2023   K 4.2 06/16/2023   CL 99 06/16/2023   CO2 25 06/16/2023      Assessment/Plan: MSSA bacteremia with L5-S1 osteomyelitis/discitis Afebrile overnight-leukocytosis decreasing Blood culture positive (MSSA-prelim IR not able to perform  L5-S1 biopsy today ID following-now on Ancef Will defer further to ID  Transaminitis Probably due to MSSA bacteremia Acute hepatitis serology: Pending RUQ ultrasound nonacute  HLD Holding statin due to above  GERD:  PPI  Peripheral neuropathy Neurontin  Obesity: Estimated body mass index is 31.63 kg/m as calculated from the following:   Height as of this encounter: 5\' 8"  (1.727 m).   Weight as of this encounter: 94.3 kg.   Code status:   Code Status: Full Code   DVT Prophylaxis: enoxaparin (LOVENOX) injection 40 mg Start: 06/16/23 1030 SCDs Start: 06/15/23 1540   Family Communication: None at bedside   Disposition Plan: Status is: Inpatient Remains inpatient appropriate because: Severity of illness   Planned Discharge Destination:Home   Diet: Diet Order             Diet regular Room service appropriate? Yes; Fluid consistency: Thin  Diet effective now                     Antimicrobial agents: Anti-infectives (From admission, onward)    Start     Dose/Rate Route Frequency Ordered Stop   06/16/23 1000  ceFEPIme (MAXIPIME) 2 g in sodium chloride 0.9 % 100 mL IVPB  Status:  Discontinued        2 g 200 mL/hr over 30 Minutes Intravenous Every 8 hours 06/16/23 0910 06/16/23 0931   06/16/23 0945  ceFAZolin (ANCEF) IVPB 2g/100 mL premix  2 g 200 mL/hr over 30 Minutes Intravenous Every 8 hours 06/16/23 0932          MEDICATIONS: Scheduled Meds:  enoxaparin (LOVENOX) injection  40 mg Subcutaneous Q24H   gabapentin  300 mg Oral QHS   pantoprazole  40 mg Oral Daily   senna  1 tablet Oral BID   traZODone  100 mg Oral QHS   Continuous Infusions:   ceFAZolin (ANCEF) IV     PRN Meds:.acetaminophen **OR** acetaminophen, HYDROmorphone (DILAUDID) injection, ondansetron **OR** ondansetron (ZOFRAN) IV, oxyCODONE   I have personally reviewed following labs and imaging studies  LABORATORY DATA: CBC: Recent Labs  Lab 06/15/23 1341 06/16/23 0612   WBC 16.8* 14.8*  NEUTROABS 12.3*  --   HGB 10.3* 10.3*  HCT 31.4* 32.1*  MCV 86.3 89.7  PLT 683* 691*    Basic Metabolic Panel: Recent Labs  Lab 06/15/23 1341 06/16/23 0612  NA 138 137  K 3.8 4.2  CL 99 99  CO2 26 25  GLUCOSE 103* 123*  BUN 14 12  CREATININE 0.78 0.92  CALCIUM 9.9 9.3  MG  --  2.2    GFR: Estimated Creatinine Clearance: 89.8 mL/min (by C-G formula based on SCr of 0.92 mg/dL).  Liver Function Tests: Recent Labs  Lab 06/15/23 1341 06/16/23 0612  AST 47* 136*  ALT 128* 148*  ALKPHOS 277* 323*  BILITOT 0.6 1.0  PROT 8.0 7.1  ALBUMIN 4.0 2.7*   No results for input(s): "LIPASE", "AMYLASE" in the last 168 hours. No results for input(s): "AMMONIA" in the last 168 hours.  Coagulation Profile: No results for input(s): "INR", "PROTIME" in the last 168 hours.  Cardiac Enzymes: No results for input(s): "CKTOTAL", "CKMB", "CKMBINDEX", "TROPONINI" in the last 168 hours.  BNP (last 3 results) No results for input(s): "PROBNP" in the last 8760 hours.  Lipid Profile: No results for input(s): "CHOL", "HDL", "LDLCALC", "TRIG", "CHOLHDL", "LDLDIRECT" in the last 72 hours.  Thyroid Function Tests: No results for input(s): "TSH", "T4TOTAL", "FREET4", "T3FREE", "THYROIDAB" in the last 72 hours.  Anemia Panel: No results for input(s): "VITAMINB12", "FOLATE", "FERRITIN", "TIBC", "IRON", "RETICCTPCT" in the last 72 hours.  Urine analysis:    Component Value Date/Time   BILIRUBINUR neg 01/08/2014 1531   PROTEINUR neg 01/08/2014 1531   UROBILINOGEN negative 01/08/2014 1531   NITRITE neg 01/08/2014 1531   LEUKOCYTESUR Negative 01/08/2014 1531    Sepsis Labs: Lactic Acid, Venous No results found for: "LATICACIDVEN"  MICROBIOLOGY: Recent Results (from the past 240 hour(s))  Culture, blood (routine x 2)     Status: None (Preliminary result)   Collection Time: 06/15/23  1:40 PM   Specimen: BLOOD  Result Value Ref Range Status   Specimen Description    Final    BLOOD BLOOD LEFT ARM Performed at [facility], [address] [place], [place] [address]    Special Requests   Final    BOTTLES DRAWN AEROBIC AND ANAEROBIC Blood Culture adequate volume Performed at [facility], [address] [place], [place] [address]    Culture  Setup Time   Final    CORRECTED RESULTS GRAM POSITIVE COCCI AEROBIC BOTTLE ONLY Organism ID to follow PREVIOUSLY REPORTED AS: GRAM NEGATIVE RODS CORRECTED RESULTS CALLED TO: Gypsy Lore AMEND [id number] BY Berline Chough, [place] Performed at [facility], [address], [place], [place] [address]    Culture [facility] POSITIVE COCCI  Final   Report Status PENDING  Incomplete  Blood Culture ID Panel (  Reflexed)     Status: Abnormal   Collection Time: 06/15/23  1:40 PM  Result Value Ref Range Status   Enterococcus faecalis NOT DETECTED NOT DETECTED Final   Enterococcus Faecium NOT DETECTED NOT DETECTED Final   Listeria monocytogenes NOT DETECTED NOT DETECTED Final   Staphylococcus species DETECTED (A) NOT DETECTED Final    Comment: CRITICAL RESULT CALLED TO, READ BACK BY AND VERIFIED WITH: PHARMD CARON AMEND [id number] BY  J RAZZAK, MT    Staphylococcus aureus (BCID) DETECTED (A) NOT DETECTED Final    Comment: CRITICAL RESULT CALLED TO, READ BACK BY AND VERIFIED WITH: PHARMD CARON AMEND [id number] BY J RAZZAK, MT    Staphylococcus epidermidis NOT DETECTED NOT DETECTED Final   Staphylococcus lugdunensis NOT DETECTED NOT DETECTED Final   Streptococcus species NOT DETECTED NOT DETECTED Final   Streptococcus agalactiae NOT DETECTED NOT DETECTED Final   Streptococcus pneumoniae NOT DETECTED NOT DETECTED Final   Streptococcus pyogenes NOT DETECTED NOT DETECTED Final   A.calcoaceticus-baumannii NOT DETECTED NOT DETECTED Final   Bacteroides fragilis NOT DETECTED NOT DETECTED Final   Enterobacterales NOT DETECTED NOT DETECTED Final   Enterobacter cloacae complex NOT  DETECTED NOT DETECTED Final   Escherichia coli NOT DETECTED NOT DETECTED Final   Klebsiella aerogenes NOT DETECTED NOT DETECTED Final   Klebsiella oxytoca NOT DETECTED NOT DETECTED Final   Klebsiella pneumoniae NOT DETECTED NOT DETECTED Final   Proteus species NOT DETECTED NOT DETECTED Final   Salmonella species NOT DETECTED NOT DETECTED Final   Serratia marcescens NOT DETECTED NOT DETECTED Final   Haemophilus influenzae NOT DETECTED NOT DETECTED Final   Neisseria meningitidis NOT DETECTED NOT DETECTED Final   Pseudomonas aeruginosa NOT DETECTED NOT DETECTED Final   Stenotrophomonas maltophilia NOT DETECTED NOT DETECTED Final   Candida albicans NOT DETECTED NOT DETECTED Final   Candida auris NOT DETECTED NOT DETECTED Final   Candida glabrata NOT DETECTED NOT DETECTED Final   Candida krusei NOT DETECTED NOT DETECTED Final   Candida parapsilosis NOT DETECTED NOT DETECTED Final   Candida tropicalis NOT DETECTED NOT DETECTED Final   Cryptococcus neoformans/gattii NOT DETECTED NOT DETECTED Final   Meth resistant mecA/C and MREJ NOT DETECTED NOT DETECTED Final    Comment: Performed at [facility], [address], [place], [place] [address]  Culture, blood (routine x 2)     Status: None (Preliminary result)   Collection Time: 06/15/23  1:41 PM   Specimen: BLOOD RIGHT ARM  Result Value Ref Range Status   Specimen Description   Final    BLOOD RIGHT ARM Performed at [facility], [address], [place], [place] [address]    Special Requests   Final    BOTTLES DRAWN AEROBIC AND ANAEROBIC Blood Culture adequate volume Performed at [facility], [address] [place], [place] [address]    Culture  Setup Time   Final    GRAM POSITIVE COCCI AEROBIC BOTTLE ONLY CRITICAL VALUE NOTED.  VALUE IS CONSISTENT WITH PREVIOUSLY REPORTED AND CALLED VALUE. Performed at [facility], [address] [place], [place] [address]    Culture [facility] POSITIVE COCCI  Final    Report Status PENDING  Incomplete    RADIOLOGY STUDIES/RESULTS: US Abdomen Limited RUQ (LIVER/GB)  Result Date: 06/15/2023 CLINICAL DATA:  Elevated liver function studies. EXAM: ULTRASOUND ABDOMEN LIMITED RIGHT UPPER QUADRANT COMPARISON:  None Available. FINDINGS: Gallbladder: Gallbladder is surgically absent. Common bile duct: Diameter: 3 mm, normal Liver: Mildly nodular contour  to the liver may indicate cirrhosis. No focal lesion identified. Within normal limits in parenchymal echogenicity. Portal vein is patent on color Doppler imaging with normal direction of blood flow towards the liver. Other: None. IMPRESSION: Surgical absence of the gallbladder. No bile duct dilatation or focal liver lesions. Mild nodular contour to the liver may indicate cirrhosis. Electronically Signed   By: Burman Nieves M.D.   On: 06/15/2023 20:37   MR Lumbar Spine W Wo Contrast  Addendum Date: 06/15/2023   ADDENDUM REPORT: 06/15/2023 09:07 ADDENDUM: Study discussed by telephone with Dr. Antoine Primas on 06/15/2023 at 0846 hours. Electronically Signed   By: Odessa Fleming M.D.   On: 06/15/2023 09:07   Result Date: 06/15/2023 CLINICAL DATA:  47 year old female with fall in [place], persistent back pain, abnormal L5-S1 disc space on pelvis MRI recently. EXAM: MRI LUMBAR SPINE WITHOUT AND WITH CONTRAST TECHNIQUE: Multiplanar and multiecho pulse sequences of the lumbar spine were obtained without and with intravenous contrast. CONTRAST:  10mL GADAVIST GADOBUTROL 1 MMOL/ML IV SOLN COMPARISON:  Pelvis MRI 06/07/2023. FINDINGS: Segmentation: Lumbar segmentation appears to be normal and will be designated as such for this report. Alignment:  Maintained lumbar lordosis.  No spondylolisthesis. Vertebrae: Abnormal marrow signal at the L5 and S1 vertebral bodies, tracking into the left S1 sacral ala also (series 9, image 38). Patchy and irregular marrow edema and enhancement there surrounding abnormal disc space which is T2 and STIR  hyperintense (series 7 image 9) and evidence of circumferential paraspinal soft tissue inflammation, presacral inflammation there (series 4, image 9). Faint L5 inferior endplate erosion is possible on series 7, image 10. There is epidural space involvement, further detailed below. The posterior elements are relatively spared. No other marrow edema or evidence of acute osseous abnormality. Conus medullaris and cauda equina: Conus extends to the L2 level. No lower spinal cord or conus signal abnormality. Normal cauda equina nerve roots from the conus to the L4-L5 disc space level. Abnormal epidural and dural thickening and enhancement beginning at L5 and continuing into the sacrum, more pronounced ventrally but circumferential. And there is an associated epidural space abscess tracking from the right lateral epidural space at L1 (series 9, image 38) in the posterior and lateral sacral spinal canal to at least the S2-S3 level (series 4, image 9 and series 5 image 44. Direct involvement of the exiting right sacral nerve roots. Inflammation in the bilateral L5 and S1 neural foramina greater on the left. See additional details below. Paraspinal and other soft tissues: Ventral and lateral more so than posterior paraspinal soft tissue inflammation centered at L5-S1. No direct involvement of the psoas muscles. Minimal posterior erector spinae muscle involvement. No intramuscular abscess. Negative visible abdominal viscera. Disc levels: Negative from the visible lower thoracic spine through L3-L4. L4-L5: Disc desiccation. Small posterior disc bulge and/or annular fissure on series 5, image 30. Mild facet hypertrophy. No stenosis. L5-S1: Discitis osteomyelitis with disc space loss. Indeterminate bilateral L5-S1 facet joint fluid. No obvious facet marrow edema at this time. Pronounced generalized epidural space inflammation and phlegmon, especially in the ventral epidural space (series 6, image 37). Small but discrete epidural  abscess beginning in the posterolateral epidural space of S1 and continuing through the right posterior sacral spinal canal. Mild to moderate spinal stenosis at L5-S1. Confluent bilateral L5 foraminal inflammation as well as mild to moderate multifactorial foraminal stenosis. IMPRESSION: 1. Positive for Discitis Osteomyelitis at L5-S1. Pronounced epidural inflammation there, and small sacral epidural abscess tracking within the right  posterior and lateral sacral epidural space from S1 through at least S3, involving the exiting right sacral nerve roots. Subsequent mild to moderate L5-S1 spinal stenosis, in part due to dural thickening and enhancement. And bilateral L5 neural foraminal stenosis and foraminal inflammation. 2. No paraspinal muscle abscess.  No other lumbar levels involved. Electronically Signed: By: Odessa Fleming M.D. On: 06/15/2023 08:34     LOS: 1 day   Jeoffrey Massed, MD  [facility]    To contact the attending provider between 7A-7P or the covering provider during after hours 7P-7A, please log into the web site [email or web address] and access using universal [place] password for that web site. If you do not have the password, please call the hospital operator.  06/16/2023, 9:39 AM

## 2023-06-16 NOTE — Progress Notes (Signed)
PHARMACY - PHYSICIAN COMMUNICATION CRITICAL VALUE ALERT - BLOOD CULTURE IDENTIFICATION (BCID)  Assessment:  Susan [name] a 47 y.o. female presenting with back pain. Patient had a shot in the back from a physician in [place] (for back pain). MRI findings c/w lumbar discitis/osteo.   9/27 Bcx  with 1/4 staph aureus, no resistance detected on BCID. Microbiology technician stated that she did not observe GNRs on gram stain and would correct previously reported value.   Name of physician (or Provider) Contacted: Pervis Hocking and Jeoffrey Massed, MD   Current antibiotics: Cefepime 2g IV Q8H   Changes to prescribed antibiotics recommended: Narrow antibiotics to cefazolin 2g IV Q8H  Recommendations accepted by provider  Results for orders placed or performed during the hospital encounter of 06/15/23  Blood Culture ID Panel (Reflexed) (Collected: 06/15/2023  1:40 PM)  Result Value Ref Range   Enterococcus faecalis NOT DETECTED NOT DETECTED   Enterococcus Faecium NOT DETECTED NOT DETECTED   Listeria monocytogenes NOT DETECTED NOT DETECTED   Staphylococcus species DETECTED (A) NOT DETECTED   Staphylococcus aureus (BCID) DETECTED (A) NOT DETECTED   Staphylococcus epidermidis NOT DETECTED NOT DETECTED   Staphylococcus lugdunensis NOT DETECTED NOT DETECTED   Streptococcus species NOT DETECTED NOT DETECTED   Streptococcus agalactiae NOT DETECTED NOT DETECTED   Streptococcus pneumoniae NOT DETECTED NOT DETECTED   Streptococcus pyogenes NOT DETECTED NOT DETECTED   A.calcoaceticus-baumannii NOT DETECTED NOT DETECTED   Bacteroides fragilis NOT DETECTED NOT DETECTED   Enterobacterales NOT DETECTED NOT DETECTED   Enterobacter cloacae complex NOT DETECTED NOT DETECTED   Escherichia coli NOT DETECTED NOT DETECTED   Klebsiella aerogenes NOT DETECTED NOT DETECTED   Klebsiella oxytoca NOT DETECTED NOT DETECTED   Klebsiella pneumoniae NOT DETECTED NOT DETECTED   Proteus species NOT DETECTED NOT  DETECTED   Salmonella species NOT DETECTED NOT DETECTED   Serratia marcescens NOT DETECTED NOT DETECTED   Haemophilus influenzae NOT DETECTED NOT DETECTED   Neisseria meningitidis NOT DETECTED NOT DETECTED   Pseudomonas aeruginosa NOT DETECTED NOT DETECTED   Stenotrophomonas maltophilia NOT DETECTED NOT DETECTED   Candida albicans NOT DETECTED NOT DETECTED   Candida auris NOT DETECTED NOT DETECTED   Candida glabrata NOT DETECTED NOT DETECTED   Candida krusei NOT DETECTED NOT DETECTED   Candida parapsilosis NOT DETECTED NOT DETECTED   Candida tropicalis NOT DETECTED NOT DETECTED   Cryptococcus neoformans/gattii NOT DETECTED NOT DETECTED   Meth resistant mecA/C and MREJ NOT DETECTED NOT DETECTED    Jani Gravel, PharmD Clinical Pharmacist  06/16/2023 9:28 AM

## 2023-06-17 DIAGNOSIS — R7401 Elevation of levels of liver transaminase levels: Secondary | ICD-10-CM | POA: Diagnosis not present

## 2023-06-17 DIAGNOSIS — M4647 Discitis, unspecified, lumbosacral region: Secondary | ICD-10-CM | POA: Diagnosis not present

## 2023-06-17 DIAGNOSIS — A4901 Methicillin susceptible Staphylococcus aureus infection, unspecified site: Secondary | ICD-10-CM | POA: Diagnosis not present

## 2023-06-17 DIAGNOSIS — K219 Gastro-esophageal reflux disease without esophagitis: Secondary | ICD-10-CM | POA: Diagnosis not present

## 2023-06-17 LAB — CBC WITH DIFFERENTIAL/PLATELET
Abs Immature Granulocytes: 0.12 10*3/uL — ABNORMAL HIGH (ref 0.00–0.07)
Basophils Absolute: 0.1 10*3/uL (ref 0.0–0.1)
Basophils Relative: 0 %
Eosinophils Absolute: 0.1 10*3/uL (ref 0.0–0.5)
Eosinophils Relative: 1 %
HCT: 36.4 % (ref 36.0–46.0)
Hemoglobin: 11.7 g/dL — ABNORMAL LOW (ref 12.0–15.0)
Immature Granulocytes: 1 %
Lymphocytes Relative: 15 %
Lymphs Abs: 2.4 10*3/uL (ref 0.7–4.0)
MCH: 27.9 pg (ref 26.0–34.0)
MCHC: 32.1 g/dL (ref 30.0–36.0)
MCV: 86.9 fL (ref 80.0–100.0)
Monocytes Absolute: 0.7 10*3/uL (ref 0.1–1.0)
Monocytes Relative: 5 %
Neutro Abs: 12.1 10*3/uL — ABNORMAL HIGH (ref 1.7–7.7)
Neutrophils Relative %: 78 %
Platelets: 802 10*3/uL — ABNORMAL HIGH (ref 150–400)
RBC: 4.19 MIL/uL (ref 3.87–5.11)
RDW: 14.4 % (ref 11.5–15.5)
WBC: 15.4 10*3/uL — ABNORMAL HIGH (ref 4.0–10.5)
nRBC: 0 % (ref 0.0–0.2)

## 2023-06-17 LAB — PROTIME-INR
INR: 1.1 (ref 0.8–1.2)
Prothrombin Time: 14 s (ref 11.4–15.2)

## 2023-06-17 LAB — COMPREHENSIVE METABOLIC PANEL
ALT: 197 U/L — ABNORMAL HIGH (ref 0–44)
AST: 141 U/L — ABNORMAL HIGH (ref 15–41)
Albumin: 3.1 g/dL — ABNORMAL LOW (ref 3.5–5.0)
Alkaline Phosphatase: 441 U/L — ABNORMAL HIGH (ref 38–126)
Anion gap: 16 — ABNORMAL HIGH (ref 5–15)
BUN: 11 mg/dL (ref 6–20)
CO2: 24 mmol/L (ref 22–32)
Calcium: 9.8 mg/dL (ref 8.9–10.3)
Chloride: 95 mmol/L — ABNORMAL LOW (ref 98–111)
Creatinine, Ser: 0.9 mg/dL (ref 0.44–1.00)
GFR, Estimated: >60 mL/min (ref >60)
Glucose, Bld: 109 mg/dL — ABNORMAL HIGH (ref 70–99)
Potassium: 3.8 mmol/L (ref 3.5–5.1)
Sodium: 135 mmol/L (ref 135–145)
Total Bilirubin: 0.9 mg/dL (ref 0.3–1.2)
Total Protein: 8.1 g/dL (ref 6.5–8.1)

## 2023-06-17 MED ORDER — IBUPROFEN 400 MG PO TABS
400.0000 mg | ORAL_TABLET | Freq: Four times a day (QID) | ORAL | Status: DC | PRN
  Administered 2023-06-19: 400 mg via ORAL
  Filled 2023-06-17: qty 1

## 2023-06-17 MED ORDER — BISACODYL 10 MG RE SUPP
10.0000 mg | Freq: Every day | RECTAL | Status: DC | PRN
  Administered 2023-06-18: 10 mg via RECTAL
  Filled 2023-06-17: qty 1

## 2023-06-17 MED ORDER — POLYETHYLENE GLYCOL 3350 17 G PO PACK
17.0000 g | PACK | Freq: Every day | ORAL | Status: DC
  Filled 2023-06-17: qty 1

## 2023-06-17 MED ORDER — SODIUM CHLORIDE 0.9 % IV SOLN
INTRAVENOUS | Status: DC
  Administered 2023-06-18: 20 mL/h via INTRAVENOUS

## 2023-06-17 NOTE — Plan of Care (Signed)

## 2023-06-17 NOTE — Progress Notes (Addendum)
Setup Time   Final    GRAM POSITIVE COCCI IN BOTH AEROBIC AND ANAEROBIC BOTTLES CRITICAL VALUE NOTED.  VALUE IS CONSISTENT WITH PREVIOUSLY REPORTED AND CALLED VALUE. Performed at [facility], [address] [place], [place] [address]    Culture STAPHYLOCOCCUS AUREUS (A)  Final   Report Status PENDING  Incomplete    RADIOLOGY STUDIES/RESULTS: MR THORACIC SPINE W WO CONTRAST  Result Date: 06/16/2023 CLINICAL DATA:  Mid back pain. Infection suspected at the L5-S1 level. EXAM: MRI THORACIC WITHOUT AND WITH CONTRAST TECHNIQUE: Multiplanar and multiecho pulse sequences of the thoracic spine were obtained without and with intravenous contrast. CONTRAST:  9mL GADAVIST GADOBUTROL 1 MMOL/ML IV SOLN COMPARISON:  Lumbar exam done yesterday. FINDINGS: Alignment:  Normal alignment in the thoracic region. Vertebrae: No evidence of thoracic region fracture or focal bone lesion. Cord: No canal narrowing. No compressive effect upon the cord. No focal cord lesion. Paraspinal and other soft tissues: Negative Disc levels: No disc level finding of significance in the thoracic region. No disc degeneration, bulge or herniation. Incidental and insignificant root sleeve cyst on the left at T11-12. No sign of spinal infection in the thoracic region. IMPRESSION: Negative study. No evidence of spinal infection in the thoracic region. Electronically Signed   By: Paulina Fusi M.D.   On: 06/16/2023 17:32   ECHOCARDIOGRAM COMPLETE  Result Date: 06/16/2023    ECHOCARDIOGRAM REPORT   Patient Name:   Susan [name] [facility] Date of Exam: 06/16/2023 Medical Rec #:  [id number]             Height:       68.0 in Accession #:    [id number]           Weight:       208.0 lb Date of Birth:  01/05/1976            BSA:          2.078 m Patient Age:    47 years             BP:           146/92 mmHg Patient Gender: F                    HR:           83 bpm. Exam Location:  Inpatient Procedure: 2D Echo, Color Doppler and Cardiac Doppler Indications:     Bacteremia  History:         Patient has prior history of Echocardiogram examinations, most                  recent 07/05/2022. Discitis, Ascending Ao Dilation; Risk                  Factors:Dyslipidemia.  Sonographer:     Milbert Coulter Referring Phys:  [id number] [facility] Diagnosing Phys: [place] IMPRESSIONS  1. Left ventricular ejection fraction, by estimation, is 60 to 65%. The left ventricle has normal function. The left ventricle has no regional wall motion abnormalities. Left ventricular diastolic parameters were normal.  2. Right ventricular systolic function is normal. The right ventricular size is normal.  3. The mitral valve was not well visualized. Mild mitral valve regurgitation.  4. The RCC appears thickened; c/f bicuspid AV seen on prior echo. The aortic valve is tricuspid. Aortic valve regurgitation is mild.  5. There is mild dilatation of the ascending aorta, measuring 41 mm.  6. The inferior vena  Setup Time   Final    GRAM POSITIVE COCCI IN BOTH AEROBIC AND ANAEROBIC BOTTLES CRITICAL VALUE NOTED.  VALUE IS CONSISTENT WITH PREVIOUSLY REPORTED AND CALLED VALUE. Performed at [facility], [address] [place], [place] [address]    Culture STAPHYLOCOCCUS AUREUS (A)  Final   Report Status PENDING  Incomplete    RADIOLOGY STUDIES/RESULTS: MR THORACIC SPINE W WO CONTRAST  Result Date: 06/16/2023 CLINICAL DATA:  Mid back pain. Infection suspected at the L5-S1 level. EXAM: MRI THORACIC WITHOUT AND WITH CONTRAST TECHNIQUE: Multiplanar and multiecho pulse sequences of the thoracic spine were obtained without and with intravenous contrast. CONTRAST:  9mL GADAVIST GADOBUTROL 1 MMOL/ML IV SOLN COMPARISON:  Lumbar exam done yesterday. FINDINGS: Alignment:  Normal alignment in the thoracic region. Vertebrae: No evidence of thoracic region fracture or focal bone lesion. Cord: No canal narrowing. No compressive effect upon the cord. No focal cord lesion. Paraspinal and other soft tissues: Negative Disc levels: No disc level finding of significance in the thoracic region. No disc degeneration, bulge or herniation. Incidental and insignificant root sleeve cyst on the left at T11-12. No sign of spinal infection in the thoracic region. IMPRESSION: Negative study. No evidence of spinal infection in the thoracic region. Electronically Signed   By: Paulina Fusi M.D.   On: 06/16/2023 17:32   ECHOCARDIOGRAM COMPLETE  Result Date: 06/16/2023    ECHOCARDIOGRAM REPORT   Patient Name:   Susan [name] [facility] Date of Exam: 06/16/2023 Medical Rec #:  [id number]             Height:       68.0 in Accession #:    [id number]           Weight:       208.0 lb Date of Birth:  01/05/1976            BSA:          2.078 m Patient Age:    47 years             BP:           146/92 mmHg Patient Gender: F                    HR:           83 bpm. Exam Location:  Inpatient Procedure: 2D Echo, Color Doppler and Cardiac Doppler Indications:     Bacteremia  History:         Patient has prior history of Echocardiogram examinations, most                  recent 07/05/2022. Discitis, Ascending Ao Dilation; Risk                  Factors:Dyslipidemia.  Sonographer:     Milbert Coulter Referring Phys:  [id number] [facility] Diagnosing Phys: [place] IMPRESSIONS  1. Left ventricular ejection fraction, by estimation, is 60 to 65%. The left ventricle has normal function. The left ventricle has no regional wall motion abnormalities. Left ventricular diastolic parameters were normal.  2. Right ventricular systolic function is normal. The right ventricular size is normal.  3. The mitral valve was not well visualized. Mild mitral valve regurgitation.  4. The RCC appears thickened; c/f bicuspid AV seen on prior echo. The aortic valve is tricuspid. Aortic valve regurgitation is mild.  5. There is mild dilatation of the ascending aorta, measuring 41 mm.  6. The inferior vena  PROGRESS NOTE        PATIENT DETAILS Name: Susan [name] Age: 47 y.o. Sex: female Date of Birth: 1975/11/28 Admit Date: 06/15/2023 Admitting Physician Costin Otelia Sergeant, MD VWU:[id number], Lelon Mast, [name]  Brief Summary: Patient is a 47 y.o.  female with history of HLD-who presented with intractable back pain following a recent trip to [place] (ongoing since 9/13)-outpatient MRI showed L5-S1 discitis/osteomyelitis-she was then admitted to [facility].  Post admission-blood cultures positive for MSSA.  Significant events: 9/27>> admit to [facility] [address]  Significant studies: 9/27>> MRI lumbar spine: Discitis/osteomyelitis at L5-S1. 9/27>> RUQ ultrasound: S/p cholecystectomy-no acute lesions. 9/28>> MRI thoracic spine: Negative for osteomyelitis. 9/28>> echo: EF 60-65%, bicuspid aortic valve  Significant microbiology data: 9/27>> blood culture: Staph aureus 9/29>> blood culture: Pending  Procedures: None  Consults: ID IR  Subjective: Continues to have some back pain.  No other complaints.  Objective: Vitals: Blood pressure 104/76, pulse 86, temperature 98.1 F (36.7 C), temperature source Oral, resp. rate 20, height 5\' 8"  (1.727 m), weight 94.3 kg, last menstrual period 07/03/2022, SpO2 99%.   Exam: Gen Exam:Alert awake-not in any distress HEENT:atraumatic, normocephalic Chest: B/L clear to auscultation anteriorly CVS:S1S2 regular Abdomen:soft non tender, non distended Extremities:no edema Neurology: Non focal Skin: no rash  Pertinent Labs/Radiology:    Latest Ref Rng & Units 06/17/2023    7:41 AM 06/16/2023    6:12 AM 06/15/2023    1:41 PM  CBC  WBC 4.0 - 10.5 K/uL 15.4  14.8  16.8   Hemoglobin 12.0 - 15.0 g/dL 47.8  29.5  62.1   Hematocrit 36.0 - 46.0 % 36.4  32.1  31.4   Platelets 150 - 400 K/uL 802  691  683     Lab Results  Component Value Date   NA 135 06/17/2023   K 3.8 06/17/2023   CL 95 (L) 06/17/2023   CO2 24 06/17/2023       Assessment/Plan: MSSA bacteremia with L5-S1 osteomyelitis/discitis Afebrile-continues to have persistent leukocytosis Repeat blood cultures today Discussed with ID-no need to perform L5-S1 biopsy On IV Ancef TEE will be scheduled by cardiology over the next several days Repeating blood culture today Await further recommendations from infectious disease   Transaminitis Probably due to MSSA bacteremia Acute hepatitis serology negative RUQ ultrasound nonacute Continue to follow LFTs-somewhat slightly worse today-INR stable Continue to hold statin If no improvement-we can consider GI consultation at some point.  HLD Holding statin due to above  GERD:  PPI  Peripheral neuropathy Neurontin  Obesity: Estimated body mass index is 31.63 kg/m as calculated from the following:   Height as of this encounter: 5\' 8"  (1.727 m).   Weight as of this encounter: 94.3 kg.   Code status:   Code Status: Full Code   DVT Prophylaxis: enoxaparin (LOVENOX) injection 40 mg Start: 06/16/23 1030 SCDs Start: 06/15/23 1540   Family Communication: None at bedside   Disposition Plan: Status is: Inpatient Remains inpatient appropriate because: Severity of illness   Planned Discharge Destination:Home   Diet: Diet Order             Diet regular Room service appropriate? Yes; Fluid consistency: Thin  Diet effective now                     Antimicrobial agents: Anti-infectives (From admission, onward)    Start  Setup Time   Final    GRAM POSITIVE COCCI IN BOTH AEROBIC AND ANAEROBIC BOTTLES CRITICAL VALUE NOTED.  VALUE IS CONSISTENT WITH PREVIOUSLY REPORTED AND CALLED VALUE. Performed at [facility], [address] [place], [place] [address]    Culture STAPHYLOCOCCUS AUREUS (A)  Final   Report Status PENDING  Incomplete    RADIOLOGY STUDIES/RESULTS: MR THORACIC SPINE W WO CONTRAST  Result Date: 06/16/2023 CLINICAL DATA:  Mid back pain. Infection suspected at the L5-S1 level. EXAM: MRI THORACIC WITHOUT AND WITH CONTRAST TECHNIQUE: Multiplanar and multiecho pulse sequences of the thoracic spine were obtained without and with intravenous contrast. CONTRAST:  9mL GADAVIST GADOBUTROL 1 MMOL/ML IV SOLN COMPARISON:  Lumbar exam done yesterday. FINDINGS: Alignment:  Normal alignment in the thoracic region. Vertebrae: No evidence of thoracic region fracture or focal bone lesion. Cord: No canal narrowing. No compressive effect upon the cord. No focal cord lesion. Paraspinal and other soft tissues: Negative Disc levels: No disc level finding of significance in the thoracic region. No disc degeneration, bulge or herniation. Incidental and insignificant root sleeve cyst on the left at T11-12. No sign of spinal infection in the thoracic region. IMPRESSION: Negative study. No evidence of spinal infection in the thoracic region. Electronically Signed   By: Paulina Fusi M.D.   On: 06/16/2023 17:32   ECHOCARDIOGRAM COMPLETE  Result Date: 06/16/2023    ECHOCARDIOGRAM REPORT   Patient Name:   Susan [name] [facility] Date of Exam: 06/16/2023 Medical Rec #:  [id number]             Height:       68.0 in Accession #:    [id number]           Weight:       208.0 lb Date of Birth:  01/05/1976            BSA:          2.078 m Patient Age:    47 years             BP:           146/92 mmHg Patient Gender: F                    HR:           83 bpm. Exam Location:  Inpatient Procedure: 2D Echo, Color Doppler and Cardiac Doppler Indications:     Bacteremia  History:         Patient has prior history of Echocardiogram examinations, most                  recent 07/05/2022. Discitis, Ascending Ao Dilation; Risk                  Factors:Dyslipidemia.  Sonographer:     Milbert Coulter Referring Phys:  [id number] Daylene Katayama [facility] Diagnosing Phys: [place] IMPRESSIONS  1. Left ventricular ejection fraction, by estimation, is 60 to 65%. The left ventricle has normal function. The left ventricle has no regional wall motion abnormalities. Left ventricular diastolic parameters were normal.  2. Right ventricular systolic function is normal. The right ventricular size is normal.  3. The mitral valve was not well visualized. Mild mitral valve regurgitation.  4. The RCC appears thickened; c/f bicuspid AV seen on prior echo. The aortic valve is tricuspid. Aortic valve regurgitation is mild.  5. There is mild dilatation of the ascending aorta, measuring 41 mm.  6. The inferior vena  PROGRESS NOTE        PATIENT DETAILS Name: Susan [name] Age: 47 y.o. Sex: female Date of Birth: 1975/11/28 Admit Date: 06/15/2023 Admitting Physician Costin Otelia Sergeant, MD VWU:[id number], [place] M[place], [name]  Brief Summary: Patient is a 47 y.o.  female with history of HLD-who presented with intractable back pain following a recent trip to [place] (ongoing since 9/13)-outpatient MRI showed L5-S1 discitis/osteomyelitis-she was then admitted to [facility].  Post admission-blood cultures positive for MSSA.  Significant events: 9/27>> admit to [facility] [address]  Significant studies: 9/27>> MRI lumbar spine: Discitis/osteomyelitis at L5-S1. 9/27>> RUQ ultrasound: S/p cholecystectomy-no acute lesions. 9/28>> MRI thoracic spine: Negative for osteomyelitis. 9/28>> echo: EF 60-65%, bicuspid aortic valve  Significant microbiology data: 9/27>> blood culture: Staph aureus 9/29>> blood culture: Pending  Procedures: None  Consults: ID IR  Subjective: Continues to have some back pain.  No other complaints.  Objective: Vitals: Blood pressure 104/76, pulse 86, temperature 98.1 F (36.7 C), temperature source Oral, resp. rate 20, height 5\' 8"  (1.727 m), weight 94.3 kg, last menstrual period 07/03/2022, SpO2 99%.   Exam: Gen Exam:Alert awake-not in any distress HEENT:atraumatic, normocephalic Chest: B/L clear to auscultation anteriorly CVS:S1S2 regular Abdomen:soft non tender, non distended Extremities:no edema Neurology: Non focal Skin: no rash  Pertinent Labs/Radiology:    Latest Ref Rng & Units 06/17/2023    7:41 AM 06/16/2023    6:12 AM 06/15/2023    1:41 PM  CBC  WBC 4.0 - 10.5 K/uL 15.4  14.8  16.8   Hemoglobin 12.0 - 15.0 g/dL 47.8  29.5  62.1   Hematocrit 36.0 - 46.0 % 36.4  32.1  31.4   Platelets 150 - 400 K/uL 802  691  683     Lab Results  Component Value Date   NA 135 06/17/2023   K 3.8 06/17/2023   CL 95 (L) 06/17/2023   CO2 24 06/17/2023       Assessment/Plan: MSSA bacteremia with L5-S1 osteomyelitis/discitis Afebrile-continues to have persistent leukocytosis Repeat blood cultures today Discussed with ID-no need to perform L5-S1 biopsy On IV Ancef TEE will be scheduled by cardiology over the next several days Repeating blood culture today Await further recommendations from infectious disease   Transaminitis Probably due to MSSA bacteremia Acute hepatitis serology negative RUQ ultrasound nonacute Continue to follow LFTs-somewhat slightly worse today-INR stable Continue to hold statin If no improvement-we can consider GI consultation at some point.  HLD Holding statin due to above  GERD:  PPI  Peripheral neuropathy Neurontin  Obesity: Estimated body mass index is 31.63 kg/m as calculated from the following:   Height as of this encounter: 5\' 8"  (1.727 m).   Weight as of this encounter: 94.3 kg.   Code status:   Code Status: Full Code   DVT Prophylaxis: enoxaparin (LOVENOX) injection 40 mg Start: 06/16/23 1030 SCDs Start: 06/15/23 1540   Family Communication: None at bedside   Disposition Plan: Status is: Inpatient Remains inpatient appropriate because: Severity of illness   Planned Discharge Destination:Home   Diet: Diet Order             Diet regular Room service appropriate? Yes; Fluid consistency: Thin  Diet effective now                     Antimicrobial agents: Anti-infectives (From admission, onward)    Start  PROGRESS NOTE        PATIENT DETAILS Name: Susan [name] Age: 47 y.o. Sex: female Date of Birth: 1975/11/28 Admit Date: 06/15/2023 Admitting Physician Costin Otelia Sergeant, MD VWU:JWJXBJ, [place] Mast, [name]  Brief Summary: Patient is a 47 y.o.  female with history of HLD-who presented with intractable back pain following a recent trip to [place] (ongoing since 9/13)-outpatient MRI showed L5-S1 discitis/osteomyelitis-she was then admitted to [facility].  Post admission-blood cultures positive for MSSA.  Significant events: 9/27>> admit to [facility]  Significant studies: 9/27>> MRI lumbar spine: Discitis/osteomyelitis at L5-S1. 9/27>> RUQ ultrasound: S/p cholecystectomy-no acute lesions. 9/28>> MRI thoracic spine: Negative for osteomyelitis. 9/28>> echo: EF 60-65%, bicuspid aortic valve  Significant microbiology data: 9/27>> blood culture: Staph aureus 9/29>> blood culture: Pending  Procedures: None  Consults: ID IR  Subjective: Continues to have some back pain.  No other complaints.  Objective: Vitals: Blood pressure 104/76, pulse 86, temperature 98.1 F (36.7 C), temperature source Oral, resp. rate 20, height 5\' 8"  (1.727 m), weight 94.3 kg, last menstrual period 07/03/2022, SpO2 99%.   Exam: Gen Exam:Alert awake-not in any distress HEENT:atraumatic, normocephalic Chest: B/L clear to auscultation anteriorly CVS:S1S2 regular Abdomen:soft non tender, non distended Extremities:no edema Neurology: Non focal Skin: no rash  Pertinent Labs/Radiology:    Latest Ref Rng & Units 06/17/2023    7:41 AM 06/16/2023    6:12 AM 06/15/2023    1:41 PM  CBC  WBC 4.0 - 10.5 K/uL 15.4  14.8  16.8   Hemoglobin 12.0 - 15.0 g/dL 47.8  29.5  62.1   Hematocrit 36.0 - 46.0 % 36.4  32.1  31.4   Platelets 150 - 400 K/uL 802  691  683     Lab Results  Component Value Date   NA 135 06/17/2023   K 3.8 06/17/2023   CL 95 (L) 06/17/2023   CO2 24 06/17/2023       Assessment/Plan: MSSA bacteremia with L5-S1 osteomyelitis/discitis Afebrile-continues to have persistent leukocytosis Repeat blood cultures today Discussed with ID-no need to perform L5-S1 biopsy On IV Ancef TEE will be scheduled by cardiology over the next several days Repeating blood culture today Await further recommendations from infectious disease   Transaminitis Probably due to MSSA bacteremia Acute hepatitis serology negative RUQ ultrasound nonacute Continue to follow LFTs-somewhat slightly worse today-INR stable Continue to hold statin If no improvement-we can consider GI consultation at some point.  HLD Holding statin due to above  GERD:  PPI  Peripheral neuropathy Neurontin  Obesity: Estimated body mass index is 31.63 kg/m as calculated from the following:   Height as of this encounter: 5\' 8"  (1.727 m).   Weight as of this encounter: 94.3 kg.   Code status:   Code Status: Full Code   DVT Prophylaxis: enoxaparin (LOVENOX) injection 40 mg Start: 06/16/23 1030 SCDs Start: 06/15/23 1540   Family Communication: None at bedside   Disposition Plan: Status is: Inpatient Remains inpatient appropriate because: Severity of illness   Planned Discharge Destination:Home   Diet: Diet Order             Diet regular Room service appropriate? Yes; Fluid consistency: Thin  Diet effective now                     Antimicrobial agents: Anti-infectives (From admission, onward)    Start

## 2023-06-17 NOTE — H&P (View-Only) (Signed)
Setup Time   Final    GRAM POSITIVE COCCI IN BOTH AEROBIC AND ANAEROBIC BOTTLES CRITICAL VALUE NOTED.  VALUE IS CONSISTENT WITH PREVIOUSLY REPORTED AND CALLED VALUE. Performed at [facility], [address] [place], [place] [address]    Culture STAPHYLOCOCCUS AUREUS (A)  Final   Report Status PENDING  Incomplete    RADIOLOGY STUDIES/RESULTS: MR THORACIC SPINE W WO CONTRAST  Result Date: 06/16/2023 CLINICAL DATA:  Mid back pain. Infection suspected at the L5-S1 level. EXAM: MRI THORACIC WITHOUT AND WITH CONTRAST TECHNIQUE: Multiplanar and multiecho pulse sequences of the thoracic spine were obtained without and with intravenous contrast. CONTRAST:  9mL GADAVIST GADOBUTROL 1 MMOL/ML IV SOLN COMPARISON:  Lumbar exam done yesterday. FINDINGS: Alignment:  Normal alignment in the thoracic region. Vertebrae: No evidence of thoracic region fracture or focal bone lesion. Cord: No canal narrowing. No compressive effect upon the cord. No focal cord lesion. Paraspinal and other soft tissues: Negative Disc levels: No disc level finding of significance in the thoracic region. No disc degeneration, bulge or herniation. Incidental and insignificant root sleeve cyst on the left at T11-12. No sign of spinal infection in the thoracic region. IMPRESSION: Negative study. No evidence of spinal infection in the thoracic region. Electronically Signed   By: Paulina Fusi M.D.   On: 06/16/2023 17:32   ECHOCARDIOGRAM COMPLETE  Result Date: 06/16/2023    ECHOCARDIOGRAM REPORT   Patient Name:   Susan [name] [facility] Date of Exam: 06/16/2023 Medical Rec #:  [id number]             Height:       68.0 in Accession #:    [id number]           Weight:       208.0 lb Date of Birth:  01/05/1976            BSA:          2.078 m Patient Age:    47 years             BP:           146/92 mmHg Patient Gender: F                    HR:           83 bpm. Exam Location:  Inpatient Procedure: 2D Echo, Color Doppler and Cardiac Doppler Indications:     Bacteremia  History:         Patient has prior history of Echocardiogram examinations, most                  recent 07/05/2022. Discitis, Ascending Ao Dilation; Risk                  Factors:Dyslipidemia.  Sonographer:     Milbert Coulter Referring Phys:  [id number] [facility] Diagnosing Phys: [place] IMPRESSIONS  1. Left ventricular ejection fraction, by estimation, is 60 to 65%. The left ventricle has normal function. The left ventricle has no regional wall motion abnormalities. Left ventricular diastolic parameters were normal.  2. Right ventricular systolic function is normal. The right ventricular size is normal.  3. The mitral valve was not well visualized. Mild mitral valve regurgitation.  4. The RCC appears thickened; c/f bicuspid AV seen on prior echo. The aortic valve is tricuspid. Aortic valve regurgitation is mild.  5. There is mild dilatation of the ascending aorta, measuring 41 mm.  6. The inferior vena  Setup Time   Final    GRAM POSITIVE COCCI IN BOTH AEROBIC AND ANAEROBIC BOTTLES CRITICAL VALUE NOTED.  VALUE IS CONSISTENT WITH PREVIOUSLY REPORTED AND CALLED VALUE. Performed at [facility], [address] [place], [place] [address]    Culture STAPHYLOCOCCUS AUREUS (A)  Final   Report Status PENDING  Incomplete    RADIOLOGY STUDIES/RESULTS: MR THORACIC SPINE W WO CONTRAST  Result Date: 06/16/2023 CLINICAL DATA:  Mid back pain. Infection suspected at the L5-S1 level. EXAM: MRI THORACIC WITHOUT AND WITH CONTRAST TECHNIQUE: Multiplanar and multiecho pulse sequences of the thoracic spine were obtained without and with intravenous contrast. CONTRAST:  9mL GADAVIST GADOBUTROL 1 MMOL/ML IV SOLN COMPARISON:  Lumbar exam done yesterday. FINDINGS: Alignment:  Normal alignment in the thoracic region. Vertebrae: No evidence of thoracic region fracture or focal bone lesion. Cord: No canal narrowing. No compressive effect upon the cord. No focal cord lesion. Paraspinal and other soft tissues: Negative Disc levels: No disc level finding of significance in the thoracic region. No disc degeneration, bulge or herniation. Incidental and insignificant root sleeve cyst on the left at T11-12. No sign of spinal infection in the thoracic region. IMPRESSION: Negative study. No evidence of spinal infection in the thoracic region. Electronically Signed   By: Paulina Fusi M.D.   On: 06/16/2023 17:32   ECHOCARDIOGRAM COMPLETE  Result Date: 06/16/2023    ECHOCARDIOGRAM REPORT   Patient Name:   Susan [name] [facility] Date of Exam: 06/16/2023 Medical Rec #:  [id number]             Height:       68.0 in Accession #:    [id number]           Weight:       208.0 lb Date of Birth:  01/05/1976            BSA:          2.078 m Patient Age:    47 years             BP:           146/92 mmHg Patient Gender: F                    HR:           83 bpm. Exam Location:  Inpatient Procedure: 2D Echo, Color Doppler and Cardiac Doppler Indications:     Bacteremia  History:         Patient has prior history of Echocardiogram examinations, most                  recent 07/05/2022. Discitis, Ascending Ao Dilation; Risk                  Factors:Dyslipidemia.  Sonographer:     Milbert Coulter Referring Phys:  [id number] [facility] Diagnosing Phys: [place] IMPRESSIONS  1. Left ventricular ejection fraction, by estimation, is 60 to 65%. The left ventricle has normal function. The left ventricle has no regional wall motion abnormalities. Left ventricular diastolic parameters were normal.  2. Right ventricular systolic function is normal. The right ventricular size is normal.  3. The mitral valve was not well visualized. Mild mitral valve regurgitation.  4. The RCC appears thickened; c/f bicuspid AV seen on prior echo. The aortic valve is tricuspid. Aortic valve regurgitation is mild.  5. There is mild dilatation of the ascending aorta, measuring 41 mm.  6. The inferior vena  PROGRESS NOTE        PATIENT DETAILS Name: Susan [name] Age: 47 y.o. Sex: female Date of Birth: 1975/11/28 Admit Date: 06/15/2023 Admitting Physician Costin Otelia Sergeant, MD VWU:[id number], Lelon Mast, [name]  Brief Summary: Patient is a 47 y.o.  female with history of HLD-who presented with intractable back pain following a recent trip to [place] (ongoing since 9/13)-outpatient MRI showed L5-S1 discitis/osteomyelitis-she was then admitted to [facility].  Post admission-blood cultures positive for MSSA.  Significant events: 9/27>> admit to [facility] [address]  Significant studies: 9/27>> MRI lumbar spine: Discitis/osteomyelitis at L5-S1. 9/27>> RUQ ultrasound: S/p cholecystectomy-no acute lesions. 9/28>> MRI thoracic spine: Negative for osteomyelitis. 9/28>> echo: EF 60-65%, bicuspid aortic valve  Significant microbiology data: 9/27>> blood culture: Staph aureus 9/29>> blood culture: Pending  Procedures: None  Consults: ID IR  Subjective: Continues to have some back pain.  No other complaints.  Objective: Vitals: Blood pressure 104/76, pulse 86, temperature 98.1 F (36.7 C), temperature source Oral, resp. rate 20, height 5\' 8"  (1.727 m), weight 94.3 kg, last menstrual period 07/03/2022, SpO2 99%.   Exam: Gen Exam:Alert awake-not in any distress HEENT:atraumatic, normocephalic Chest: B/L clear to auscultation anteriorly CVS:S1S2 regular Abdomen:soft non tender, non distended Extremities:no edema Neurology: Non focal Skin: no rash  Pertinent Labs/Radiology:    Latest Ref Rng & Units 06/17/2023    7:41 AM 06/16/2023    6:12 AM 06/15/2023    1:41 PM  CBC  WBC 4.0 - 10.5 K/uL 15.4  14.8  16.8   Hemoglobin 12.0 - 15.0 g/dL 47.8  29.5  62.1   Hematocrit 36.0 - 46.0 % 36.4  32.1  31.4   Platelets 150 - 400 K/uL 802  691  683     Lab Results  Component Value Date   NA 135 06/17/2023   K 3.8 06/17/2023   CL 95 (L) 06/17/2023   CO2 24 06/17/2023       Assessment/Plan: MSSA bacteremia with L5-S1 osteomyelitis/discitis Afebrile-continues to have persistent leukocytosis Repeat blood cultures today Discussed with ID-no need to perform L5-S1 biopsy On IV Ancef TEE will be scheduled by cardiology over the next several days Repeating blood culture today Await further recommendations from infectious disease   Transaminitis Probably due to MSSA bacteremia Acute hepatitis serology negative RUQ ultrasound nonacute Continue to follow LFTs-somewhat slightly worse today-INR stable Continue to hold statin If no improvement-we can consider GI consultation at some point.  HLD Holding statin due to above  GERD:  PPI  Peripheral neuropathy Neurontin  Obesity: Estimated body mass index is 31.63 kg/m as calculated from the following:   Height as of this encounter: 5\' 8"  (1.727 m).   Weight as of this encounter: 94.3 kg.   Code status:   Code Status: Full Code   DVT Prophylaxis: enoxaparin (LOVENOX) injection 40 mg Start: 06/16/23 1030 SCDs Start: 06/15/23 1540   Family Communication: None at bedside   Disposition Plan: Status is: Inpatient Remains inpatient appropriate because: Severity of illness   Planned Discharge Destination:Home   Diet: Diet Order             Diet regular Room service appropriate? Yes; Fluid consistency: Thin  Diet effective now                     Antimicrobial agents: Anti-infectives (From admission, onward)    Start  Setup Time   Final    GRAM POSITIVE COCCI IN BOTH AEROBIC AND ANAEROBIC BOTTLES CRITICAL VALUE NOTED.  VALUE IS CONSISTENT WITH PREVIOUSLY REPORTED AND CALLED VALUE. Performed at [facility], [address] [place], [place] [address]    Culture STAPHYLOCOCCUS AUREUS (A)  Final   Report Status PENDING  Incomplete    RADIOLOGY STUDIES/RESULTS: MR THORACIC SPINE W WO CONTRAST  Result Date: 06/16/2023 CLINICAL DATA:  Mid back pain. Infection suspected at the L5-S1 level. EXAM: MRI THORACIC WITHOUT AND WITH CONTRAST TECHNIQUE: Multiplanar and multiecho pulse sequences of the thoracic spine were obtained without and with intravenous contrast. CONTRAST:  9mL GADAVIST GADOBUTROL 1 MMOL/ML IV SOLN COMPARISON:  Lumbar exam done yesterday. FINDINGS: Alignment:  Normal alignment in the thoracic region. Vertebrae: No evidence of thoracic region fracture or focal bone lesion. Cord: No canal narrowing. No compressive effect upon the cord. No focal cord lesion. Paraspinal and other soft tissues: Negative Disc levels: No disc level finding of significance in the thoracic region. No disc degeneration, bulge or herniation. Incidental and insignificant root sleeve cyst on the left at T11-12. No sign of spinal infection in the thoracic region. IMPRESSION: Negative study. No evidence of spinal infection in the thoracic region. Electronically Signed   By: Paulina Fusi M.D.   On: 06/16/2023 17:32   ECHOCARDIOGRAM COMPLETE  Result Date: 06/16/2023    ECHOCARDIOGRAM REPORT   Patient Name:   Susan [name] [facility] Date of Exam: 06/16/2023 Medical Rec #:  [id number]             Height:       68.0 in Accession #:    [id number]           Weight:       208.0 lb Date of Birth:  01/05/1976            BSA:          2.078 m Patient Age:    47 years             BP:           146/92 mmHg Patient Gender: F                    HR:           83 bpm. Exam Location:  Inpatient Procedure: 2D Echo, Color Doppler and Cardiac Doppler Indications:     Bacteremia  History:         Patient has prior history of Echocardiogram examinations, most                  recent 07/05/2022. Discitis, Ascending Ao Dilation; Risk                  Factors:Dyslipidemia.  Sonographer:     Milbert Coulter Referring Phys:  [id number] Daylene Katayama [facility] Diagnosing Phys: [place] IMPRESSIONS  1. Left ventricular ejection fraction, by estimation, is 60 to 65%. The left ventricle has normal function. The left ventricle has no regional wall motion abnormalities. Left ventricular diastolic parameters were normal.  2. Right ventricular systolic function is normal. The right ventricular size is normal.  3. The mitral valve was not well visualized. Mild mitral valve regurgitation.  4. The RCC appears thickened; c/f bicuspid AV seen on prior echo. The aortic valve is tricuspid. Aortic valve regurgitation is mild.  5. There is mild dilatation of the ascending aorta, measuring 41 mm.  6. The inferior vena  PROGRESS NOTE        PATIENT DETAILS Name: Susan [name] Age: 47 y.o. Sex: female Date of Birth: 1975/11/28 Admit Date: 06/15/2023 Admitting Physician Costin Otelia Sergeant, MD VWU:[id number], [place]elon Mast, [name]  Brief Summary: Patient is a 47 y.o.  female with history of HLD-who presented with intractable back pain following a recent trip to [place] (ongoing since 9/13)-outpatient MRI showed L5-S1 discitis/osteomyelitis-she was then admitted to [facility].  Post admission-blood cultures positive for MSSA.  Significant events: 9/27>> admit to [facility] [address]  Significant studies: 9/27>> MRI lumbar spine: Discitis/osteomyelitis at L5-S1. 9/27>> RUQ ultrasound: S/p cholecystectomy-no acute lesions. 9/28>> MRI thoracic spine: Negative for osteomyelitis. 9/28>> echo: EF 60-65%, bicuspid aortic valve  Significant microbiology data: 9/27>> blood culture: Staph aureus 9/29>> blood culture: Pending  Procedures: None  Consults: ID IR  Subjective: Continues to have some back pain.  No other complaints.  Objective: Vitals: Blood pressure 104/76, pulse 86, temperature 98.1 F (36.7 C), temperature source Oral, resp. rate 20, height 5\' 8"  (1.727 m), weight 94.3 kg, last menstrual period 07/03/2022, SpO2 99%.   Exam: Gen Exam:Alert awake-not in any distress HEENT:atraumatic, normocephalic Chest: B/L clear to auscultation anteriorly CVS:S1S2 regular Abdomen:soft non tender, non distended Extremities:no edema Neurology: Non focal Skin: no rash  Pertinent Labs/Radiology:    Latest Ref Rng & Units 06/17/2023    7:41 AM 06/16/2023    6:12 AM 06/15/2023    1:41 PM  CBC  WBC 4.0 - 10.5 K/uL 15.4  14.8  16.8   Hemoglobin 12.0 - 15.0 g/dL 47.8  29.5  62.1   Hematocrit 36.0 - 46.0 % 36.4  32.1  31.4   Platelets 150 - 400 K/uL 802  691  683     Lab Results  Component Value Date   NA 135 06/17/2023   K 3.8 06/17/2023   CL 95 (L) 06/17/2023   CO2 24 06/17/2023       Assessment/Plan: MSSA bacteremia with L5-S1 osteomyelitis/discitis Afebrile-continues to have persistent leukocytosis Repeat blood cultures today Discussed with ID-no need to perform L5-S1 biopsy On IV Ancef TEE will be scheduled by cardiology over the next several days Repeating blood culture today Await further recommendations from infectious disease   Transaminitis Probably due to MSSA bacteremia Acute hepatitis serology negative RUQ ultrasound nonacute Continue to follow LFTs-somewhat slightly worse today-INR stable Continue to hold statin If no improvement-we can consider GI consultation at some point.  HLD Holding statin due to above  GERD:  PPI  Peripheral neuropathy Neurontin  Obesity: Estimated body mass index is 31.63 kg/m as calculated from the following:   Height as of this encounter: 5\' 8"  (1.727 m).   Weight as of this encounter: 94.3 kg.   Code status:   Code Status: Full Code   DVT Prophylaxis: enoxaparin (LOVENOX) injection 40 mg Start: 06/16/23 1030 SCDs Start: 06/15/23 1540   Family Communication: None at bedside   Disposition Plan: Status is: Inpatient Remains inpatient appropriate because: Severity of illness   Planned Discharge Destination:Home   Diet: Diet Order             Diet regular Room service appropriate? Yes; Fluid consistency: Thin  Diet effective now                     Antimicrobial agents: Anti-infectives (From admission, onward)    Start  PROGRESS NOTE        PATIENT DETAILS Name: Susan [name] Age: 47 y.o. Sex: female Date of Birth: 1975/11/28 Admit Date: 06/15/2023 Admitting Physician Costin Otelia Sergeant, MD VWU:JWJXBJ, [place] Mast, [name]  Brief Summary: Patient is a 47 y.o.  female with history of HLD-who presented with intractable back pain following a recent trip to [place] (ongoing since 9/13)-outpatient MRI showed L5-S1 discitis/osteomyelitis-she was then admitted to [facility].  Post admission-blood cultures positive for MSSA.  Significant events: 9/27>> admit to [facility]  Significant studies: 9/27>> MRI lumbar spine: Discitis/osteomyelitis at L5-S1. 9/27>> RUQ ultrasound: S/p cholecystectomy-no acute lesions. 9/28>> MRI thoracic spine: Negative for osteomyelitis. 9/28>> echo: EF 60-65%, bicuspid aortic valve  Significant microbiology data: 9/27>> blood culture: Staph aureus 9/29>> blood culture: Pending  Procedures: None  Consults: ID IR  Subjective: Continues to have some back pain.  No other complaints.  Objective: Vitals: Blood pressure 104/76, pulse 86, temperature 98.1 F (36.7 C), temperature source Oral, resp. rate 20, height 5\' 8"  (1.727 m), weight 94.3 kg, last menstrual period 07/03/2022, SpO2 99%.   Exam: Gen Exam:Alert awake-not in any distress HEENT:atraumatic, normocephalic Chest: B/L clear to auscultation anteriorly CVS:S1S2 regular Abdomen:soft non tender, non distended Extremities:no edema Neurology: Non focal Skin: no rash  Pertinent Labs/Radiology:    Latest Ref Rng & Units 06/17/2023    7:41 AM 06/16/2023    6:12 AM 06/15/2023    1:41 PM  CBC  WBC 4.0 - 10.5 K/uL 15.4  14.8  16.8   Hemoglobin 12.0 - 15.0 g/dL 47.8  29.5  62.1   Hematocrit 36.0 - 46.0 % 36.4  32.1  31.4   Platelets 150 - 400 K/uL 802  691  683     Lab Results  Component Value Date   NA 135 06/17/2023   K 3.8 06/17/2023   CL 95 (L) 06/17/2023   CO2 24 06/17/2023       Assessment/Plan: MSSA bacteremia with L5-S1 osteomyelitis/discitis Afebrile-continues to have persistent leukocytosis Repeat blood cultures today Discussed with ID-no need to perform L5-S1 biopsy On IV Ancef TEE will be scheduled by cardiology over the next several days Repeating blood culture today Await further recommendations from infectious disease   Transaminitis Probably due to MSSA bacteremia Acute hepatitis serology negative RUQ ultrasound nonacute Continue to follow LFTs-somewhat slightly worse today-INR stable Continue to hold statin If no improvement-we can consider GI consultation at some point.  HLD Holding statin due to above  GERD:  PPI  Peripheral neuropathy Neurontin  Obesity: Estimated body mass index is 31.63 kg/m as calculated from the following:   Height as of this encounter: 5\' 8"  (1.727 m).   Weight as of this encounter: 94.3 kg.   Code status:   Code Status: Full Code   DVT Prophylaxis: enoxaparin (LOVENOX) injection 40 mg Start: 06/16/23 1030 SCDs Start: 06/15/23 1540   Family Communication: None at bedside   Disposition Plan: Status is: Inpatient Remains inpatient appropriate because: Severity of illness   Planned Discharge Destination:Home   Diet: Diet Order             Diet regular Room service appropriate? Yes; Fluid consistency: Thin  Diet effective now                     Antimicrobial agents: Anti-infectives (From admission, onward)    Start

## 2023-06-17 NOTE — Progress Notes (Signed)
    Informed Consent   Shared Decision Making/Informed Consent  The risks [esophageal damage, perforation (1:10,000 risk), bleeding, pharyngeal hematoma as well as other potential complications associated with conscious sedation including aspiration, arrhythmia, respiratory failure and death], benefits (treatment guidance and diagnostic support) and alternatives of a transesophageal echocardiogram were discussed in detail with Susan [name] and she is willing to proceed.    TEE requested for further evaluation of MSSA bacteremia and bicuspid AV. Please remain NPO after midnight, TEE will be scheduled tomorrow, TEE time to be determined. Noted INR 1.1 on 06/17/23. Orders has been placed.

## 2023-06-18 ENCOUNTER — Encounter (HOSPITAL_COMMUNITY)
Admission: EM | Disposition: A | Discharge status: Home / Self Care | Payer: Self-pay | Source: Home / Self Care | Attending: Internal Medicine

## 2023-06-18 ENCOUNTER — Inpatient Hospital Stay (HOSPITAL_COMMUNITY): Payer: BC Managed Care – PPO | Admitting: Anesthesiology

## 2023-06-18 ENCOUNTER — Inpatient Hospital Stay (HOSPITAL_COMMUNITY): Payer: BC Managed Care – PPO

## 2023-06-18 DIAGNOSIS — R7881 Bacteremia: Secondary | ICD-10-CM

## 2023-06-18 DIAGNOSIS — B9561 Methicillin susceptible Staphylococcus aureus infection as the cause of diseases classified elsewhere: Secondary | ICD-10-CM | POA: Diagnosis not present

## 2023-06-18 DIAGNOSIS — M4647 Discitis, unspecified, lumbosacral region: Secondary | ICD-10-CM | POA: Diagnosis not present

## 2023-06-18 DIAGNOSIS — R7401 Elevation of levels of liver transaminase levels: Secondary | ICD-10-CM | POA: Diagnosis not present

## 2023-06-18 DIAGNOSIS — A4901 Methicillin susceptible Staphylococcus aureus infection, unspecified site: Secondary | ICD-10-CM | POA: Diagnosis not present

## 2023-06-18 DIAGNOSIS — K219 Gastro-esophageal reflux disease without esophagitis: Secondary | ICD-10-CM | POA: Diagnosis not present

## 2023-06-18 HISTORY — PX: TEE WITHOUT CARDIOVERSION: SHX5443

## 2023-06-18 LAB — COMPREHENSIVE METABOLIC PANEL
ALT: 140 U/L — ABNORMAL HIGH (ref 0–44)
AST: 96 U/L — ABNORMAL HIGH (ref 15–41)
Albumin: 2.8 g/dL — ABNORMAL LOW (ref 3.5–5.0)
Alkaline Phosphatase: 432 U/L — ABNORMAL HIGH (ref 38–126)
Anion gap: 12 (ref 5–15)
BUN: 13 mg/dL (ref 6–20)
CO2: 26 mmol/L (ref 22–32)
Calcium: 9.5 mg/dL (ref 8.9–10.3)
Chloride: 96 mmol/L — ABNORMAL LOW (ref 98–111)
Creatinine, Ser: 1.24 mg/dL — ABNORMAL HIGH (ref 0.44–1.00)
GFR, Estimated: 54 mL/min — ABNORMAL LOW (ref >60)
Glucose, Bld: 125 mg/dL — ABNORMAL HIGH (ref 70–99)
Potassium: 4.1 mmol/L (ref 3.5–5.1)
Sodium: 134 mmol/L — ABNORMAL LOW (ref 135–145)
Total Bilirubin: 0.9 mg/dL (ref 0.3–1.2)
Total Protein: 7.8 g/dL (ref 6.5–8.1)

## 2023-06-18 LAB — CULTURE, BLOOD (ROUTINE X 2)
Special Requests: ADEQUATE
Special Requests: ADEQUATE

## 2023-06-18 LAB — ECHO TEE
AV Mean grad: 8 mm[Hg]
AV Peak grad: 14.9 mm[Hg]
Ao pk vel: 1.93 m/s

## 2023-06-18 LAB — CBC
HCT: 31.8 % — ABNORMAL LOW (ref 36.0–46.0)
Hemoglobin: 10.2 g/dL — ABNORMAL LOW (ref 12.0–15.0)
MCH: 27.8 pg (ref 26.0–34.0)
MCHC: 32.1 g/dL (ref 30.0–36.0)
MCV: 86.6 fL (ref 80.0–100.0)
Platelets: 820 10*3/uL — ABNORMAL HIGH (ref 150–400)
RBC: 3.67 MIL/uL — ABNORMAL LOW (ref 3.87–5.11)
RDW: 14.3 % (ref 11.5–15.5)
WBC: 13.8 10*3/uL — ABNORMAL HIGH (ref 4.0–10.5)
nRBC: 0 % (ref 0.0–0.2)

## 2023-06-18 SURGERY — ECHOCARDIOGRAM, TRANSESOPHAGEAL
Anesthesia: Monitor Anesthesia Care

## 2023-06-18 MED ORDER — NALOXONE HCL 0.4 MG/ML IJ SOLN: 0.4000 mg | INTRAMUSCULAR | Status: DC | PRN

## 2023-06-18 MED ORDER — LIDOCAINE HCL (CARDIAC) PF 50 MG/5ML IV SOSY
PREFILLED_SYRINGE | INTRAVENOUS | Status: DC | PRN
  Administered 2023-06-18: 100 mg via INTRAVENOUS

## 2023-06-18 MED ORDER — OXYCODONE HCL 5 MG PO TABS
15.0000 mg | ORAL_TABLET | ORAL | Status: DC | PRN
  Administered 2023-06-18 – 2023-06-19 (×4): 15 mg via ORAL
  Filled 2023-06-18 (×4): qty 3

## 2023-06-18 MED ORDER — POLYETHYLENE GLYCOL 3350 17 G PO PACK
17.0000 g | PACK | Freq: Two times a day (BID) | ORAL | Status: DC
  Administered 2023-06-18 – 2023-06-20 (×4): 17 g via ORAL
  Filled 2023-06-18 (×5): qty 1

## 2023-06-18 MED ORDER — SENNA 8.6 MG PO TABS
2.0000 | ORAL_TABLET | Freq: Every day | ORAL | Status: DC
  Filled 2023-06-18 (×2): qty 2

## 2023-06-18 MED ORDER — PROPOFOL 10 MG/ML IV BOLUS
INTRAVENOUS | Status: DC | PRN
  Administered 2023-06-18: 50 mg via INTRAVENOUS
  Administered 2023-06-18: 20 mg via INTRAVENOUS
  Administered 2023-06-18 (×2): 50 mg via INTRAVENOUS
  Administered 2023-06-18: 30 mg via INTRAVENOUS
  Administered 2023-06-18: 80 mg via INTRAVENOUS
  Administered 2023-06-18: 20 mg via INTRAVENOUS

## 2023-06-18 NOTE — Plan of Care (Signed)

## 2023-06-18 NOTE — Progress Notes (Signed)
  Echocardiogram Echocardiogram Transesophageal has been performed.  Delcie Roch 06/18/2023, 2:02 PM

## 2023-06-18 NOTE — Progress Notes (Signed)
[facility]    Date of Admission:  06/15/2023    Total days of antibiotics 3 Day 3 of cefazolin            ID: Susan [name] is a 47 y.o. female with L5-S1 discitis/osteomyelitis with epidural abscess and MSSA bacteremia Principal Problem:   Discitis  Subjective: She is doing well. Her back pain is improved though she notes she is fully utilizing her pain regimen. She denies fevers, chills, sweats. She is able to ambulate and has full control of bowels/bladder (though no BM since Friday), no weakness/numbness of the extremities.   Medications:   [MAR Hold] enoxaparin (LOVENOX) injection  40 mg Subcutaneous Q24H   [MAR Hold] gabapentin  300 mg Oral QHS   [MAR Hold] pantoprazole  40 mg Oral Daily   [MAR Hold] polyethylene glycol  17 g Oral BID   [MAR Hold] senna  2 tablet Oral QHS   [MAR Hold] traZODone  100 mg Oral QHS    Objective: Vital signs in last 24 hours: Temp:  [97.7 F (36.5 C)-98.6 F (37 C)] 97.7 F (36.5 C) (09/30 1149) Pulse Rate:  [85-92] 85 (09/30 1149) Resp:  [13-23] 23 (09/30 1149) BP: (101-135)/(60-93) 135/93 (09/30 1149) SpO2:  [94 %-100 %] 100 % (09/30 1149)   General appearance: alert, cooperative, and no distress Head: Normocephalic, without obvious abnormality, atraumatic Back: symmetric, no curvature. ROM normal. No CVA tenderness., Low back midline and general tenderness Cardio: regular rate and rhythm, S1, S2 normal, no murmur, click, rub or gallop GI: soft, non-tender; bowel sounds normal; no masses,  no organomegaly Extremities: extremities normal, atraumatic, no cyanosis or edema Pulses: 2+ and symmetric Neurologic: Grossly normal  Lab Results Recent Labs    06/17/23 0741 06/18/23 0527  WBC 15.4* 13.8*  HGB 11.7* 10.2*  HCT 36.4 31.8*  NA 135 134*  K 3.8 4.1  CL 95* 96*  CO2 24 26  BUN 11 13  CREATININE 0.90 1.24*   Liver Panel Recent Labs    06/17/23 0741 06/18/23 0527  PROT 8.1 7.8   ALBUMIN 3.1* 2.8*  AST 141* 96*  ALT 197* 140*  ALKPHOS 441* 432*  BILITOT 0.9 0.9   Sedimentation Rate Recent Labs    06/15/23 1401  ESRSEDRATE 110*   C-Reactive Protein Recent Labs    06/15/23 1401  CRP 10.9*    Microbiology:  Studies/Results: MR THORACIC SPINE W WO CONTRAST  Result Date: 06/16/2023 CLINICAL DATA:  Mid back pain. Infection suspected at the L5-S1 level. EXAM: MRI THORACIC WITHOUT AND WITH CONTRAST TECHNIQUE: Multiplanar and multiecho pulse sequences of the thoracic spine were obtained without and with intravenous contrast. CONTRAST:  9mL GADAVIST GADOBUTROL 1 MMOL/ML IV SOLN COMPARISON:  Lumbar exam done yesterday. FINDINGS: Alignment:  Normal alignment in the thoracic region. Vertebrae: No evidence of thoracic region fracture or focal bone lesion. Cord: No canal narrowing. No compressive effect upon the cord. No focal cord lesion. Paraspinal and other soft tissues: Negative Disc levels: No disc level finding of significance in the thoracic region. No disc degeneration, bulge or herniation. Incidental and insignificant root sleeve cyst on the left at T11-12. No sign of spinal infection in the thoracic region. IMPRESSION: Negative study. No evidence of spinal infection in the thoracic region. Electronically Signed   By: Paulina Fusi M.D.   On: 06/16/2023 17:32   ECHOCARDIOGRAM COMPLETE  Result Date: 06/16/2023    ECHOCARDIOGRAM REPORT   Patient Name:  Susan [name] [facility] Date of Exam: 06/16/2023 Medical Rec #:  [id number]            Height:       68.0 in Accession #:    [id number]           Weight:       208.0 lb Date of Birth:  1976/03/15            BSA:          2.078 m Patient Age:    47 years             BP:           146/92 mmHg Patient Gender: F                    HR:           83 bpm. Exam Location:  Inpatient Procedure: 2D Echo, Color Doppler and Cardiac Doppler Indications:     Bacteremia  History:         Patient has prior history of Echocardiogram  examinations, most                  recent 07/05/2022. Discitis, Ascending Ao Dilation; Risk                  Factors:Dyslipidemia.  Sonographer:     Milbert Coulter Referring Phys:  [id number]ama [facility] Diagnosing Phys: [place] IMPRESSIONS  1. Left ventricular ejection fraction, by estimation, is 60 to 65%. The left ventricle has normal function. The left ventricle has no regional wall motion abnormalities. Left ventricular diastolic parameters were normal.  2. Right ventricular systolic function is normal. The right ventricular size is normal.  3. The mitral valve was not well visualized. Mild mitral valve regurgitation.  4. The RCC appears thickened; c/f bicuspid AV seen on prior echo. The aortic valve is tricuspid. Aortic valve regurgitation is mild.  5. There is mild dilatation of the ascending aorta, measuring 41 mm.  6. The inferior vena cava not well visualized. FINDINGS  Left Ventricle: Left ventricular ejection fraction, by estimation, is 60 to 65%. The left ventricle has normal function. The left ventricle has no regional wall motion abnormalities. The left ventricular internal cavity size was normal in size. There is  no left ventricular hypertrophy. Left ventricular diastolic parameters were normal. Right Ventricle: The right ventricular size is normal. Right ventricular systolic function is normal. Left Atrium: Left atrial size was normal in size. Right Atrium: Right atrial size was normal in size. Pericardium: There is no evidence of pericardial effusion. Mitral Valve: The mitral valve was not well visualized. Mild mitral valve regurgitation. Tricuspid Valve: Tricuspid valve regurgitation is not demonstrated. Aortic Valve: The RCC appears thickened; c/f bicuspid AV seen on prior echo. The aortic valve is tricuspid. Aortic valve regurgitation is mild. Aortic valve mean gradient measures 8.0 mmHg. Aortic valve peak gradient measures 14.6 mmHg. Aortic valve area, by VTI measures 2.62 cm. Pulmonic  Valve: Pulmonic valve regurgitation is not visualized. Aorta: There is mild dilatation of the ascending aorta, measuring 41 mm. Venous: The inferior vena cava not well visualized. IAS/Shunts: No atrial level shunt detected by color flow Doppler.  LEFT VENTRICLE PLAX 2D LVIDd:         4.00 cm   Diastology LVIDs:         2.50 cm   LV e' medial:    7.94 cm/s LV PW:  0.90 cm   LV E/e' medial:  14.9 LV IVS:        1.00 cm   LV e' lateral:   12.50 cm/s LVOT diam:     2.20 cm   LV E/e' lateral: 9.4 LV SV:         88 LV SV Index:   42 LVOT Area:     3.80 cm  RIGHT VENTRICLE RV Basal diam:  2.90 cm RV Mid diam:    2.80 cm RV S prime:     15.60 cm/s TAPSE (M-mode): 1.7 cm LEFT ATRIUM             Index        RIGHT ATRIUM           Index LA diam:        2.60 cm 1.25 cm/m   RA Area:     10.00 cm LA Vol (A2C):   33.3 ml 16.02 ml/m  RA Volume:   19.90 ml  9.58 ml/m LA Vol (A4C):   33.1 ml 15.93 ml/m LA Biplane Vol: 34.3 ml 16.51 ml/m  AORTIC VALVE AV Area (Vmax):    2.43 cm AV Area (Vmean):   2.42 cm AV Area (VTI):     2.62 cm AV Vmax:           191.00 cm/s AV Vmean:          127.000 cm/s AV VTI:            0.337 m AV Peak Grad:      14.6 mmHg AV Mean Grad:      8.0 mmHg LVOT Vmax:         122.00 cm/s LVOT Vmean:        80.700 cm/s LVOT VTI:          0.232 m LVOT/AV VTI ratio: 0.69  AORTA Ao Root diam: 3.30 cm Ao Asc diam:  4.10 cm MITRAL VALVE                TRICUSPID VALVE MV Area (PHT): 3.54 cm     TR Peak grad:   27.2 mmHg MV Decel Time: 214 msec     TR Vmax:        261.00 cm/s MV E velocity: 118.00 cm/s MV A velocity: 101.00 cm/s  SHUNTS MV E/A ratio:  1.17         Systemic VTI:  0.23 m                             Systemic Diam: 2.20 cm Carolan Clines Electronically signed by Carolan Clines Signature Date/Time: 06/16/2023/4:20:23 PM    Final (Updated)     Assessment/Plan: This patient has L5-S1 discitis/osteomyelitis with epidural abscess and MSSA bacteremia. She has a history of HLD. She originally presented  with back pain ongoing since a fall in early September while visiting [place].  L5-S1 discitis/osteomyelitis with epidural abscess and MSSA bacteremia - Continue cefazolin now on day 3, likely 6-8 weeks total course - Leukocytosis is downtrending, afebrile - Blood cultures on 9/27 with MSSA, redrawn yesterday with no growth at 24 hours - TEE is planned for today - Consider repeat lumbar MRI for response to therapy - Will follow up on TEE and blood cultures - then PICC line and dispo  Katheran James, [facility] Internal Medicine Resident PGY-1 Pager: [phone]  06/18/2023, 12:19 PM

## 2023-06-18 NOTE — Anesthesia Postprocedure Evaluation (Signed)
Anesthesia Post Note  Patient: Susan [name]  Procedure(s) Performed: TRANSESOPHAGEAL ECHOCARDIOGRAM     Patient location during evaluation: PACU Anesthesia Type: MAC Level of consciousness: awake and alert Pain management: pain level controlled Vital Signs Assessment: post-procedure vital signs reviewed and stable Respiratory status: spontaneous breathing, nonlabored ventilation and respiratory function stable Cardiovascular status: stable and blood pressure returned to baseline Anesthetic complications: no   No notable events documented.  Last Vitals:  Vitals:   06/18/23 1323 06/18/23 1330  BP: 107/76 111/83  Pulse:    Resp: (!) 26 (!) 26  Temp:    SpO2: 100% 100%    Last Pain:  Vitals:   06/18/23 1313  TempSrc: Skin  PainSc: 0-No pain                 [name]

## 2023-06-18 NOTE — Interval H&P Note (Signed)
History and Physical Interval Note:  06/18/2023 12:34 PM  Susan [name]  has presented today for surgery, with the diagnosis of bacteremia.  The various methods of treatment have been discussed with the patient and family. After consideration of risks, benefits and other options for treatment, the patient has consented to  Procedure(s): TRANSESOPHAGEAL ECHOCARDIOGRAM (N/A) as a surgical intervention.  The patient's history has been reviewed, patient examined, no change in status, stable for surgery.  I have reviewed the patient's chart and labs.  Questions were answered to the patient's satisfaction.     Charlton Haws

## 2023-06-18 NOTE — Anesthesia Preprocedure Evaluation (Addendum)
Anesthesia Evaluation  Patient identified by MRN, date of birth, ID band Patient awake    Reviewed: Allergy & Precautions, NPO status , Patient's Chart, lab work & pertinent test results  History of Anesthesia Complications Negative for: history of anesthetic complications  Airway Mallampati: II  TM Distance: >3 FB Neck ROM: Full    Dental  (+) Dental Advisory Given, Chipped   Pulmonary neg pulmonary ROS   Pulmonary exam normal        Cardiovascular Normal cardiovascular exam   '24 TTE - EF 60 to 65%. Mild mitral valve regurgitation. The RCC appears thickened; c/f bicuspid AV seen on prior echo. The aortic valve is tricuspid. Aortic valve regurgitation is mild. There is mild dilatation of the ascending aorta, measuring 41 mm.     Neuro/Psych  Headaches  Neuromuscular disease  negative psych ROS   GI/Hepatic Neg liver ROS,GERD  Controlled and Medicated,,  Endo/Other   Obesity   Renal/GU Renal InsufficiencyRenal disease     Musculoskeletal  (+) Arthritis ,    Abdominal   Peds  Hematology  (+) Blood dyscrasia, anemia   Anesthesia Other Findings   Reproductive/Obstetrics                             Anesthesia Physical Anesthesia Plan  ASA: 3  Anesthesia Plan: MAC   Post-op Pain Management: Minimal or no pain anticipated   Induction:   PONV Risk Score and Plan: 2 and Propofol infusion and Treatment may vary due to age or medical condition  Airway Management Planned: Nasal Cannula and Natural Airway  Additional Equipment: None  Intra-op Plan:   Post-operative Plan:   Informed Consent: I have reviewed the patients History and Physical, chart, labs and discussed the procedure including the risks, benefits and alternatives for the proposed anesthesia with the patient or authorized representative who has indicated his/her understanding and acceptance.       Plan Discussed with:  CRNA and Anesthesiologist  Anesthesia Plan Comments:         Anesthesia Quick Evaluation

## 2023-06-18 NOTE — Plan of Care (Signed)
  Problem: Education: Goal: Knowledge of General Education information will improve Description: Including pain rating scale, medication(s)/side effects and non-pharmacologic comfort measures Outcome: Progressing   Problem: Clinical Measurements: Goal: Ability to maintain clinical measurements within normal limits will improve Outcome: Progressing Goal: Will remain free from infection Outcome: Progressing   Problem: Activity: Goal: Risk for activity intolerance will decrease Outcome: Progressing   Problem: Pain Managment: Goal: General experience of comfort will improve Outcome: Progressing   Problem: Safety: Goal: Ability to remain free from injury will improve Outcome: Progressing   

## 2023-06-18 NOTE — Progress Notes (Signed)
PROGRESS NOTE        PATIENT DETAILS Name: Susan [name] Age: 47 y.o. Sex: female Date of Birth: [birth year] Admit Date: 06/15/2023 Admitting Physician Costin Otelia Sergeant, MD RUE:AVWUJW, Lelon Mast, [name]  Brief Summary: Patient is a 47 y.o.  female with history of HLD-who presented with intractable back pain following a recent trip to [place] (ongoing since 9/13)-outpatient MRI showed L5-S1 discitis/osteomyelitis-she was then admitted to [facility].  Post admission-blood cultures positive for MSSA.  Significant events: 9/27>> admit to [facility]  Significant studies: 9/27>> MRI lumbar spine: Discitis/osteomyelitis at L5-S1. 9/27>> RUQ ultrasound: S/p cholecystectomy-no acute lesions. 9/28>> MRI thoracic spine: Negative for osteomyelitis. 9/28>> echo: EF 60-65%, bicuspid aortic valve  Significant microbiology data: 9/27>> blood culture: MSSA 9/29>> blood culture: Pending  Procedures: None  Consults: ID IR  Subjective: Continue to have back pain requiring IV medications-as oral medications still do not provide appropriate relief.  Objective: Vitals: Blood pressure 113/73, pulse 88, temperature 98.2 F (36.8 C), temperature source Oral, resp. rate 16, height 5\' 8"  (1.727 m), weight 94.3 kg, last menstrual period 07/03/2022, SpO2 94%.   Exam: Gen Exam:Alert awake-not in any distress HEENT:atraumatic, normocephalic Chest: B/L clear to auscultation anteriorly CVS:S1S2 regular Abdomen:soft non tender, non distended Extremities:no edema Neurology: Non focal Skin: no rash  Pertinent Labs/Radiology:    Latest Ref Rng & Units 06/18/2023    5:27 AM 06/17/2023    7:41 AM 06/16/2023    6:12 AM  CBC  WBC 4.0 - 10.5 K/uL 13.8  15.4  14.8   Hemoglobin 12.0 - 15.0 g/dL 11.9  14.7  82.9   Hematocrit 36.0 - 46.0 % 31.8  36.4  32.1   Platelets 150 - 400 K/uL 820  802  691     Lab Results  Component Value Date   NA 134 (L) 06/18/2023   K 4.1 06/18/2023   CL 96  (L) 06/18/2023   CO2 26 06/18/2023      Assessment/Plan: MSSA bacteremia with L5-S1 osteomyelitis/discitis Afebrile-leukocytosis slowly trending down Repeat blood cultures 9/29 pending TEE scheduled today Per ID-no need to perform L5-S1 biopsy-as we already have an organism Continues to have significant back pain-increase oxycodone to 15 mg every 4 as needed-an attempt to minimize IV medications as much as possible. ID following.  Transaminitis Probably due to MSSA bacteremia Acute hepatitis serology negative RUQ ultrasound nonacute LFTs downtrending Continue to hold statin  Thrombocytosis Secondary to inflammation from bacteremia Supportive care-follow CBC periodically  HLD Holding statin due to above  GERD:  PPI  Peripheral neuropathy Neurontin  Constipation Likely due to narcotics Change MiraLAX to twice daily Continue senna Dulcolax suppository x 1 today If no response-needs enema  Obesity: Estimated body mass index is 31.63 kg/m as calculated from the following:   Height as of this encounter: 5\' 8"  (1.727 m).   Weight as of this encounter: 94.3 kg.   Code status:   Code Status: Full Code   DVT Prophylaxis: enoxaparin (LOVENOX) injection 40 mg Start: 06/16/23 1030 SCDs Start: 06/15/23 1540   Family Communication: None at bedside   Disposition Plan: Status is: Inpatient Remains inpatient appropriate because: Severity of illness   Planned Discharge Destination:Home   Diet: Diet Order             Diet NPO time specified Except for: Sips with Meds  Diet effective now  PROGRESS NOTE        PATIENT DETAILS Name: Susan [name] Age: 47 y.o. Sex: female Date of Birth: [birth year] Admit Date: 06/15/2023 Admitting Physician Costin Otelia Sergeant, MD RUE:AVWUJW, Lelon Mast, [name]  Brief Summary: Patient is a 47 y.o.  female with history of HLD-who presented with intractable back pain following a recent trip to [place] (ongoing since 9/13)-outpatient MRI showed L5-S1 discitis/osteomyelitis-she was then admitted to [facility].  Post admission-blood cultures positive for MSSA.  Significant events: 9/27>> admit to [facility]  Significant studies: 9/27>> MRI lumbar spine: Discitis/osteomyelitis at L5-S1. 9/27>> RUQ ultrasound: S/p cholecystectomy-no acute lesions. 9/28>> MRI thoracic spine: Negative for osteomyelitis. 9/28>> echo: EF 60-65%, bicuspid aortic valve  Significant microbiology data: 9/27>> blood culture: MSSA 9/29>> blood culture: Pending  Procedures: None  Consults: ID IR  Subjective: Continue to have back pain requiring IV medications-as oral medications still do not provide appropriate relief.  Objective: Vitals: Blood pressure 113/73, pulse 88, temperature 98.2 F (36.8 C), temperature source Oral, resp. rate 16, height 5\' 8"  (1.727 m), weight 94.3 kg, last menstrual period 07/03/2022, SpO2 94%.   Exam: Gen Exam:Alert awake-not in any distress HEENT:atraumatic, normocephalic Chest: B/L clear to auscultation anteriorly CVS:S1S2 regular Abdomen:soft non tender, non distended Extremities:no edema Neurology: Non focal Skin: no rash  Pertinent Labs/Radiology:    Latest Ref Rng & Units 06/18/2023    5:27 AM 06/17/2023    7:41 AM 06/16/2023    6:12 AM  CBC  WBC 4.0 - 10.5 K/uL 13.8  15.4  14.8   Hemoglobin 12.0 - 15.0 g/dL 11.9  14.7  82.9   Hematocrit 36.0 - 46.0 % 31.8  36.4  32.1   Platelets 150 - 400 K/uL 820  802  691     Lab Results  Component Value Date   NA 134 (L) 06/18/2023   K 4.1 06/18/2023   CL 96  (L) 06/18/2023   CO2 26 06/18/2023      Assessment/Plan: MSSA bacteremia with L5-S1 osteomyelitis/discitis Afebrile-leukocytosis slowly trending down Repeat blood cultures 9/29 pending TEE scheduled today Per ID-no need to perform L5-S1 biopsy-as we already have an organism Continues to have significant back pain-increase oxycodone to 15 mg every 4 as needed-an attempt to minimize IV medications as much as possible. ID following.  Transaminitis Probably due to MSSA bacteremia Acute hepatitis serology negative RUQ ultrasound nonacute LFTs downtrending Continue to hold statin  Thrombocytosis Secondary to inflammation from bacteremia Supportive care-follow CBC periodically  HLD Holding statin due to above  GERD:  PPI  Peripheral neuropathy Neurontin  Constipation Likely due to narcotics Change MiraLAX to twice daily Continue senna Dulcolax suppository x 1 today If no response-needs enema  Obesity: Estimated body mass index is 31.63 kg/m as calculated from the following:   Height as of this encounter: 5\' 8"  (1.727 m).   Weight as of this encounter: 94.3 kg.   Code status:   Code Status: Full Code   DVT Prophylaxis: enoxaparin (LOVENOX) injection 40 mg Start: 06/16/23 1030 SCDs Start: 06/15/23 1540   Family Communication: None at bedside   Disposition Plan: Status is: Inpatient Remains inpatient appropriate because: Severity of illness   Planned Discharge Destination:Home   Diet: Diet Order             Diet NPO time specified Except for: Sips with Meds  Diet effective now  PROGRESS NOTE        PATIENT DETAILS Name: Susan [name] Age: 47 y.o. Sex: female Date of Birth: [birth year] Admit Date: 06/15/2023 Admitting Physician Costin Otelia Sergeant, MD RUE:AVWUJW, Lelon Mast, [name]  Brief Summary: Patient is a 47 y.o.  female with history of HLD-who presented with intractable back pain following a recent trip to [place] (ongoing since 9/13)-outpatient MRI showed L5-S1 discitis/osteomyelitis-she was then admitted to [facility].  Post admission-blood cultures positive for MSSA.  Significant events: 9/27>> admit to [facility]  Significant studies: 9/27>> MRI lumbar spine: Discitis/osteomyelitis at L5-S1. 9/27>> RUQ ultrasound: S/p cholecystectomy-no acute lesions. 9/28>> MRI thoracic spine: Negative for osteomyelitis. 9/28>> echo: EF 60-65%, bicuspid aortic valve  Significant microbiology data: 9/27>> blood culture: MSSA 9/29>> blood culture: Pending  Procedures: None  Consults: ID IR  Subjective: Continue to have back pain requiring IV medications-as oral medications still do not provide appropriate relief.  Objective: Vitals: Blood pressure 113/73, pulse 88, temperature 98.2 F (36.8 C), temperature source Oral, resp. rate 16, height 5\' 8"  (1.727 m), weight 94.3 kg, last menstrual period 07/03/2022, SpO2 94%.   Exam: Gen Exam:Alert awake-not in any distress HEENT:atraumatic, normocephalic Chest: B/L clear to auscultation anteriorly CVS:S1S2 regular Abdomen:soft non tender, non distended Extremities:no edema Neurology: Non focal Skin: no rash  Pertinent Labs/Radiology:    Latest Ref Rng & Units 06/18/2023    5:27 AM 06/17/2023    7:41 AM 06/16/2023    6:12 AM  CBC  WBC 4.0 - 10.5 K/uL 13.8  15.4  14.8   Hemoglobin 12.0 - 15.0 g/dL 11.9  14.7  82.9   Hematocrit 36.0 - 46.0 % 31.8  36.4  32.1   Platelets 150 - 400 K/uL 820  802  691     Lab Results  Component Value Date   NA 134 (L) 06/18/2023   K 4.1 06/18/2023   CL 96  (L) 06/18/2023   CO2 26 06/18/2023      Assessment/Plan: MSSA bacteremia with L5-S1 osteomyelitis/discitis Afebrile-leukocytosis slowly trending down Repeat blood cultures 9/29 pending TEE scheduled today Per ID-no need to perform L5-S1 biopsy-as we already have an organism Continues to have significant back pain-increase oxycodone to 15 mg every 4 as needed-an attempt to minimize IV medications as much as possible. ID following.  Transaminitis Probably due to MSSA bacteremia Acute hepatitis serology negative RUQ ultrasound nonacute LFTs downtrending Continue to hold statin  Thrombocytosis Secondary to inflammation from bacteremia Supportive care-follow CBC periodically  HLD Holding statin due to above  GERD:  PPI  Peripheral neuropathy Neurontin  Constipation Likely due to narcotics Change MiraLAX to twice daily Continue senna Dulcolax suppository x 1 today If no response-needs enema  Obesity: Estimated body mass index is 31.63 kg/m as calculated from the following:   Height as of this encounter: 5\' 8"  (1.727 m).   Weight as of this encounter: 94.3 kg.   Code status:   Code Status: Full Code   DVT Prophylaxis: enoxaparin (LOVENOX) injection 40 mg Start: 06/16/23 1030 SCDs Start: 06/15/23 1540   Family Communication: None at bedside   Disposition Plan: Status is: Inpatient Remains inpatient appropriate because: Severity of illness   Planned Discharge Destination:Home   Diet: Diet Order             Diet NPO time specified Except for: Sips with Meds  Diet effective now  PROGRESS NOTE        PATIENT DETAILS Name: Susan [name] Age: 47 y.o. Sex: female Date of Birth: [birth year] Admit Date: 06/15/2023 Admitting Physician Costin Otelia Sergeant, MD RUE:AVWUJW, Lelon Mast, [name]  Brief Summary: Patient is a 47 y.o.  female with history of HLD-who presented with intractable back pain following a recent trip to [place] (ongoing since 9/13)-outpatient MRI showed L5-S1 discitis/osteomyelitis-she was then admitted to [facility].  Post admission-blood cultures positive for MSSA.  Significant events: 9/27>> admit to [facility]  Significant studies: 9/27>> MRI lumbar spine: Discitis/osteomyelitis at L5-S1. 9/27>> RUQ ultrasound: S/p cholecystectomy-no acute lesions. 9/28>> MRI thoracic spine: Negative for osteomyelitis. 9/28>> echo: EF 60-65%, bicuspid aortic valve  Significant microbiology data: 9/27>> blood culture: MSSA 9/29>> blood culture: Pending  Procedures: None  Consults: ID IR  Subjective: Continue to have back pain requiring IV medications-as oral medications still do not provide appropriate relief.  Objective: Vitals: Blood pressure 113/73, pulse 88, temperature 98.2 F (36.8 C), temperature source Oral, resp. rate 16, height 5\' 8"  (1.727 m), weight 94.3 kg, last menstrual period 07/03/2022, SpO2 94%.   Exam: Gen Exam:Alert awake-not in any distress HEENT:atraumatic, normocephalic Chest: B/L clear to auscultation anteriorly CVS:S1S2 regular Abdomen:soft non tender, non distended Extremities:no edema Neurology: Non focal Skin: no rash  Pertinent Labs/Radiology:    Latest Ref Rng & Units 06/18/2023    5:27 AM 06/17/2023    7:41 AM 06/16/2023    6:12 AM  CBC  WBC 4.0 - 10.5 K/uL 13.8  15.4  14.8   Hemoglobin 12.0 - 15.0 g/dL 11.9  14.7  82.9   Hematocrit 36.0 - 46.0 % 31.8  36.4  32.1   Platelets 150 - 400 K/uL 820  802  691     Lab Results  Component Value Date   NA 134 (L) 06/18/2023   K 4.1 06/18/2023   CL 96  (L) 06/18/2023   CO2 26 06/18/2023      Assessment/Plan: MSSA bacteremia with L5-S1 osteomyelitis/discitis Afebrile-leukocytosis slowly trending down Repeat blood cultures 9/29 pending TEE scheduled today Per ID-no need to perform L5-S1 biopsy-as we already have an organism Continues to have significant back pain-increase oxycodone to 15 mg every 4 as needed-an attempt to minimize IV medications as much as possible. ID following.  Transaminitis Probably due to MSSA bacteremia Acute hepatitis serology negative RUQ ultrasound nonacute LFTs downtrending Continue to hold statin  Thrombocytosis Secondary to inflammation from bacteremia Supportive care-follow CBC periodically  HLD Holding statin due to above  GERD:  PPI  Peripheral neuropathy Neurontin  Constipation Likely due to narcotics Change MiraLAX to twice daily Continue senna Dulcolax suppository x 1 today If no response-needs enema  Obesity: Estimated body mass index is 31.63 kg/m as calculated from the following:   Height as of this encounter: 5\' 8"  (1.727 m).   Weight as of this encounter: 94.3 kg.   Code status:   Code Status: Full Code   DVT Prophylaxis: enoxaparin (LOVENOX) injection 40 mg Start: 06/16/23 1030 SCDs Start: 06/15/23 1540   Family Communication: None at bedside   Disposition Plan: Status is: Inpatient Remains inpatient appropriate because: Severity of illness   Planned Discharge Destination:Home   Diet: Diet Order             Diet NPO time specified Except for: Sips with Meds  Diet effective now  NOT DETECTED Final   Candida glabrata NOT DETECTED NOT DETECTED Final   Candida krusei NOT DETECTED NOT DETECTED Final   Candida parapsilosis NOT DETECTED NOT DETECTED Final   Candida tropicalis NOT DETECTED NOT DETECTED Final   Cryptococcus neoformans/gattii NOT DETECTED NOT DETECTED Final   Meth resistant mecA/C and MREJ NOT DETECTED NOT DETECTED Final    Comment: Performed at [facility], [address], [place], [place] [address]  Culture, blood (routine x 2)     Status: Abnormal   Collection Time: 06/15/23  1:41 PM   Specimen: BLOOD RIGHT ARM  Result Value Ref Range Status   Specimen Description   Final    BLOOD RIGHT ARM Performed at [facility], [address] [place], [place] [address]    Special Requests   Final    BOTTLES DRAWN AEROBIC AND ANAEROBIC Blood Culture adequate volume Performed at [facility], [address], [place], [place] [address]    Culture  Setup Time   Final    GRAM POSITIVE COCCI IN BOTH AEROBIC AND ANAEROBIC BOTTLES CRITICAL VALUE NOTED.  VALUE IS CONSISTENT WITH PREVIOUSLY REPORTED AND CALLED VALUE.    Culture (A)  Final    STAPHYLOCOCCUS AUREUS SUSCEPTIBILITIES PERFORMED ON PREVIOUS CULTURE WITHIN THE LAST 5 DAYS. Performed at [facility], [address], [place], [place] [address]    Report Status 06/18/2023 FINAL  Final  Culture, blood (Routine X 2) w Reflex to ID Panel     Status: None (Preliminary result)   Collection Time: 06/17/23  7:41 AM   Specimen: BLOOD  Result Value Ref Range Status    Specimen Description BLOOD SITE NOT SPECIFIED  Final   Special Requests   Final    BOTTLES DRAWN AEROBIC AND ANAEROBIC Blood Culture adequate volume   Culture   Final    NO GROWTH < 24 HOURS Performed at [facility], [address] [place], [place] [address]    Report Status PENDING  Incomplete    RADIOLOGY STUDIES/RESULTS: MR THORACIC SPINE W WO CONTRAST  Result Date: 06/16/2023 CLINICAL DATA:  Mid back pain. Infection suspected at the L5-S1 level. EXAM: MRI THORACIC WITHOUT AND WITH CONTRAST TECHNIQUE: Multiplanar and multiecho pulse sequences of the thoracic spine were obtained without and with intravenous contrast. CONTRAST:  9mL GADAVIST GADOBUTROL 1 MMOL/ML IV SOLN COMPARISON:  Lumbar exam done yesterday. FINDINGS: Alignment:  Normal alignment in the thoracic region. Vertebrae: No evidence of thoracic region fracture or focal bone lesion. Cord: No canal narrowing. No compressive effect upon the cord. No focal cord lesion. Paraspinal and other soft tissues: Negative Disc levels: No disc level finding of significance in the thoracic region. No disc degeneration, bulge or herniation. Incidental and insignificant root sleeve cyst on the left at T11-12. No sign of spinal infection in the thoracic region. IMPRESSION: Negative study. No evidence of spinal infection in the thoracic region. Electronically Signed   By: Paulina Fusi M.D.   On: 06/16/2023 17:32   ECHOCARDIOGRAM COMPLETE  Result Date: 06/16/2023    ECHOCARDIOGRAM REPORT   Patient Name:   DORYCE MCGREGORY [facility] Date of Exam: 06/16/2023 Medical Rec #:  [id number]            Height:       68.0 in Accession #:    [id number]           Weight:       208.0 lb Date of Birth:  18-Feb-1976  NOT DETECTED Final   Candida glabrata NOT DETECTED NOT DETECTED Final   Candida krusei NOT DETECTED NOT DETECTED Final   Candida parapsilosis NOT DETECTED NOT DETECTED Final   Candida tropicalis NOT DETECTED NOT DETECTED Final   Cryptococcus neoformans/gattii NOT DETECTED NOT DETECTED Final   Meth resistant mecA/C and MREJ NOT DETECTED NOT DETECTED Final    Comment: Performed at [facility], [address], [place], [place] [address]  Culture, blood (routine x 2)     Status: Abnormal   Collection Time: 06/15/23  1:41 PM   Specimen: BLOOD RIGHT ARM  Result Value Ref Range Status   Specimen Description   Final    BLOOD RIGHT ARM Performed at [facility], [address] [place], [place] [address]    Special Requests   Final    BOTTLES DRAWN AEROBIC AND ANAEROBIC Blood Culture adequate volume Performed at [facility], [address], [place], [place] [address]    Culture  Setup Time   Final    GRAM POSITIVE COCCI IN BOTH AEROBIC AND ANAEROBIC BOTTLES CRITICAL VALUE NOTED.  VALUE IS CONSISTENT WITH PREVIOUSLY REPORTED AND CALLED VALUE.    Culture (A)  Final    STAPHYLOCOCCUS AUREUS SUSCEPTIBILITIES PERFORMED ON PREVIOUS CULTURE WITHIN THE LAST 5 DAYS. Performed at [facility], [address], [place], [place] [address]    Report Status 06/18/2023 FINAL  Final  Culture, blood (Routine X 2) w Reflex to ID Panel     Status: None (Preliminary result)   Collection Time: 06/17/23  7:41 AM   Specimen: BLOOD  Result Value Ref Range Status    Specimen Description BLOOD SITE NOT SPECIFIED  Final   Special Requests   Final    BOTTLES DRAWN AEROBIC AND ANAEROBIC Blood Culture adequate volume   Culture   Final    NO GROWTH < 24 HOURS Performed at [facility], [address] [place], [place] [address]    Report Status PENDING  Incomplete    RADIOLOGY STUDIES/RESULTS: MR THORACIC SPINE W WO CONTRAST  Result Date: 06/16/2023 CLINICAL DATA:  Mid back pain. Infection suspected at the L5-S1 level. EXAM: MRI THORACIC WITHOUT AND WITH CONTRAST TECHNIQUE: Multiplanar and multiecho pulse sequences of the thoracic spine were obtained without and with intravenous contrast. CONTRAST:  9mL GADAVIST GADOBUTROL 1 MMOL/ML IV SOLN COMPARISON:  Lumbar exam done yesterday. FINDINGS: Alignment:  Normal alignment in the thoracic region. Vertebrae: No evidence of thoracic region fracture or focal bone lesion. Cord: No canal narrowing. No compressive effect upon the cord. No focal cord lesion. Paraspinal and other soft tissues: Negative Disc levels: No disc level finding of significance in the thoracic region. No disc degeneration, bulge or herniation. Incidental and insignificant root sleeve cyst on the left at T11-12. No sign of spinal infection in the thoracic region. IMPRESSION: Negative study. No evidence of spinal infection in the thoracic region. Electronically Signed   By: Paulina Fusi M.D.   On: 06/16/2023 17:32   ECHOCARDIOGRAM COMPLETE  Result Date: 06/16/2023    ECHOCARDIOGRAM REPORT   Patient Name:   DORYCE MCGREGORY [facility] Date of Exam: 06/16/2023 Medical Rec #:  [id number]            Height:       68.0 in Accession #:    [id number]           Weight:       208.0 lb Date of Birth:  18-Feb-1976

## 2023-06-18 NOTE — Transfer of Care (Signed)
Immediate Anesthesia Transfer of Care Note  Patient: Susan [name]  Procedure(s) Performed: TRANSESOPHAGEAL ECHOCARDIOGRAM  Patient Location: PACU and Cath Lab  Anesthesia Type:MAC  Level of Consciousness: drowsy, patient cooperative, and responds to stimulation  Airway & Oxygen Therapy: Patient Spontanous Breathing and Patient connected to nasal cannula oxygen  Post-op Assessment: Report given to RN and Post -op Vital signs reviewed and stable  Post vital signs: Reviewed and stable  Last Vitals:  Vitals Value Taken Time  BP    Temp    Pulse    Resp    SpO2      Last Pain:  Vitals:   06/18/23 1149  TempSrc: Temporal  PainSc:       Patients Stated Pain Goal: 2 (06/17/23 1406)  Complications: No notable events documented.

## 2023-06-18 NOTE — CV Procedure (Signed)
TEE: Anesthesia:  Propofol  Bicuspid AV Sievers 1 with fused right/left cusp trivial AR Trivial MR Mild TR Trivial PR No evidence of SBE/vegetations Mild aortic root dilatation 3.8 cm  No LAA thrombus No PFO No effusion   See full report in [employer]  Charlton Haws MD [facility]

## 2023-06-19 ENCOUNTER — Encounter (HOSPITAL_COMMUNITY): Payer: Self-pay | Admitting: Cardiovascular Disease

## 2023-06-19 ENCOUNTER — Other Ambulatory Visit: Payer: Self-pay

## 2023-06-19 DIAGNOSIS — E440 Moderate protein-calorie malnutrition: Secondary | ICD-10-CM | POA: Insufficient documentation

## 2023-06-19 DIAGNOSIS — B9561 Methicillin susceptible Staphylococcus aureus infection as the cause of diseases classified elsewhere: Secondary | ICD-10-CM | POA: Diagnosis not present

## 2023-06-19 DIAGNOSIS — M4647 Discitis, unspecified, lumbosacral region: Secondary | ICD-10-CM | POA: Diagnosis not present

## 2023-06-19 DIAGNOSIS — R7881 Bacteremia: Secondary | ICD-10-CM | POA: Diagnosis not present

## 2023-06-19 DIAGNOSIS — A4901 Methicillin susceptible Staphylococcus aureus infection, unspecified site: Secondary | ICD-10-CM | POA: Diagnosis not present

## 2023-06-19 LAB — COMPREHENSIVE METABOLIC PANEL
ALT: 96 U/L — ABNORMAL HIGH (ref 0–44)
AST: 63 U/L — ABNORMAL HIGH (ref 15–41)
Albumin: 2.7 g/dL — ABNORMAL LOW (ref 3.5–5.0)
Alkaline Phosphatase: 384 U/L — ABNORMAL HIGH (ref 38–126)
Anion gap: 12 (ref 5–15)
BUN: 11 mg/dL (ref 6–20)
CO2: 27 mmol/L (ref 22–32)
Calcium: 9.5 mg/dL (ref 8.9–10.3)
Chloride: 97 mmol/L — ABNORMAL LOW (ref 98–111)
Creatinine, Ser: 1.04 mg/dL — ABNORMAL HIGH (ref 0.44–1.00)
GFR, Estimated: >60 mL/min (ref >60)
Glucose, Bld: 111 mg/dL — ABNORMAL HIGH (ref 70–99)
Potassium: 3.9 mmol/L (ref 3.5–5.1)
Sodium: 136 mmol/L (ref 135–145)
Total Bilirubin: 0.6 mg/dL (ref 0.3–1.2)
Total Protein: 7.6 g/dL (ref 6.5–8.1)

## 2023-06-19 LAB — CBC
HCT: 31.1 % — ABNORMAL LOW (ref 36.0–46.0)
Hemoglobin: 10.2 g/dL — ABNORMAL LOW (ref 12.0–15.0)
MCH: 29.1 pg (ref 26.0–34.0)
MCHC: 32.8 g/dL (ref 30.0–36.0)
MCV: 88.6 fL (ref 80.0–100.0)
Platelets: 886 10*3/uL — ABNORMAL HIGH (ref 150–400)
RBC: 3.51 MIL/uL — ABNORMAL LOW (ref 3.87–5.11)
RDW: 14.2 % (ref 11.5–15.5)
WBC: 13.6 10*3/uL — ABNORMAL HIGH (ref 4.0–10.5)
nRBC: 0 % (ref 0.0–0.2)

## 2023-06-19 MED ORDER — ENSURE ENLIVE PO LIQD
237.0000 mL | Freq: Two times a day (BID) | ORAL | Status: DC
  Administered 2023-06-19 – 2023-06-20 (×2): 237 mL via ORAL

## 2023-06-19 MED ORDER — METHOCARBAMOL 500 MG PO TABS
500.0000 mg | ORAL_TABLET | Freq: Three times a day (TID) | ORAL | Status: DC | PRN
  Administered 2023-06-19: 500 mg via ORAL
  Filled 2023-06-19: qty 1

## 2023-06-19 MED ORDER — METHOCARBAMOL 500 MG PO TABS
500.0000 mg | ORAL_TABLET | Freq: Three times a day (TID) | ORAL | Status: DC
  Administered 2023-06-19 – 2023-06-20 (×3): 500 mg via ORAL
  Filled 2023-06-19 (×3): qty 1

## 2023-06-19 MED ORDER — OXYCODONE HCL 5 MG PO TABS
15.0000 mg | ORAL_TABLET | ORAL | Status: DC | PRN
  Administered 2023-06-19 – 2023-06-20 (×3): 15 mg via ORAL
  Administered 2023-06-20 (×2): 20 mg via ORAL
  Filled 2023-06-19: qty 4
  Filled 2023-06-19 (×2): qty 3
  Filled 2023-06-19 (×3): qty 4

## 2023-06-19 NOTE — TOC Progression Note (Signed)
Transition of Care [facility]) - Progression Note    Patient Details  Name: ODA PLACKE MRN: [id number] Date of Birth: 02/26/1976  Transition of Care [facility]) CM/SW Contact  Gordy Clement, RN Phone Number: 06/19/2023, 11:47 AM  Clinical Narrative:     Met with Patient bedside to discuss DC plan. Patient will go home with IVABX (PICC to be placed 10/2) [facility] will provide home meds and services. Teaching will take place bedside with patient and her Husband prior to DC.   TOC will continue to follow patient for any additional discharge needs            Expected Discharge Plan and Services                                               Social Determinants of Health (SDOH) Interventions SDOH Screenings   Food Insecurity: No Food Insecurity (06/15/2023)  Housing: Low Risk  (06/15/2023)  Transportation Needs: No Transportation Needs (06/15/2023)  Utilities: Not At Risk (06/15/2023)  Depression (PHQ2-9): Low Risk  (07/22/2021)  Tobacco Use: Low Risk  (06/15/2023)    Readmission Risk Interventions     No data to display

## 2023-06-19 NOTE — Progress Notes (Addendum)
PHARMACY CONSULT NOTE FOR:  OUTPATIENT  PARENTERAL ANTIBIOTIC THERAPY (OPAT)  Indication: MSSA discitis/epidural abscess Regimen: Cefazolin 2g IV ever 8 hours End date: 08/13/23 (8 weeks from neg BCx 9/30)  IV antibiotic discharge orders are pended. To discharging provider:  please sign these orders via discharge navigator,  Select New Orders & click on the button choice - Manage This Unsigned Work.     Thank you for allowing pharmacy to be a part of this patient's care.  Georgina Pillion, PharmD, BCPS, BCIDP Infectious Diseases Clinical Pharmacist 06/19/2023 2:00 PM   **Pharmacist phone directory can now be found on [email or web address] ([email or web address]).  Listed under [facility].

## 2023-06-19 NOTE — Progress Notes (Signed)
Initial Nutrition Assessment  DOCUMENTATION CODES:   Non-severe (moderate) malnutrition in context of acute illness/injury, Obesity unspecified  INTERVENTION:  Ensure Plus High Protein po BID, each supplement provides 350 kcal and 20 grams of protein.    NUTRITION DIAGNOSIS:   Moderate Malnutrition related to acute illness (discitis/osteomulitis) as evidenced by energy intake < or equal to 50% for > or equal to 5 days, moderate fat depletion, mild muscle depletion.    GOAL:   Patient will meet greater than or equal to 90% of their needs    MONITOR:   PO intake, Labs, Weight trends, Supplement acceptance  REASON FOR ASSESSMENT:   Malnutrition Screening Tool    ASSESSMENT:   47 y.o. F, PMH; HLD, GERD, Migraine. MRI showed discitis/osteomyelitis at L5-S1 with epidural inflammation as well as a small sacral epidural abscess, and once this resulted she was sent to the ER. 9/30; surgery with diagnosis of bacteremia.  Pt setting up in chair with husband at bed side. She was moving around a lot in chair with obvious signs of uncomfortable pain. Stated weight prior to admission was 215#.  Reported poor appetite, with meal recall of; yesterday 1/2 BLT, 2 cartons of chocolate milk, and pickle, Breakfast 2 pieces of bacon, chocolate milk and peaches. Likes chocolate milk.  Since medication that was gave to her after fall, nothing has taste to it. Pt reported 3 days with out BM. Had BM yesterday. Recommend to stay on bowel regimen. Discussed Ensure while appetite poor. In agreement.   Admit weight: 94.3 kg Current weight: 94.3  Weight history; 06/15/23 94.3 kg  04/12/23 103.4 kg  02/01/23 103.4 kg  12/22/22 99.8 kg  12/07/22 102.5 kg  09/28/22 102.1 kg  08/17/22 97.5 kg     Nutritionally Relevant Medications: Scheduled Meds:  enoxaparin (LOVENOX) injection  40 mg Subcutaneous Q24H   feeding supplement  237 mL Oral BID BM   gabapentin  300 mg Oral QHS   methocarbamol  500 mg  Oral TID   pantoprazole  40 mg Oral Daily   polyethylene glycol  17 g Oral BID   senna  2 tablet Oral QHS   traZODone  100 mg Oral QHS   Continuous Infusions:   ceFAZolin (ANCEF) IV 2 g (06/19/23 0513)   PRN Meds:.bisacodyl, HYDROmorphone (DILAUDID) injection, ibuprofen, naLOXone (NARCAN)  injection, ondansetron **OR** ondansetron (ZOFRAN) IV, oxyCODONE  Labs Reviewed: Chem 7 labs reviewed CBG ranges from 109-111 mg/dL over the last 24 hours    NUTRITION - FOCUSED PHYSICAL EXAM:  Flowsheet Row Most Recent Value  Orbital Region Moderate depletion  Upper Arm Region Moderate depletion  Thoracic and Lumbar Region No depletion  Buccal Region Mild depletion  Temple Region Moderate depletion  Clavicle Bone Region Moderate depletion  Clavicle and Acromion Bone Region Mild depletion  Scapular Bone Region Unable to assess  Dorsal Hand Mild depletion  Patellar Region No depletion  Anterior Thigh Region No depletion  Posterior Calf Region No depletion  Edema (RD Assessment) None  Hair Reviewed  Eyes Reviewed  Mouth Reviewed  Skin Reviewed  Nails Reviewed       Diet Order:   Diet Order             Diet regular Room service appropriate? Yes; Fluid consistency: Thin  Diet effective now                   EDUCATION NEEDS:   Education needs have been addressed  Skin:  Skin Assessment: Reviewed RN  Assessment  Last BM:  9/30  Height:   Ht Readings from Last 1 Encounters:  06/15/23 5\' 8"  (1.727 m)    Weight:   Wt Readings from Last 1 Encounters:  06/15/23 94.3 kg    Ideal Body Weight:     BMI:  Body mass index is 31.63 kg/m.  Estimated Nutritional Needs:   Kcal:  2000-2400kcal  Protein:  95-120g  Fluid:  49ml/kcal    Jamelle Haring RDN, LDN Clinical Dietitian  RDN pager # available on Amion

## 2023-06-19 NOTE — Progress Notes (Signed)
Hospital Lab, [address] [place], [place] [address]    Report Status PENDING  Incomplete    RADIOLOGY STUDIES/RESULTS: ECHO TEE  Result Date: 06/18/2023    TRANSESOPHOGEAL ECHO REPORT   Patient Name:   ADDALEE KAVANAGH [facility] Date of Exam: 06/18/2023 Medical Rec #:  [id number]            Height:       68.0 in Accession #:    [id number]           Weight:       208.0 lb Date of Birth:  [birth year]             BSA:          2.078 m Patient Age:    47 years             BP:           123/79 mmHg Patient Gender: F                    HR:           93 bpm. Exam Location:  Inpatient Procedure: Transesophageal Echo, Color Doppler, Cardiac Doppler and 3D Echo Indications:     Bacteremia  History:         Patient has prior history of Echocardiogram examinations, most                  recent 06/16/2023. Risk Factors:Dyslipidemia.  Sonographer:     Delcie Roch RDCS Referring Phys:  [id number] Cyndi Bender Diagnosing Phys: Charlton Haws MD PROCEDURE: After discussion of the risks and benefits of a TEE, an informed consent was obtained from the patient. The transesophogeal probe was passed without difficulty through the esophogus of the patient. Imaged were obtained with the patient in a left lateral decubitus position. Sedation performed by different physician. The patient was monitored while under deep sedation. Anesthestetic sedation was provided intravenously by Anesthesiology: 300mg  of Propofol, 100mg  of Lidocaine. The patient's vital signs; including heart rate, blood pressure, and oxygen saturation; remained stable throughout the procedure. The patient developed no complications during the procedure.  IMPRESSIONS  1. Bicuspid AV No evidence of SBE/vegetations.  2. Left ventricular ejection fraction, by estimation, is 60 to 65%. The left ventricle has normal function. The left ventricle has no regional wall motion abnormalities.  3. Right ventricular systolic function is normal. The right ventricular size is normal.  4. No left atrial/left atrial appendage thrombus was detected.  5. The mitral valve is normal in structure. Trivial mitral valve regurgitation. No evidence of mitral stenosis.  6. Sievers 1 fused right /left cusps. . The aortic valve is bicuspid. Aortic valve regurgitation is trivial. No aortic stenosis is present.  7. Aortic dilatation noted. There is mild dilatation of the ascending aorta, measuring 38 mm.   8. The inferior vena cava is normal in size with greater than 50% respiratory variability, suggesting right atrial pressure of 3 mmHg. Conclusion(s)/Recommendation(s): Normal biventricular function without evidence of hemodynamically significant valvular heart disease. FINDINGS  Left Ventricle: Left ventricular ejection fraction, by estimation, is 60 to 65%. The left ventricle has normal function. The left ventricle has no regional wall motion abnormalities. The left ventricular internal cavity size was normal in size. There is  no left ventricular hypertrophy. Right Ventricle: The right ventricular size is normal. No increase in right ventricular wall thickness. Right ventricular systolic function is normal. Left Atrium: Left atrial size was normal in size. No left  PROGRESS NOTE        PATIENT DETAILS Name: Susan [name] Age: 47 y.o. Sex: female Date of Birth: May 04, 1976 Admit Date: 06/15/2023 Admitting Physician Costin Otelia Sergeant, MD ZOX:WRUEAV, Lelon Mast, [name]  Brief Summary: Patient is a 47 y.o.  female with history of HLD-who presented with intractable back pain following a recent trip to [place] (ongoing since 9/13)-outpatient MRI showed L5-S1 discitis/osteomyelitis-she was then admitted to [facility].  Post admission-blood cultures positive for MSSA.  Significant events: 9/27>> admit to [facility]  Significant studies: 9/27>> MRI lumbar spine: Discitis/osteomyelitis at L5-S1. 9/27>> RUQ ultrasound: S/p cholecystectomy-no acute lesions. 9/28>> MRI thoracic spine: Negative for osteomyelitis. 9/28>> echo: EF 60-65%, bicuspid aortic valve 9/30>> TEE: No endocarditis  Significant microbiology data: 9/27>> blood culture: MSSA 9/29>> blood culture: Negative 9/30>> blood culture: Negative  Procedures: 9/30>> TEE: No endocarditis  Consults: ID IR  Subjective: Somewhat comfortable today-but continues to have persistent back pain.  Finally had BM following suppository yesterday.  Objective: Vitals: Blood pressure 109/79, pulse 83, temperature 98.4 F (36.9 C), temperature source Oral, resp. rate 20, height 5\' 8"  (1.727 m), weight 94.3 kg, last menstrual period 07/03/2022, SpO2 98%.   Exam: Gen Exam:Alert awake-not in any distress HEENT:atraumatic, normocephalic Chest: B/L clear to auscultation anteriorly CVS:S1S2 regular Abdomen:soft non tender, non distended Extremities:no edema Neurology: Non focal Skin: no rash  Pertinent Labs/Radiology:    Latest Ref Rng & Units 06/19/2023    4:02 AM 06/18/2023    5:27 AM 06/17/2023    7:41 AM  CBC  WBC 4.0 - 10.5 K/uL 13.6  13.8  15.4   Hemoglobin 12.0 - 15.0 g/dL 40.9  81.1  91.4   Hematocrit 36.0 - 46.0 % 31.1  31.8  36.4   Platelets 150 - 400 K/uL 886  820  802      Lab Results  Component Value Date   NA 136 06/19/2023   K 3.9 06/19/2023   CL 97 (L) 06/19/2023   CO2 27 06/19/2023      Assessment/Plan: MSSA bacteremia with L5-S1 osteomyelitis/discitis Afebrile-leukocytosis trending down Repeat blood cultures on 9/29 and 9/13 negative so far TEE negative for endocarditis Follow cultures-if they remain negative-Place PICC line on 10/2 ID planning 6-8 weeks of IV Ancef  Continues to have back pain-still requiring some IV narcotics intermittently Have adjusted oral narcotic regimen-added Robaxin Encourage mobilization Await further recommendations from infectious disease.   Transaminitis Probably due to MSSA bacteremia Acute hepatitis serology negative RUQ ultrasound nonacute LFTs downtrending Continue to hold statin  Thrombocytosis Secondary to inflammation from bacteremia Supportive care-follow CBC periodically  HLD Holding statin due to above  GERD:  PPI  Peripheral neuropathy Neurontin  Constipation Likely due to narcotics Had BM on 9/30 following Dulcolax suppository Continue MiraLAX/senna  Obesity: Estimated body mass index is 31.63 kg/m as calculated from the following:   Height as of this encounter: 5\' 8"  (1.727 m).   Weight as of this encounter: 94.3 kg.   Code status:   Code Status: Full Code   DVT Prophylaxis: enoxaparin (LOVENOX) injection 40 mg Start: 06/16/23 1030 SCDs Start: 06/15/23 1540   Family Communication: None at bedside   Disposition Plan: Status is: Inpatient Remains inpatient appropriate because: Severity of illness   Planned Discharge Destination:Home   Diet: Diet Order             Diet regular  Hospital Lab, [address] [place], [place] [address]    Report Status PENDING  Incomplete    RADIOLOGY STUDIES/RESULTS: ECHO TEE  Result Date: 06/18/2023    TRANSESOPHOGEAL ECHO REPORT   Patient Name:   ADDALEE KAVANAGH [facility] Date of Exam: 06/18/2023 Medical Rec #:  [id number]            Height:       68.0 in Accession #:    [id number]           Weight:       208.0 lb Date of Birth:  [birth year]             BSA:          2.078 m Patient Age:    47 years             BP:           123/79 mmHg Patient Gender: F                    HR:           93 bpm. Exam Location:  Inpatient Procedure: Transesophageal Echo, Color Doppler, Cardiac Doppler and 3D Echo Indications:     Bacteremia  History:         Patient has prior history of Echocardiogram examinations, most                  recent 06/16/2023. Risk Factors:Dyslipidemia.  Sonographer:     Delcie Roch RDCS Referring Phys:  [id number] Cyndi Bender Diagnosing Phys: Charlton Haws MD PROCEDURE: After discussion of the risks and benefits of a TEE, an informed consent was obtained from the patient. The transesophogeal probe was passed without difficulty through the esophogus of the patient. Imaged were obtained with the patient in a left lateral decubitus position. Sedation performed by different physician. The patient was monitored while under deep sedation. Anesthestetic sedation was provided intravenously by Anesthesiology: 300mg  of Propofol, 100mg  of Lidocaine. The patient's vital signs; including heart rate, blood pressure, and oxygen saturation; remained stable throughout the procedure. The patient developed no complications during the procedure.  IMPRESSIONS  1. Bicuspid AV No evidence of SBE/vegetations.  2. Left ventricular ejection fraction, by estimation, is 60 to 65%. The left ventricle has normal function. The left ventricle has no regional wall motion abnormalities.  3. Right ventricular systolic function is normal. The right ventricular size is normal.  4. No left atrial/left atrial appendage thrombus was detected.  5. The mitral valve is normal in structure. Trivial mitral valve regurgitation. No evidence of mitral stenosis.  6. Sievers 1 fused right /left cusps. . The aortic valve is bicuspid. Aortic valve regurgitation is trivial. No aortic stenosis is present.  7. Aortic dilatation noted. There is mild dilatation of the ascending aorta, measuring 38 mm.   8. The inferior vena cava is normal in size with greater than 50% respiratory variability, suggesting right atrial pressure of 3 mmHg. Conclusion(s)/Recommendation(s): Normal biventricular function without evidence of hemodynamically significant valvular heart disease. FINDINGS  Left Ventricle: Left ventricular ejection fraction, by estimation, is 60 to 65%. The left ventricle has normal function. The left ventricle has no regional wall motion abnormalities. The left ventricular internal cavity size was normal in size. There is  no left ventricular hypertrophy. Right Ventricle: The right ventricular size is normal. No increase in right ventricular wall thickness. Right ventricular systolic function is normal. Left Atrium: Left atrial size was normal in size. No left  Hospital Lab, [address] [place], [place] [address]    Report Status PENDING  Incomplete    RADIOLOGY STUDIES/RESULTS: ECHO TEE  Result Date: 06/18/2023    TRANSESOPHOGEAL ECHO REPORT   Patient Name:   ADDALEE KAVANAGH [facility] Date of Exam: 06/18/2023 Medical Rec #:  [id number]            Height:       68.0 in Accession #:    [id number]           Weight:       208.0 lb Date of Birth:  [birth year]             BSA:          2.078 m Patient Age:    47 years             BP:           123/79 mmHg Patient Gender: F                    HR:           93 bpm. Exam Location:  Inpatient Procedure: Transesophageal Echo, Color Doppler, Cardiac Doppler and 3D Echo Indications:     Bacteremia  History:         Patient has prior history of Echocardiogram examinations, most                  recent 06/16/2023. Risk Factors:Dyslipidemia.  Sonographer:     Delcie Roch RDCS Referring Phys:  [id number] Cyndi Bender Diagnosing Phys: Charlton Haws MD PROCEDURE: After discussion of the risks and benefits of a TEE, an informed consent was obtained from the patient. The transesophogeal probe was passed without difficulty through the esophogus of the patient. Imaged were obtained with the patient in a left lateral decubitus position. Sedation performed by different physician. The patient was monitored while under deep sedation. Anesthestetic sedation was provided intravenously by Anesthesiology: 300mg  of Propofol, 100mg  of Lidocaine. The patient's vital signs; including heart rate, blood pressure, and oxygen saturation; remained stable throughout the procedure. The patient developed no complications during the procedure.  IMPRESSIONS  1. Bicuspid AV No evidence of SBE/vegetations.  2. Left ventricular ejection fraction, by estimation, is 60 to 65%. The left ventricle has normal function. The left ventricle has no regional wall motion abnormalities.  3. Right ventricular systolic function is normal. The right ventricular size is normal.  4. No left atrial/left atrial appendage thrombus was detected.  5. The mitral valve is normal in structure. Trivial mitral valve regurgitation. No evidence of mitral stenosis.  6. Sievers 1 fused right /left cusps. . The aortic valve is bicuspid. Aortic valve regurgitation is trivial. No aortic stenosis is present.  7. Aortic dilatation noted. There is mild dilatation of the ascending aorta, measuring 38 mm.   8. The inferior vena cava is normal in size with greater than 50% respiratory variability, suggesting right atrial pressure of 3 mmHg. Conclusion(s)/Recommendation(s): Normal biventricular function without evidence of hemodynamically significant valvular heart disease. FINDINGS  Left Ventricle: Left ventricular ejection fraction, by estimation, is 60 to 65%. The left ventricle has normal function. The left ventricle has no regional wall motion abnormalities. The left ventricular internal cavity size was normal in size. There is  no left ventricular hypertrophy. Right Ventricle: The right ventricular size is normal. No increase in right ventricular wall thickness. Right ventricular systolic function is normal. Left Atrium: Left atrial size was normal in size. No left  Hospital Lab, [address] [place], [place] [address]    Report Status PENDING  Incomplete    RADIOLOGY STUDIES/RESULTS: ECHO TEE  Result Date: 06/18/2023    TRANSESOPHOGEAL ECHO REPORT   Patient Name:   ADDALEE KAVANAGH [facility] Date of Exam: 06/18/2023 Medical Rec #:  [id number]            Height:       68.0 in Accession #:    [id number]           Weight:       208.0 lb Date of Birth:  [birth year]             BSA:          2.078 m Patient Age:    47 years             BP:           123/79 mmHg Patient Gender: F                    HR:           93 bpm. Exam Location:  Inpatient Procedure: Transesophageal Echo, Color Doppler, Cardiac Doppler and 3D Echo Indications:     Bacteremia  History:         Patient has prior history of Echocardiogram examinations, most                  recent 06/16/2023. Risk Factors:Dyslipidemia.  Sonographer:     Delcie Roch RDCS Referring Phys:  [id number] Cyndi Bender Diagnosing Phys: Charlton Haws MD PROCEDURE: After discussion of the risks and benefits of a TEE, an informed consent was obtained from the patient. The transesophogeal probe was passed without difficulty through the esophogus of the patient. Imaged were obtained with the patient in a left lateral decubitus position. Sedation performed by different physician. The patient was monitored while under deep sedation. Anesthestetic sedation was provided intravenously by Anesthesiology: 300mg  of Propofol, 100mg  of Lidocaine. The patient's vital signs; including heart rate, blood pressure, and oxygen saturation; remained stable throughout the procedure. The patient developed no complications during the procedure.  IMPRESSIONS  1. Bicuspid AV No evidence of SBE/vegetations.  2. Left ventricular ejection fraction, by estimation, is 60 to 65%. The left ventricle has normal function. The left ventricle has no regional wall motion abnormalities.  3. Right ventricular systolic function is normal. The right ventricular size is normal.  4. No left atrial/left atrial appendage thrombus was detected.  5. The mitral valve is normal in structure. Trivial mitral valve regurgitation. No evidence of mitral stenosis.  6. Sievers 1 fused right /left cusps. . The aortic valve is bicuspid. Aortic valve regurgitation is trivial. No aortic stenosis is present.  7. Aortic dilatation noted. There is mild dilatation of the ascending aorta, measuring 38 mm.   8. The inferior vena cava is normal in size with greater than 50% respiratory variability, suggesting right atrial pressure of 3 mmHg. Conclusion(s)/Recommendation(s): Normal biventricular function without evidence of hemodynamically significant valvular heart disease. FINDINGS  Left Ventricle: Left ventricular ejection fraction, by estimation, is 60 to 65%. The left ventricle has normal function. The left ventricle has no regional wall motion abnormalities. The left ventricular internal cavity size was normal in size. There is  no left ventricular hypertrophy. Right Ventricle: The right ventricular size is normal. No increase in right ventricular wall thickness. Right ventricular systolic function is normal. Left Atrium: Left atrial size was normal in size. No left  PROGRESS NOTE        PATIENT DETAILS Name: Susan [name] Age: 47 y.o. Sex: female Date of Birth: May 04, 1976 Admit Date: 06/15/2023 Admitting Physician Costin Otelia Sergeant, MD ZOX:WRUEAV, Lelon Mast, [name]  Brief Summary: Patient is a 47 y.o.  female with history of HLD-who presented with intractable back pain following a recent trip to [place] (ongoing since 9/13)-outpatient MRI showed L5-S1 discitis/osteomyelitis-she was then admitted to [facility].  Post admission-blood cultures positive for MSSA.  Significant events: 9/27>> admit to [facility]  Significant studies: 9/27>> MRI lumbar spine: Discitis/osteomyelitis at L5-S1. 9/27>> RUQ ultrasound: S/p cholecystectomy-no acute lesions. 9/28>> MRI thoracic spine: Negative for osteomyelitis. 9/28>> echo: EF 60-65%, bicuspid aortic valve 9/30>> TEE: No endocarditis  Significant microbiology data: 9/27>> blood culture: MSSA 9/29>> blood culture: Negative 9/30>> blood culture: Negative  Procedures: 9/30>> TEE: No endocarditis  Consults: ID IR  Subjective: Somewhat comfortable today-but continues to have persistent back pain.  Finally had BM following suppository yesterday.  Objective: Vitals: Blood pressure 109/79, pulse 83, temperature 98.4 F (36.9 C), temperature source Oral, resp. rate 20, height 5\' 8"  (1.727 m), weight 94.3 kg, last menstrual period 07/03/2022, SpO2 98%.   Exam: Gen Exam:Alert awake-not in any distress HEENT:atraumatic, normocephalic Chest: B/L clear to auscultation anteriorly CVS:S1S2 regular Abdomen:soft non tender, non distended Extremities:no edema Neurology: Non focal Skin: no rash  Pertinent Labs/Radiology:    Latest Ref Rng & Units 06/19/2023    4:02 AM 06/18/2023    5:27 AM 06/17/2023    7:41 AM  CBC  WBC 4.0 - 10.5 K/uL 13.6  13.8  15.4   Hemoglobin 12.0 - 15.0 g/dL 40.9  81.1  91.4   Hematocrit 36.0 - 46.0 % 31.1  31.8  36.4   Platelets 150 - 400 K/uL 886  820  802      Lab Results  Component Value Date   NA 136 06/19/2023   K 3.9 06/19/2023   CL 97 (L) 06/19/2023   CO2 27 06/19/2023      Assessment/Plan: MSSA bacteremia with L5-S1 osteomyelitis/discitis Afebrile-leukocytosis trending down Repeat blood cultures on 9/29 and 9/13 negative so far TEE negative for endocarditis Follow cultures-if they remain negative-Place PICC line on 10/2 ID planning 6-8 weeks of IV Ancef  Continues to have back pain-still requiring some IV narcotics intermittently Have adjusted oral narcotic regimen-added Robaxin Encourage mobilization Await further recommendations from infectious disease.   Transaminitis Probably due to MSSA bacteremia Acute hepatitis serology negative RUQ ultrasound nonacute LFTs downtrending Continue to hold statin  Thrombocytosis Secondary to inflammation from bacteremia Supportive care-follow CBC periodically  HLD Holding statin due to above  GERD:  PPI  Peripheral neuropathy Neurontin  Constipation Likely due to narcotics Had BM on 9/30 following Dulcolax suppository Continue MiraLAX/senna  Obesity: Estimated body mass index is 31.63 kg/m as calculated from the following:   Height as of this encounter: 5\' 8"  (1.727 m).   Weight as of this encounter: 94.3 kg.   Code status:   Code Status: Full Code   DVT Prophylaxis: enoxaparin (LOVENOX) injection 40 mg Start: 06/16/23 1030 SCDs Start: 06/15/23 1540   Family Communication: None at bedside   Disposition Plan: Status is: Inpatient Remains inpatient appropriate because: Severity of illness   Planned Discharge Destination:Home   Diet: Diet Order             Diet regular

## 2023-06-19 NOTE — Progress Notes (Signed)
[facility]    Date of Admission:  06/15/2023   Total days of antibiotics 4, Cefazolin          ID: Susan [name] is a 47 y.o. female with L5-S1 discitis/osteomyelitis with epidural abscess and MSSA bacteremia  Principal Problem:   Discitis Active Problems:   Bacteremia with signs of infection   Malnutrition of moderate degree   Subjective: She is feeling well. She still has some pain but it is managable. She has had a bowel movement after going without over the weekend.  Medications:   enoxaparin (LOVENOX) injection  40 mg Subcutaneous Q24H   feeding supplement  237 mL Oral BID BM   gabapentin  300 mg Oral QHS   methocarbamol  500 mg Oral TID   pantoprazole  40 mg Oral Daily   polyethylene glycol  17 g Oral BID   senna  2 tablet Oral QHS   traZODone  100 mg Oral QHS    Objective: Vital signs in last 24 hours: Temp:  [98 F (36.7 C)-98.9 F (37.2 C)] 98.9 F (37.2 C) (10/01 1615) Pulse Rate:  [83-101] 94 (10/01 1615) Resp:  [19-22] 19 (10/01 1615) BP: (101-126)/(62-84) 110/84 (10/01 1615) SpO2:  [94 %-99 %] 95 % (10/01 1615)   General appearance: alert, cooperative, and no distress Head: Normocephalic, without obvious abnormality, atraumatic Back: symmetric, no curvature. ROM normal. No CVA tenderness. Cardio: regular rate and rhythm, S1, S2 normal, no murmur, click, rub or gallop GI: soft, non-tender; bowel sounds normal; no masses,  no organomegaly Extremities: extremities normal, atraumatic, no cyanosis or edema Pulses: 2+ and symmetric Skin: Skin color, texture, turgor normal. No rashes or lesions  Lab Results Recent Labs    06/18/23 0527 06/19/23 0402  WBC 13.8* 13.6*  HGB 10.2* 10.2*  HCT 31.8* 31.1*  NA 134* 136  K 4.1 3.9  CL 96* 97*  CO2 26 27  BUN 13 11  CREATININE 1.24* 1.04*   Liver Panel Recent Labs    06/18/23 0527 06/19/23 0402  PROT 7.8 7.6  ALBUMIN 2.8* 2.7*  AST 96* 63*  ALT 140* 96*   ALKPHOS 432* 384*  BILITOT 0.9 0.6   Sedimentation Rate No results for input(s): "ESRSEDRATE" in the last 72 hours. C-Reactive Protein No results for input(s): "CRP" in the last 72 hours.  Microbiology:  Studies/Results: [place] EKG SITE RITE  Result Date: 06/19/2023 If Site Rite image not attached, placement could not be confirmed due to current cardiac rhythm.  ECHO TEE  Result Date: 06/18/2023    TRANSESOPHOGEAL ECHO REPORT   Patient Name:   Susan [name] [facility] Date of Exam: 06/18/2023 Medical Rec #:  [id number]            Height:       68.0 in Accession #:    [id number]           Weight:       208.0 lb Date of Birth:  [birth year]            BSA:          2.078 m Patient Age:    47 years             BP:           123/79 mmHg Patient Gender: F                    HR:  93 bpm. Exam Location:  Inpatient Procedure: Transesophageal Echo, Color Doppler, Cardiac Doppler and 3D Echo Indications:     Bacteremia  History:         Patient has prior history of Echocardiogram examinations, most                  recent 06/16/2023. Risk Factors:Dyslipidemia.  Sonographer:     Delcie Roch RDCS Referring Phys:  [id number] Cyndi Bender Diagnosing Phys: Charlton Haws MD PROCEDURE: After discussion of the risks and benefits of a TEE, an informed consent was obtained from the patient. The transesophogeal probe was passed without difficulty through the esophogus of the patient. Imaged were obtained with the patient in a left lateral decubitus position. Sedation performed by different physician. The patient was monitored while under deep sedation. Anesthestetic sedation was provided intravenously by Anesthesiology: 300mg  of Propofol, 100mg  of Lidocaine. The patient's vital signs; including heart rate, blood pressure, and oxygen saturation; remained stable throughout the procedure. The patient developed no complications during the procedure.  IMPRESSIONS  1. Bicuspid AV No evidence of SBE/vegetations.  2. Left  ventricular ejection fraction, by estimation, is 60 to 65%. The left ventricle has normal function. The left ventricle has no regional wall motion abnormalities.  3. Right ventricular systolic function is normal. The right ventricular size is normal.  4. No left atrial/left atrial appendage thrombus was detected.  5. The mitral valve is normal in structure. Trivial mitral valve regurgitation. No evidence of mitral stenosis.  6. Sievers 1 fused right /left cusps. . The aortic valve is bicuspid. Aortic valve regurgitation is trivial. No aortic stenosis is present.  7. Aortic dilatation noted. There is mild dilatation of the ascending aorta, measuring 38 mm.  8. The inferior vena cava is normal in size with greater than 50% respiratory variability, suggesting right atrial pressure of 3 mmHg. Conclusion(s)/Recommendation(s): Normal biventricular function without evidence of hemodynamically significant valvular heart disease. FINDINGS  Left Ventricle: Left ventricular ejection fraction, by estimation, is 60 to 65%. The left ventricle has normal function. The left ventricle has no regional wall motion abnormalities. The left ventricular internal cavity size was normal in size. There is  no left ventricular hypertrophy. Right Ventricle: The right ventricular size is normal. No increase in right ventricular wall thickness. Right ventricular systolic function is normal. Left Atrium: Left atrial size was normal in size. No left atrial/left atrial appendage thrombus was detected. Right Atrium: Right atrial size was normal in size. Pericardium: There is no evidence of pericardial effusion. Mitral Valve: The mitral valve is normal in structure. Trivial mitral valve regurgitation. No evidence of mitral valve stenosis. There is no evidence of mitral valve vegetation. Tricuspid Valve: The tricuspid valve is normal in structure. Tricuspid valve regurgitation is not demonstrated. No evidence of tricuspid stenosis. There is no  evidence of tricuspid valve vegetation. Aortic Valve: Sievers 1 fused right /left cusps. The aortic valve is bicuspid. Aortic valve regurgitation is trivial. No aortic stenosis is present. Aortic valve mean gradient measures 8.0 mmHg. Aortic valve peak gradient measures 14.9 mmHg. There is no evidence of aortic valve vegetation. Pulmonic Valve: The pulmonic valve was normal in structure. Pulmonic valve regurgitation is trivial. No evidence of pulmonic stenosis. There is no evidence of pulmonic valve vegetation. Aorta: Aortic dilatation noted. There is mild dilatation of the ascending aorta, measuring 38 mm. Venous: The inferior vena cava is normal in size with greater than 50% respiratory variability, suggesting right atrial pressure of 3 mmHg. IAS/Shunts: No atrial  level shunt detected by color flow Doppler. Additional Comments: Bicuspid AV No evidence of SBE/vegetations. AORTIC VALVE AV Vmax:      193.00 cm/s AV Vmean:     132.000 cm/s AV VTI:       0.338 m AV Peak Grad: 14.9 mmHg AV Mean Grad: 8.0 mmHg Charlton Haws MD Electronically signed by Charlton Haws MD Signature Date/Time: 06/18/2023/1:55:28 PM    Final    EP STUDY  Result Date: 06/18/2023 See surgical note for result.   Assessment/Plan: This patient has L5-S1 discitis/osteomyelitis with epidural abscess and MSSA bacteremia. She has a history of HLD. She originally presented with back pain ongoing since a fall in early September while visiting [place].   L5-S1 discitis/osteomyelitis with epidural abscess and MSSA bacteremia - Leukocytosis is downtrending, afebrile - Blood cultures on 9/27 with MSSA, redrawn with no growth at 24 hours - TEE without evidence of endocarditis PLAN - Continue cefazolin now on day 4 - Will need Picc line (to be placed tomorrow) and discharge on 8 week course of cefazolin  ID will sign off - please call back with any questions/concerns or if we can be of further assistance.    Katheran James, [facility] Internal Medicine Resident PGY-1 Pager: [phone]  06/19/2023, 5:09 PM

## 2023-06-19 NOTE — Progress Notes (Signed)
Consent obtained after discussing risks, benefits, and alternatives for PICC placement. Patient requested waiting until 10-2 am for placement. Ross-Lynch RN notified

## 2023-06-20 ENCOUNTER — Other Ambulatory Visit (HOSPITAL_COMMUNITY): Payer: Self-pay

## 2023-06-20 DIAGNOSIS — A4901 Methicillin susceptible Staphylococcus aureus infection, unspecified site: Secondary | ICD-10-CM | POA: Diagnosis not present

## 2023-06-20 DIAGNOSIS — M4647 Discitis, unspecified, lumbosacral region: Secondary | ICD-10-CM | POA: Diagnosis not present

## 2023-06-20 DIAGNOSIS — E440 Moderate protein-calorie malnutrition: Secondary | ICD-10-CM

## 2023-06-20 DIAGNOSIS — M4640 Discitis, unspecified, site unspecified: Secondary | ICD-10-CM | POA: Diagnosis not present

## 2023-06-20 LAB — BASIC METABOLIC PANEL
Anion gap: 10 (ref 5–15)
BUN: 13 mg/dL (ref 6–20)
CO2: 29 mmol/L (ref 22–32)
Calcium: 9.5 mg/dL (ref 8.9–10.3)
Chloride: 96 mmol/L — ABNORMAL LOW (ref 98–111)
Creatinine, Ser: 0.96 mg/dL (ref 0.44–1.00)
GFR, Estimated: >60 mL/min (ref >60)
Glucose, Bld: 118 mg/dL — ABNORMAL HIGH (ref 70–99)
Potassium: 3.7 mmol/L (ref 3.5–5.1)
Sodium: 135 mmol/L (ref 135–145)

## 2023-06-20 LAB — CBC
HCT: 31.4 % — ABNORMAL LOW (ref 36.0–46.0)
Hemoglobin: 9.9 g/dL — ABNORMAL LOW (ref 12.0–15.0)
MCH: 27.9 pg (ref 26.0–34.0)
MCHC: 31.5 g/dL (ref 30.0–36.0)
MCV: 88.5 fL (ref 80.0–100.0)
Platelets: 877 10*3/uL — ABNORMAL HIGH (ref 150–400)
RBC: 3.55 MIL/uL — ABNORMAL LOW (ref 3.87–5.11)
RDW: 14 % (ref 11.5–15.5)
WBC: 11.4 10*3/uL — ABNORMAL HIGH (ref 4.0–10.5)
nRBC: 0 % (ref 0.0–0.2)

## 2023-06-20 MED ORDER — CHLORHEXIDINE GLUCONATE CLOTH 2 % EX PADS
6.0000 | MEDICATED_PAD | Freq: Every day | CUTANEOUS | Status: DC
  Administered 2023-06-20: 6 via TOPICAL

## 2023-06-20 MED ORDER — SODIUM CHLORIDE 0.9% FLUSH: 10.0000 mL | INTRAVENOUS | Status: DC | PRN

## 2023-06-20 MED ORDER — SENNA 8.6 MG PO TABS
2.0000 | ORAL_TABLET | Freq: Every day | ORAL | 0 refills | Status: DC
  Filled 2023-06-20: qty 120, 60d supply, fill #0

## 2023-06-20 MED ORDER — SODIUM CHLORIDE 0.9% FLUSH: 10.0000 mL | Freq: Two times a day (BID) | INTRAVENOUS | Status: DC

## 2023-06-20 MED ORDER — METHOCARBAMOL 500 MG PO TABS
500.0000 mg | ORAL_TABLET | Freq: Three times a day (TID) | ORAL | 0 refills | Status: DC | PRN
  Filled 2023-06-20: qty 30, 10d supply, fill #0

## 2023-06-20 MED ORDER — OXYCODONE HCL 10 MG PO TABS
10.0000 mg | ORAL_TABLET | ORAL | 0 refills | Status: DC | PRN
  Filled 2023-06-20: qty 30, 4d supply, fill #0

## 2023-06-20 MED ORDER — OXYCODONE HCL 10 MG PO TABS
10.0000 mg | ORAL_TABLET | Freq: Four times a day (QID) | ORAL | 0 refills | Status: DC | PRN
  Filled 2023-06-20: qty 30, 5d supply, fill #0

## 2023-06-20 MED ORDER — BISACODYL 10 MG RE SUPP
10.0000 mg | Freq: Every day | RECTAL | 0 refills | Status: DC | PRN
Start: 2023-06-20 — End: 2023-06-27
  Filled 2023-06-20: qty 12, 12d supply, fill #0

## 2023-06-20 MED ORDER — POLYETHYLENE GLYCOL 3350 17 GM/SCOOP PO POWD
17.0000 g | Freq: Two times a day (BID) | ORAL | 0 refills | Status: DC
  Filled 2023-06-20: qty 238, 7d supply, fill #0

## 2023-06-20 MED ORDER — CEFAZOLIN IV (FOR PTA / DISCHARGE USE ONLY): 2.0000 g | Freq: Three times a day (TID) | INTRAVENOUS | 0 refills | Status: DC

## 2023-06-20 NOTE — Progress Notes (Signed)
Peripherally Inserted Central Catheter Placement  The IV Nurse has discussed with the patient and/or persons authorized to consent for the patient, the purpose of this procedure and the potential benefits and risks involved with this procedure.  The benefits include less needle sticks, lab draws from the catheter, and the patient may be discharged home with the catheter. Risks include, but not limited to, infection, bleeding, blood clot (thrombus formation), and puncture of an artery; nerve damage and irregular heartbeat and possibility to perform a PICC exchange if needed/ordered by physician.  Alternatives to this procedure were also discussed.  Bard Power PICC patient education guide, fact sheet on infection prevention and patient information card has been provided to patient /or left at bedside.    PICC Placement Documentation  PICC Single Lumen 06/20/23 Right Basilic 36 cm 0 cm (Active)  Indication for Insertion or Continuance of Line Home intravenous therapies (PICC only) 06/20/23 1016  Exposed Catheter (cm) 0 cm 06/20/23 1016  Site Assessment Clean, Dry, Intact 06/20/23 1016  Line Status Flushed;Blood return noted;Saline locked 06/20/23 1016  Dressing Type Transparent;Securing device 06/20/23 1016  Dressing Status Antimicrobial disc in place 06/20/23 1016  Line Care Connections checked and tightened 06/20/23 1016  Line Adjustment (NICU/IV Team Only) No 06/20/23 1016  Dressing Change Due 06/27/23 06/20/23 1016       Romie Jumper 06/20/2023, 10:23 AM

## 2023-06-20 NOTE — TOC Transition Note (Signed)
Transition of Care [facility]) - CM/SW Discharge Note   Patient Details  Name: JANIYLAH HANNIS MRN: [id number] Date of Birth: 1975-12-05  [facility] of [facility] CM/SW Contact:  Gordy Clement, RN Phone Number: 06/20/2023, 9:32 AM   Clinical Narrative:    Patient will DC to home with Husband. PICC line has been placed and OPAT orders to be in shortly. [employer] will provide IVABX and bedside teaching.  They will also have a RN to home to assist per their  protocol. Husband will transport home.   No additional TOC needs            Patient Goals and CMS Choice      Discharge Placement                         Discharge Plan and Services Additional resources added to the After Visit Summary for                                       Social Determinants of Health (SDOH) Interventions SDOH Screenings   Food Insecurity: No Food Insecurity (06/15/2023)  Housing: Low Risk  (06/15/2023)  Transportation Needs: No Transportation Needs (06/15/2023)  Utilities: Not At Risk (06/15/2023)  Depression (PHQ2-9): Low Risk  (07/22/2021)  Tobacco Use: Low Risk  (06/15/2023)     Readmission Risk Interventions     No data to display

## 2023-06-20 NOTE — Discharge Summary (Signed)
PATIENT DETAILS Name: Susan [name] Age: 47 y.o. Sex: female Date of Birth: January 05, 1976 MRN: [id number]. Admitting Physician: Leatha Gilding, MD NWG:[id number], [place], [name]  Admit Date: 06/15/2023 Discharge date: 06/20/2023  Recommendations for Outpatient Follow-up:  Follow up with PCP in 1-2 weeks Please ensure follow-up with infectious disease.   Admitted From:  Home  Disposition: Home   Discharge Condition: good  CODE STATUS:   Code Status: Full Code   Diet recommendation:  Diet Order             Diet general           Diet regular Room service appropriate? Yes; Fluid consistency: Thin  Diet effective now                    Brief Summary: Patient is a 47 y.o.  female with history of HLD-who presented with intractable back pain following a recent trip to [place] (ongoing since 9/13)-outpatient MRI showed L5-S1 discitis/osteomyelitis-she was then admitted to [facility].  Post admission-blood cultures positive for MSSA.   Significant events: 9/27>> admit to [facility]   Significant studies: 9/27>> MRI lumbar spine: Discitis/osteomyelitis at L5-S1. 9/27>> RUQ ultrasound: S/p cholecystectomy-no acute lesions. 9/28>> MRI thoracic spine: Negative for osteomyelitis. 9/28>> echo: EF 60-65%, bicuspid aortic valve 9/30>> TEE: No endocarditis   Significant microbiology data: 9/27>> blood culture: MSSA 9/29>> blood culture: Negative 9/30>> blood culture: Negative   Procedures: 9/30>> TEE: No endocarditis 10/2>>PICC line   Consults: ID IR  Brief Hospital Course: MSSA bacteremia with L5-S1 osteomyelitis/discitis Gradually improved with IV antibiotics-afebrile-leukocytosis has almost normalized  Repeat blood cultures on 9/29 and 9/13 negative so far TEE negative for endocarditis PICC line to be placed 10/2 Infectious disease followed closely-plans are for IV Ancef until 08/13/2023 Repeat MRI lumbosacral spine planned in few weeks as an outpatient Please ensure  follow-up with infectious disease.   Transaminitis Probably due to MSSA bacteremia Acute hepatitis serology negative RUQ ultrasound nonacute LFTs downtrending Repeat LFTs in 1 week-and if stable-okay to resume statin.   Thrombocytosis Secondary to inflammation from bacteremia Supportive care-follow CBC periodically   HLD Holding statin-see above   GERD:  PPI   Peripheral neuropathy Neurontin   Constipation Likely due to narcotics Had BM on 9/30 following Dulcolax suppository Continue MiraLAX/senna   Obesity: Estimated body mass index is 31.63 kg/m as calculated from the following:   Height as of this encounter: 5\' 8"  (1.727 m).   Weight as of this encounter: 94.3 kg.   Nutrition Status: Nutrition Problem: Moderate Malnutrition Etiology: acute illness (discitis/osteomulitis) Signs/Symptoms: energy intake < or equal to 50% for > or equal to 5 days, moderate fat depletion, mild muscle depletion Interventions: Ensure Enlive (each supplement provides 350kcal and 20 grams of protein)    Discharge Diagnoses:  Principal Problem:   Discitis Active Problems:   Bacteremia with signs of infection   Malnutrition of moderate degree   Staphylococcus aureus infection   Discharge Instructions:  Activity:  As tolerated with Full fall precautions use walker/cane & assistance as needed  Discharge Instructions     [facility] pharmacist to adjust dose for Vancomycin, Aminoglycosides and other anti-infective therapies as requested by physician.   Complete by: As directed    [facility] to provide Cath Flo 2mg    Complete by: As directed    Administer for PICC line occlusion and as ordered by physician for other access device issues.   Anaphylaxis Kit: Provided to treat any anaphylactic reaction  07/03/2022, SpO2 98%.  Intake/Output Summary (Last 24 hours) at 06/20/2023 0934 Last data filed at 06/20/2023 0454 Gross per 24 hour  Intake 1516.72 ml  Output --  Net 1516.72 ml   Filed Weights   06/15/23 1249  Weight: 94.3 kg    Exam: Awake Alert, Oriented *3, No new F.N deficits, Normal affect .AT,PERRAL Supple Neck,No JVD, No cervical lymphadenopathy appriciated.  Symmetrical Chest wall movement, Good air movement bilaterally, CTAB RRR,No Gallops,Rubs or new Murmurs, No Parasternal Heave +ve B.Sounds, Abd Soft, Non tender, No organomegaly appriciated, No rebound -guarding or rigidity. No Cyanosis, Clubbing or edema, No new Rash or bruise   PERTINENT RADIOLOGIC STUDIES: [place] EKG SITE RITE  Result Date: 06/19/2023 If Site Rite image not attached, placement could not be confirmed due to current cardiac rhythm.  ECHO TEE  Result Date: 06/18/2023    TRANSESOPHOGEAL ECHO REPORT   Patient Name:   Susan [name] [facility] Date of Exam: 06/18/2023 Medical Rec #:  [id number]             Height:       68.0 in Accession #:    [id number]           Weight:       208.0 lb Date of Birth:  1975/10/16            BSA:          2.078 m Patient Age:    47 years             BP:           123/79 mmHg Patient Gender: F                    HR:           93 bpm. Exam Location:  Inpatient Procedure: Transesophageal Echo, Color Doppler, Cardiac Doppler and 3D Echo Indications:     Bacteremia  History:         Patient has prior history of Echocardiogram examinations, most                  recent 06/16/2023. Risk Factors:Dyslipidemia.  Sonographer:     Delcie Roch RDCS Referring Phys:  [id number] Cyndi Bender Diagnosing Phys: Charlton Haws MD PROCEDURE: After discussion of the risks and benefits of a TEE, an informed consent was obtained from the patient. The transesophogeal probe was passed without difficulty through the esophogus of the patient. Imaged were obtained with the patient in a left lateral decubitus position. Sedation performed by different physician. The patient was monitored while under deep sedation. Anesthestetic sedation was provided intravenously by Anesthesiology: 300mg  of Propofol, 100mg  of Lidocaine. The patient's vital signs; including heart rate, blood pressure, and oxygen saturation; remained stable throughout the procedure. The patient developed no complications during the procedure.  IMPRESSIONS  1. Bicuspid AV No evidence of SBE/vegetations.  2. Left ventricular ejection fraction, by estimation, is 60 to 65%. The left ventricle has normal function. The left ventricle has no regional wall motion abnormalities.  3. Right ventricular systolic function is normal. The right ventricular size is normal.  4. No left atrial/left atrial appendage thrombus was detected.  5. The mitral valve is normal in structure. Trivial mitral valve regurgitation. No evidence of mitral stenosis.  6. Sievers 1 fused right /left cusps. . The aortic valve is bicuspid. Aortic valve regurgitation is trivial. No aortic  stenosis is present.  7. Aortic dilatation noted. There is mild dilatation of the ascending  Follow-up Information     Jarold Motto, [name]. Schedule an appointment as soon as possible for a visit in 1 week(s).   Specialty: Physician Assistant Contact information: [address] [place] [address] [phone]         Judyann Munson, MD Follow up.   Specialty: Infectious Diseases Why: Hospital follow up, Office will call with date/time, If you dont hear from them,please give them a call Contact information: [address] [place] [address] [phone]                No Known Allergies   Other Procedures/Studies: [place] EKG SITE RITE  Result Date: 06/19/2023 If Site Rite image not attached, placement could not be confirmed due to current cardiac rhythm.  ECHO TEE  Result Date: 06/18/2023    TRANSESOPHOGEAL ECHO REPORT   Patient Name:   NYJAE HODGE [facility] Date of Exam: 06/18/2023 Medical Rec #:  [id number]            Height:       68.0 in Accession #:    [id number]           Weight:       208.0 lb Date of Birth:  11-20-1975            BSA:          2.078 m Patient Age:    47 years             BP:           123/79 mmHg Patient Gender: F                    HR:            93 bpm. Exam Location:  Inpatient Procedure: Transesophageal Echo, Color Doppler, Cardiac Doppler and 3D Echo Indications:     Bacteremia  History:         Patient has prior history of Echocardiogram examinations, most                  recent 06/16/2023. Risk Factors:Dyslipidemia.  Sonographer:     Delcie Roch RDCS Referring Phys:  [id number] Cyndi Bender Diagnosing Phys: Charlton Haws MD PROCEDURE: After discussion of the risks and benefits of a TEE, an informed consent was obtained from the patient. The transesophogeal probe was passed without difficulty through the esophogus of the patient. Imaged were obtained with the patient in a left lateral decubitus position. Sedation performed by different physician. The patient was monitored while under deep sedation. Anesthestetic sedation was provided intravenously by Anesthesiology: 300mg  of Propofol, 100mg  of Lidocaine. The patient's vital signs; including heart rate, blood pressure, and oxygen saturation; remained stable throughout the procedure. The patient developed no complications during the procedure.  IMPRESSIONS  1. Bicuspid AV No evidence of SBE/vegetations.  2. Left ventricular ejection fraction, by estimation, is 60 to 65%. The left ventricle has normal function. The left ventricle has no regional wall motion abnormalities.  3. Right ventricular systolic function is normal. The right ventricular size is normal.  4. No left atrial/left atrial appendage thrombus was detected.  5. The mitral valve is normal in structure. Trivial mitral valve regurgitation. No evidence of mitral stenosis.  6. Sievers 1 fused right /left cusps. . The aortic valve is bicuspid. Aortic valve regurgitation is trivial. No aortic stenosis is present.  7. Aortic dilatation noted. There is mild dilatation of the ascending aorta, measuring 38 mm.  8. The  07/03/2022, SpO2 98%.  Intake/Output Summary (Last 24 hours) at 06/20/2023 0934 Last data filed at 06/20/2023 0454 Gross per 24 hour  Intake 1516.72 ml  Output --  Net 1516.72 ml   Filed Weights   06/15/23 1249  Weight: 94.3 kg    Exam: Awake Alert, Oriented *3, No new F.N deficits, Normal affect .AT,PERRAL Supple Neck,No JVD, No cervical lymphadenopathy appriciated.  Symmetrical Chest wall movement, Good air movement bilaterally, CTAB RRR,No Gallops,Rubs or new Murmurs, No Parasternal Heave +ve B.Sounds, Abd Soft, Non tender, No organomegaly appriciated, No rebound -guarding or rigidity. No Cyanosis, Clubbing or edema, No new Rash or bruise   PERTINENT RADIOLOGIC STUDIES: [place] EKG SITE RITE  Result Date: 06/19/2023 If Site Rite image not attached, placement could not be confirmed due to current cardiac rhythm.  ECHO TEE  Result Date: 06/18/2023    TRANSESOPHOGEAL ECHO REPORT   Patient Name:   Susan [name] [facility] Date of Exam: 06/18/2023 Medical Rec #:  [id number]             Height:       68.0 in Accession #:    [id number]           Weight:       208.0 lb Date of Birth:  1975/10/16            BSA:          2.078 m Patient Age:    47 years             BP:           123/79 mmHg Patient Gender: F                    HR:           93 bpm. Exam Location:  Inpatient Procedure: Transesophageal Echo, Color Doppler, Cardiac Doppler and 3D Echo Indications:     Bacteremia  History:         Patient has prior history of Echocardiogram examinations, most                  recent 06/16/2023. Risk Factors:Dyslipidemia.  Sonographer:     Delcie Roch RDCS Referring Phys:  [id number] Cyndi Bender Diagnosing Phys: Charlton Haws MD PROCEDURE: After discussion of the risks and benefits of a TEE, an informed consent was obtained from the patient. The transesophogeal probe was passed without difficulty through the esophogus of the patient. Imaged were obtained with the patient in a left lateral decubitus position. Sedation performed by different physician. The patient was monitored while under deep sedation. Anesthestetic sedation was provided intravenously by Anesthesiology: 300mg  of Propofol, 100mg  of Lidocaine. The patient's vital signs; including heart rate, blood pressure, and oxygen saturation; remained stable throughout the procedure. The patient developed no complications during the procedure.  IMPRESSIONS  1. Bicuspid AV No evidence of SBE/vegetations.  2. Left ventricular ejection fraction, by estimation, is 60 to 65%. The left ventricle has normal function. The left ventricle has no regional wall motion abnormalities.  3. Right ventricular systolic function is normal. The right ventricular size is normal.  4. No left atrial/left atrial appendage thrombus was detected.  5. The mitral valve is normal in structure. Trivial mitral valve regurgitation. No evidence of mitral stenosis.  6. Sievers 1 fused right /left cusps. . The aortic valve is bicuspid. Aortic valve regurgitation is trivial. No aortic  stenosis is present.  7. Aortic dilatation noted. There is mild dilatation of the ascending  Follow-up Information     Jarold Motto, [name]. Schedule an appointment as soon as possible for a visit in 1 week(s).   Specialty: Physician Assistant Contact information: [address] [place] [address] [phone]         Judyann Munson, MD Follow up.   Specialty: Infectious Diseases Why: Hospital follow up, Office will call with date/time, If you dont hear from them,please give them a call Contact information: [address] [place] [address] [phone]                No Known Allergies   Other Procedures/Studies: [place] EKG SITE RITE  Result Date: 06/19/2023 If Site Rite image not attached, placement could not be confirmed due to current cardiac rhythm.  ECHO TEE  Result Date: 06/18/2023    TRANSESOPHOGEAL ECHO REPORT   Patient Name:   NYJAE HODGE [facility] Date of Exam: 06/18/2023 Medical Rec #:  [id number]            Height:       68.0 in Accession #:    [id number]           Weight:       208.0 lb Date of Birth:  11-20-1975            BSA:          2.078 m Patient Age:    47 years             BP:           123/79 mmHg Patient Gender: F                    HR:            93 bpm. Exam Location:  Inpatient Procedure: Transesophageal Echo, Color Doppler, Cardiac Doppler and 3D Echo Indications:     Bacteremia  History:         Patient has prior history of Echocardiogram examinations, most                  recent 06/16/2023. Risk Factors:Dyslipidemia.  Sonographer:     Delcie Roch RDCS Referring Phys:  [id number] Cyndi Bender Diagnosing Phys: Charlton Haws MD PROCEDURE: After discussion of the risks and benefits of a TEE, an informed consent was obtained from the patient. The transesophogeal probe was passed without difficulty through the esophogus of the patient. Imaged were obtained with the patient in a left lateral decubitus position. Sedation performed by different physician. The patient was monitored while under deep sedation. Anesthestetic sedation was provided intravenously by Anesthesiology: 300mg  of Propofol, 100mg  of Lidocaine. The patient's vital signs; including heart rate, blood pressure, and oxygen saturation; remained stable throughout the procedure. The patient developed no complications during the procedure.  IMPRESSIONS  1. Bicuspid AV No evidence of SBE/vegetations.  2. Left ventricular ejection fraction, by estimation, is 60 to 65%. The left ventricle has normal function. The left ventricle has no regional wall motion abnormalities.  3. Right ventricular systolic function is normal. The right ventricular size is normal.  4. No left atrial/left atrial appendage thrombus was detected.  5. The mitral valve is normal in structure. Trivial mitral valve regurgitation. No evidence of mitral stenosis.  6. Sievers 1 fused right /left cusps. . The aortic valve is bicuspid. Aortic valve regurgitation is trivial. No aortic stenosis is present.  7. Aortic dilatation noted. There is mild dilatation of the ascending aorta, measuring 38 mm.  8. The  68.0 in Accession #:    [id number]           Weight:       208.0 lb Date of Birth:  [birth year]            BSA:          2.078 m Patient Age:    47 years             BP:           146/92 mmHg Patient Gender: F                    HR:           83 bpm. Exam Location:  Inpatient Procedure: 2D Echo, Color  Doppler and Cardiac Doppler Indications:     Bacteremia  History:         Patient has prior history of Echocardiogram examinations, most                  recent 07/05/2022. Discitis, Ascending Ao Dilation; Risk                  Factors:Dyslipidemia.  Sonographer:     Milbert Coulter Referring Phys:  [id number] Daylene Katayama [facility] Diagnosing Phys: [place] IMPRESSIONS  1. Left ventricular ejection fraction, by estimation, is 60 to 65%. The left ventricle has normal function. The left ventricle has no regional wall motion abnormalities. Left ventricular diastolic parameters were normal.  2. Right ventricular systolic function is normal. The right ventricular size is normal.  3. The mitral valve was not well visualized. Mild mitral valve regurgitation.  4. The RCC appears thickened; c/f bicuspid AV seen on prior echo. The aortic valve is tricuspid. Aortic valve regurgitation is mild.  5. There is mild dilatation of the ascending aorta, measuring 41 mm.  6. The inferior vena cava not well visualized. FINDINGS  Left Ventricle: Left ventricular ejection fraction, by estimation, is 60 to 65%. The left ventricle has normal function. The left ventricle has no regional wall motion abnormalities. The left ventricular internal cavity size was normal in size. There is  no left ventricular hypertrophy. Left ventricular diastolic parameters were normal. Right Ventricle: The right ventricular size is normal. Right ventricular systolic function is normal. Left Atrium: Left atrial size was normal in size. Right Atrium: Right atrial size was normal in size. Pericardium: There is no evidence of pericardial effusion. Mitral Valve: The mitral valve was not well visualized. Mild mitral valve regurgitation. Tricuspid Valve: Tricuspid valve regurgitation is not demonstrated. Aortic Valve: The RCC appears thickened; c/f bicuspid AV seen on prior echo. The aortic valve is tricuspid. Aortic valve regurgitation is mild. Aortic valve mean gradient  measures 8.0 mmHg. Aortic valve peak gradient measures 14.6 mmHg. Aortic valve area, by VTI measures 2.62 cm. Pulmonic Valve: Pulmonic valve regurgitation is not visualized. Aorta: There is mild dilatation of the ascending aorta, measuring 41 mm. Venous: The inferior vena cava not well visualized. IAS/Shunts: No atrial level shunt detected by color flow Doppler.  LEFT VENTRICLE PLAX 2D LVIDd:         4.00 cm   Diastology LVIDs:         2.50 cm   LV e' medial:    7.94 cm/s LV PW:         0.90 cm   LV E/e' medial:  14.9 LV IVS:        1.00 cm   LV e' lateral:  07/03/2022, SpO2 98%.  Intake/Output Summary (Last 24 hours) at 06/20/2023 0934 Last data filed at 06/20/2023 0454 Gross per 24 hour  Intake 1516.72 ml  Output --  Net 1516.72 ml   Filed Weights   06/15/23 1249  Weight: 94.3 kg    Exam: Awake Alert, Oriented *3, No new F.N deficits, Normal affect .AT,PERRAL Supple Neck,No JVD, No cervical lymphadenopathy appriciated.  Symmetrical Chest wall movement, Good air movement bilaterally, CTAB RRR,No Gallops,Rubs or new Murmurs, No Parasternal Heave +ve B.Sounds, Abd Soft, Non tender, No organomegaly appriciated, No rebound -guarding or rigidity. No Cyanosis, Clubbing or edema, No new Rash or bruise   PERTINENT RADIOLOGIC STUDIES: [place] EKG SITE RITE  Result Date: 06/19/2023 If Site Rite image not attached, placement could not be confirmed due to current cardiac rhythm.  ECHO TEE  Result Date: 06/18/2023    TRANSESOPHOGEAL ECHO REPORT   Patient Name:   Susan [name] [facility] Date of Exam: 06/18/2023 Medical Rec #:  [id number]             Height:       68.0 in Accession #:    [id number]           Weight:       208.0 lb Date of Birth:  1975/10/16            BSA:          2.078 m Patient Age:    47 years             BP:           123/79 mmHg Patient Gender: F                    HR:           93 bpm. Exam Location:  Inpatient Procedure: Transesophageal Echo, Color Doppler, Cardiac Doppler and 3D Echo Indications:     Bacteremia  History:         Patient has prior history of Echocardiogram examinations, most                  recent 06/16/2023. Risk Factors:Dyslipidemia.  Sonographer:     Delcie Roch RDCS Referring Phys:  [id number] Cyndi Bender Diagnosing Phys: Charlton Haws MD PROCEDURE: After discussion of the risks and benefits of a TEE, an informed consent was obtained from the patient. The transesophogeal probe was passed without difficulty through the esophogus of the patient. Imaged were obtained with the patient in a left lateral decubitus position. Sedation performed by different physician. The patient was monitored while under deep sedation. Anesthestetic sedation was provided intravenously by Anesthesiology: 300mg  of Propofol, 100mg  of Lidocaine. The patient's vital signs; including heart rate, blood pressure, and oxygen saturation; remained stable throughout the procedure. The patient developed no complications during the procedure.  IMPRESSIONS  1. Bicuspid AV No evidence of SBE/vegetations.  2. Left ventricular ejection fraction, by estimation, is 60 to 65%. The left ventricle has normal function. The left ventricle has no regional wall motion abnormalities.  3. Right ventricular systolic function is normal. The right ventricular size is normal.  4. No left atrial/left atrial appendage thrombus was detected.  5. The mitral valve is normal in structure. Trivial mitral valve regurgitation. No evidence of mitral stenosis.  6. Sievers 1 fused right /left cusps. . The aortic valve is bicuspid. Aortic valve regurgitation is trivial. No aortic  stenosis is present.  7. Aortic dilatation noted. There is mild dilatation of the ascending  Follow-up Information     Jarold Motto, [name]. Schedule an appointment as soon as possible for a visit in 1 week(s).   Specialty: Physician Assistant Contact information: [address] [place] [address] [phone]         Judyann Munson, MD Follow up.   Specialty: Infectious Diseases Why: Hospital follow up, Office will call with date/time, If you dont hear from them,please give them a call Contact information: [address] [place] [address] [phone]                No Known Allergies   Other Procedures/Studies: [place] EKG SITE RITE  Result Date: 06/19/2023 If Site Rite image not attached, placement could not be confirmed due to current cardiac rhythm.  ECHO TEE  Result Date: 06/18/2023    TRANSESOPHOGEAL ECHO REPORT   Patient Name:   NYJAE HODGE [facility] Date of Exam: 06/18/2023 Medical Rec #:  [id number]            Height:       68.0 in Accession #:    [id number]           Weight:       208.0 lb Date of Birth:  11-20-1975            BSA:          2.078 m Patient Age:    47 years             BP:           123/79 mmHg Patient Gender: F                    HR:            93 bpm. Exam Location:  Inpatient Procedure: Transesophageal Echo, Color Doppler, Cardiac Doppler and 3D Echo Indications:     Bacteremia  History:         Patient has prior history of Echocardiogram examinations, most                  recent 06/16/2023. Risk Factors:Dyslipidemia.  Sonographer:     Delcie Roch RDCS Referring Phys:  [id number] Cyndi Bender Diagnosing Phys: Charlton Haws MD PROCEDURE: After discussion of the risks and benefits of a TEE, an informed consent was obtained from the patient. The transesophogeal probe was passed without difficulty through the esophogus of the patient. Imaged were obtained with the patient in a left lateral decubitus position. Sedation performed by different physician. The patient was monitored while under deep sedation. Anesthestetic sedation was provided intravenously by Anesthesiology: 300mg  of Propofol, 100mg  of Lidocaine. The patient's vital signs; including heart rate, blood pressure, and oxygen saturation; remained stable throughout the procedure. The patient developed no complications during the procedure.  IMPRESSIONS  1. Bicuspid AV No evidence of SBE/vegetations.  2. Left ventricular ejection fraction, by estimation, is 60 to 65%. The left ventricle has normal function. The left ventricle has no regional wall motion abnormalities.  3. Right ventricular systolic function is normal. The right ventricular size is normal.  4. No left atrial/left atrial appendage thrombus was detected.  5. The mitral valve is normal in structure. Trivial mitral valve regurgitation. No evidence of mitral stenosis.  6. Sievers 1 fused right /left cusps. . The aortic valve is bicuspid. Aortic valve regurgitation is trivial. No aortic stenosis is present.  7. Aortic dilatation noted. There is mild dilatation of the ascending aorta, measuring 38 mm.  8. The  Follow-up Information     Jarold Motto, [name]. Schedule an appointment as soon as possible for a visit in 1 week(s).   Specialty: Physician Assistant Contact information: [address] [place] [address] [phone]         Judyann Munson, MD Follow up.   Specialty: Infectious Diseases Why: Hospital follow up, Office will call with date/time, If you dont hear from them,please give them a call Contact information: [address] [place] [address] [phone]                No Known Allergies   Other Procedures/Studies: [place] EKG SITE RITE  Result Date: 06/19/2023 If Site Rite image not attached, placement could not be confirmed due to current cardiac rhythm.  ECHO TEE  Result Date: 06/18/2023    TRANSESOPHOGEAL ECHO REPORT   Patient Name:   NYJAE HODGE [facility] Date of Exam: 06/18/2023 Medical Rec #:  [id number]            Height:       68.0 in Accession #:    [id number]           Weight:       208.0 lb Date of Birth:  11-20-1975            BSA:          2.078 m Patient Age:    47 years             BP:           123/79 mmHg Patient Gender: F                    HR:            93 bpm. Exam Location:  Inpatient Procedure: Transesophageal Echo, Color Doppler, Cardiac Doppler and 3D Echo Indications:     Bacteremia  History:         Patient has prior history of Echocardiogram examinations, most                  recent 06/16/2023. Risk Factors:Dyslipidemia.  Sonographer:     Delcie Roch RDCS Referring Phys:  [id number] Cyndi Bender Diagnosing Phys: Charlton Haws MD PROCEDURE: After discussion of the risks and benefits of a TEE, an informed consent was obtained from the patient. The transesophogeal probe was passed without difficulty through the esophogus of the patient. Imaged were obtained with the patient in a left lateral decubitus position. Sedation performed by different physician. The patient was monitored while under deep sedation. Anesthestetic sedation was provided intravenously by Anesthesiology: 300mg  of Propofol, 100mg  of Lidocaine. The patient's vital signs; including heart rate, blood pressure, and oxygen saturation; remained stable throughout the procedure. The patient developed no complications during the procedure.  IMPRESSIONS  1. Bicuspid AV No evidence of SBE/vegetations.  2. Left ventricular ejection fraction, by estimation, is 60 to 65%. The left ventricle has normal function. The left ventricle has no regional wall motion abnormalities.  3. Right ventricular systolic function is normal. The right ventricular size is normal.  4. No left atrial/left atrial appendage thrombus was detected.  5. The mitral valve is normal in structure. Trivial mitral valve regurgitation. No evidence of mitral stenosis.  6. Sievers 1 fused right /left cusps. . The aortic valve is bicuspid. Aortic valve regurgitation is trivial. No aortic stenosis is present.  7. Aortic dilatation noted. There is mild dilatation of the ascending aorta, measuring 38 mm.  8. The  Follow-up Information     Jarold Motto, [name]. Schedule an appointment as soon as possible for a visit in 1 week(s).   Specialty: Physician Assistant Contact information: [address] [place] [address] [phone]         Judyann Munson, MD Follow up.   Specialty: Infectious Diseases Why: Hospital follow up, Office will call with date/time, If you dont hear from them,please give them a call Contact information: [address] [place] [address] [phone]                No Known Allergies   Other Procedures/Studies: [place] EKG SITE RITE  Result Date: 06/19/2023 If Site Rite image not attached, placement could not be confirmed due to current cardiac rhythm.  ECHO TEE  Result Date: 06/18/2023    TRANSESOPHOGEAL ECHO REPORT   Patient Name:   NYJAE HODGE [facility] Date of Exam: 06/18/2023 Medical Rec #:  [id number]            Height:       68.0 in Accession #:    [id number]           Weight:       208.0 lb Date of Birth:  11-20-1975            BSA:          2.078 m Patient Age:    47 years             BP:           123/79 mmHg Patient Gender: F                    HR:            93 bpm. Exam Location:  Inpatient Procedure: Transesophageal Echo, Color Doppler, Cardiac Doppler and 3D Echo Indications:     Bacteremia  History:         Patient has prior history of Echocardiogram examinations, most                  recent 06/16/2023. Risk Factors:Dyslipidemia.  Sonographer:     Delcie Roch RDCS Referring Phys:  [id number] Cyndi Bender Diagnosing Phys: Charlton Haws MD PROCEDURE: After discussion of the risks and benefits of a TEE, an informed consent was obtained from the patient. The transesophogeal probe was passed without difficulty through the esophogus of the patient. Imaged were obtained with the patient in a left lateral decubitus position. Sedation performed by different physician. The patient was monitored while under deep sedation. Anesthestetic sedation was provided intravenously by Anesthesiology: 300mg  of Propofol, 100mg  of Lidocaine. The patient's vital signs; including heart rate, blood pressure, and oxygen saturation; remained stable throughout the procedure. The patient developed no complications during the procedure.  IMPRESSIONS  1. Bicuspid AV No evidence of SBE/vegetations.  2. Left ventricular ejection fraction, by estimation, is 60 to 65%. The left ventricle has normal function. The left ventricle has no regional wall motion abnormalities.  3. Right ventricular systolic function is normal. The right ventricular size is normal.  4. No left atrial/left atrial appendage thrombus was detected.  5. The mitral valve is normal in structure. Trivial mitral valve regurgitation. No evidence of mitral stenosis.  6. Sievers 1 fused right /left cusps. . The aortic valve is bicuspid. Aortic valve regurgitation is trivial. No aortic stenosis is present.  7. Aortic dilatation noted. There is mild dilatation of the ascending aorta, measuring 38 mm.  8. The  07/03/2022, SpO2 98%.  Intake/Output Summary (Last 24 hours) at 06/20/2023 0934 Last data filed at 06/20/2023 0454 Gross per 24 hour  Intake 1516.72 ml  Output --  Net 1516.72 ml   Filed Weights   06/15/23 1249  Weight: 94.3 kg    Exam: Awake Alert, Oriented *3, No new F.N deficits, Normal affect .AT,PERRAL Supple Neck,No JVD, No cervical lymphadenopathy appriciated.  Symmetrical Chest wall movement, Good air movement bilaterally, CTAB RRR,No Gallops,Rubs or new Murmurs, No Parasternal Heave +ve B.Sounds, Abd Soft, Non tender, No organomegaly appriciated, No rebound -guarding or rigidity. No Cyanosis, Clubbing or edema, No new Rash or bruise   PERTINENT RADIOLOGIC STUDIES: [place] EKG SITE RITE  Result Date: 06/19/2023 If Site Rite image not attached, placement could not be confirmed due to current cardiac rhythm.  ECHO TEE  Result Date: 06/18/2023    TRANSESOPHOGEAL ECHO REPORT   Patient Name:   Susan [name] [facility] Date of Exam: 06/18/2023 Medical Rec #:  [id number]             Height:       68.0 in Accession #:    [id number]           Weight:       208.0 lb Date of Birth:  1975/10/16            BSA:          2.078 m Patient Age:    47 years             BP:           123/79 mmHg Patient Gender: F                    HR:           93 bpm. Exam Location:  Inpatient Procedure: Transesophageal Echo, Color Doppler, Cardiac Doppler and 3D Echo Indications:     Bacteremia  History:         Patient has prior history of Echocardiogram examinations, most                  recent 06/16/2023. Risk Factors:Dyslipidemia.  Sonographer:     Delcie Roch RDCS Referring Phys:  [id number] Cyndi Bender Diagnosing Phys: Charlton Haws MD PROCEDURE: After discussion of the risks and benefits of a TEE, an informed consent was obtained from the patient. The transesophogeal probe was passed without difficulty through the esophogus of the patient. Imaged were obtained with the patient in a left lateral decubitus position. Sedation performed by different physician. The patient was monitored while under deep sedation. Anesthestetic sedation was provided intravenously by Anesthesiology: 300mg  of Propofol, 100mg  of Lidocaine. The patient's vital signs; including heart rate, blood pressure, and oxygen saturation; remained stable throughout the procedure. The patient developed no complications during the procedure.  IMPRESSIONS  1. Bicuspid AV No evidence of SBE/vegetations.  2. Left ventricular ejection fraction, by estimation, is 60 to 65%. The left ventricle has normal function. The left ventricle has no regional wall motion abnormalities.  3. Right ventricular systolic function is normal. The right ventricular size is normal.  4. No left atrial/left atrial appendage thrombus was detected.  5. The mitral valve is normal in structure. Trivial mitral valve regurgitation. No evidence of mitral stenosis.  6. Sievers 1 fused right /left cusps. . The aortic valve is bicuspid. Aortic valve regurgitation is trivial. No aortic  stenosis is present.  7. Aortic dilatation noted. There is mild dilatation of the ascending  PATIENT DETAILS Name: Susan [name] Age: 47 y.o. Sex: female Date of Birth: January 05, 1976 MRN: [id number]. Admitting Physician: Leatha Gilding, MD NWG:NFAOZH, [place], [name]  Admit Date: 06/15/2023 Discharge date: 06/20/2023  Recommendations for Outpatient Follow-up:  Follow up with PCP in 1-2 weeks Please ensure follow-up with infectious disease.   Admitted From:  Home  Disposition: Home   Discharge Condition: good  CODE STATUS:   Code Status: Full Code   Diet recommendation:  Diet Order             Diet general           Diet regular Room service appropriate? Yes; Fluid consistency: Thin  Diet effective now                    Brief Summary: Patient is a 47 y.o.  female with history of HLD-who presented with intractable back pain following a recent trip to [place] (ongoing since 9/13)-outpatient MRI showed L5-S1 discitis/osteomyelitis-she was then admitted to [facility].  Post admission-blood cultures positive for MSSA.   Significant events: 9/27>> admit to [facility]   Significant studies: 9/27>> MRI lumbar spine: Discitis/osteomyelitis at L5-S1. 9/27>> RUQ ultrasound: S/p cholecystectomy-no acute lesions. 9/28>> MRI thoracic spine: Negative for osteomyelitis. 9/28>> echo: EF 60-65%, bicuspid aortic valve 9/30>> TEE: No endocarditis   Significant microbiology data: 9/27>> blood culture: MSSA 9/29>> blood culture: Negative 9/30>> blood culture: Negative   Procedures: 9/30>> TEE: No endocarditis 10/2>>PICC line   Consults: ID IR  Brief Hospital Course: MSSA bacteremia with L5-S1 osteomyelitis/discitis Gradually improved with IV antibiotics-afebrile-leukocytosis has almost normalized  Repeat blood cultures on 9/29 and 9/13 negative so far TEE negative for endocarditis PICC line to be placed 10/2 Infectious disease followed closely-plans are for IV Ancef until 08/13/2023 Repeat MRI lumbosacral spine planned in few weeks as an outpatient Please ensure  follow-up with infectious disease.   Transaminitis Probably due to MSSA bacteremia Acute hepatitis serology negative RUQ ultrasound nonacute LFTs downtrending Repeat LFTs in 1 week-and if stable-okay to resume statin.   Thrombocytosis Secondary to inflammation from bacteremia Supportive care-follow CBC periodically   HLD Holding statin-see above   GERD:  PPI   Peripheral neuropathy Neurontin   Constipation Likely due to narcotics Had BM on 9/30 following Dulcolax suppository Continue MiraLAX/senna   Obesity: Estimated body mass index is 31.63 kg/m as calculated from the following:   Height as of this encounter: 5\' 8"  (1.727 m).   Weight as of this encounter: 94.3 kg.   Nutrition Status: Nutrition Problem: Moderate Malnutrition Etiology: acute illness (discitis/osteomulitis) Signs/Symptoms: energy intake < or equal to 50% for > or equal to 5 days, moderate fat depletion, mild muscle depletion Interventions: Ensure Enlive (each supplement provides 350kcal and 20 grams of protein)    Discharge Diagnoses:  Principal Problem:   Discitis Active Problems:   Bacteremia with signs of infection   Malnutrition of moderate degree   Staphylococcus aureus infection   Discharge Instructions:  Activity:  As tolerated with Full fall precautions use walker/cane & assistance as needed  Discharge Instructions     [facility] pharmacist to adjust dose for Vancomycin, Aminoglycosides and other anti-infective therapies as requested by physician.   Complete by: As directed    [facility] to provide Cath Flo 2mg    Complete by: As directed    Administer for PICC line occlusion and as ordered by physician for other access device issues.   Anaphylaxis Kit: Provided to treat any anaphylactic reaction  PATIENT DETAILS Name: Susan [name] Age: 47 y.o. Sex: female Date of Birth: January 05, 1976 MRN: [id number]. Admitting Physician: Leatha Gilding, MD NWG:NFAOZH, [place], [name]  Admit Date: 06/15/2023 Discharge date: 06/20/2023  Recommendations for Outpatient Follow-up:  Follow up with PCP in 1-2 weeks Please ensure follow-up with infectious disease.   Admitted From:  Home  Disposition: Home   Discharge Condition: good  CODE STATUS:   Code Status: Full Code   Diet recommendation:  Diet Order             Diet general           Diet regular Room service appropriate? Yes; Fluid consistency: Thin  Diet effective now                    Brief Summary: Patient is a 47 y.o.  female with history of HLD-who presented with intractable back pain following a recent trip to [place] (ongoing since 9/13)-outpatient MRI showed L5-S1 discitis/osteomyelitis-she was then admitted to [facility].  Post admission-blood cultures positive for MSSA.   Significant events: 9/27>> admit to [facility]   Significant studies: 9/27>> MRI lumbar spine: Discitis/osteomyelitis at L5-S1. 9/27>> RUQ ultrasound: S/p cholecystectomy-no acute lesions. 9/28>> MRI thoracic spine: Negative for osteomyelitis. 9/28>> echo: EF 60-65%, bicuspid aortic valve 9/30>> TEE: No endocarditis   Significant microbiology data: 9/27>> blood culture: MSSA 9/29>> blood culture: Negative 9/30>> blood culture: Negative   Procedures: 9/30>> TEE: No endocarditis 10/2>>PICC line   Consults: ID IR  Brief Hospital Course: MSSA bacteremia with L5-S1 osteomyelitis/discitis Gradually improved with IV antibiotics-afebrile-leukocytosis has almost normalized  Repeat blood cultures on 9/29 and 9/13 negative so far TEE negative for endocarditis PICC line to be placed 10/2 Infectious disease followed closely-plans are for IV Ancef until 08/13/2023 Repeat MRI lumbosacral spine planned in few weeks as an outpatient Please ensure  follow-up with infectious disease.   Transaminitis Probably due to MSSA bacteremia Acute hepatitis serology negative RUQ ultrasound nonacute LFTs downtrending Repeat LFTs in 1 week-and if stable-okay to resume statin.   Thrombocytosis Secondary to inflammation from bacteremia Supportive care-follow CBC periodically   HLD Holding statin-see above   GERD:  PPI   Peripheral neuropathy Neurontin   Constipation Likely due to narcotics Had BM on 9/30 following Dulcolax suppository Continue MiraLAX/senna   Obesity: Estimated body mass index is 31.63 kg/m as calculated from the following:   Height as of this encounter: 5\' 8"  (1.727 m).   Weight as of this encounter: 94.3 kg.   Nutrition Status: Nutrition Problem: Moderate Malnutrition Etiology: acute illness (discitis/osteomulitis) Signs/Symptoms: energy intake < or equal to 50% for > or equal to 5 days, moderate fat depletion, mild muscle depletion Interventions: Ensure Enlive (each supplement provides 350kcal and 20 grams of protein)    Discharge Diagnoses:  Principal Problem:   Discitis Active Problems:   Bacteremia with signs of infection   Malnutrition of moderate degree   Staphylococcus aureus infection   Discharge Instructions:  Activity:  As tolerated with Full fall precautions use walker/cane & assistance as needed  Discharge Instructions     [facility] pharmacist to adjust dose for Vancomycin, Aminoglycosides and other anti-infective therapies as requested by physician.   Complete by: As directed    [facility] to provide Cath Flo 2mg    Complete by: As directed    Administer for PICC line occlusion and as ordered by physician for other access device issues.   Anaphylaxis Kit: Provided to treat any anaphylactic reaction  Follow-up Information     Jarold Motto, [name]. Schedule an appointment as soon as possible for a visit in 1 week(s).   Specialty: Physician Assistant Contact information: [address] [place] [address] [phone]         Judyann Munson, MD Follow up.   Specialty: Infectious Diseases Why: Hospital follow up, Office will call with date/time, If you dont hear from them,please give them a call Contact information: [address] [place] [address] [phone]                No Known Allergies   Other Procedures/Studies: [place] EKG SITE RITE  Result Date: 06/19/2023 If Site Rite image not attached, placement could not be confirmed due to current cardiac rhythm.  ECHO TEE  Result Date: 06/18/2023    TRANSESOPHOGEAL ECHO REPORT   Patient Name:   NYJAE HODGE [facility] Date of Exam: 06/18/2023 Medical Rec #:  [id number]            Height:       68.0 in Accession #:    [id number]           Weight:       208.0 lb Date of Birth:  11-20-1975            BSA:          2.078 m Patient Age:    47 years             BP:           123/79 mmHg Patient Gender: F                    HR:            93 bpm. Exam Location:  Inpatient Procedure: Transesophageal Echo, Color Doppler, Cardiac Doppler and 3D Echo Indications:     Bacteremia  History:         Patient has prior history of Echocardiogram examinations, most                  recent 06/16/2023. Risk Factors:Dyslipidemia.  Sonographer:     Delcie Roch RDCS Referring Phys:  [id number] Cyndi Bender Diagnosing Phys: Charlton Haws MD PROCEDURE: After discussion of the risks and benefits of a TEE, an informed consent was obtained from the patient. The transesophogeal probe was passed without difficulty through the esophogus of the patient. Imaged were obtained with the patient in a left lateral decubitus position. Sedation performed by different physician. The patient was monitored while under deep sedation. Anesthestetic sedation was provided intravenously by Anesthesiology: 300mg  of Propofol, 100mg  of Lidocaine. The patient's vital signs; including heart rate, blood pressure, and oxygen saturation; remained stable throughout the procedure. The patient developed no complications during the procedure.  IMPRESSIONS  1. Bicuspid AV No evidence of SBE/vegetations.  2. Left ventricular ejection fraction, by estimation, is 60 to 65%. The left ventricle has normal function. The left ventricle has no regional wall motion abnormalities.  3. Right ventricular systolic function is normal. The right ventricular size is normal.  4. No left atrial/left atrial appendage thrombus was detected.  5. The mitral valve is normal in structure. Trivial mitral valve regurgitation. No evidence of mitral stenosis.  6. Sievers 1 fused right /left cusps. . The aortic valve is bicuspid. Aortic valve regurgitation is trivial. No aortic stenosis is present.  7. Aortic dilatation noted. There is mild dilatation of the ascending aorta, measuring 38 mm.  8. The

## 2023-06-20 NOTE — Plan of Care (Signed)
  Problem: Nutrition: Goal: Adequate nutrition will be maintained Outcome: Progressing   Problem: Coping: Goal: Level of anxiety will decrease Outcome: Progressing   Problem: Elimination: Goal: Will not experience complications related to bowel motility Outcome: Progressing   Problem: Pain Managment: Goal: General experience of comfort will improve Outcome: Progressing   Problem: Safety: Goal: Ability to remain free from injury will improve 06/20/2023 0301 by Wilson Singer, RN Outcome: Progressing

## 2023-06-20 NOTE — Plan of Care (Signed)
  Problem: Nutrition: Goal: Adequate nutrition will be maintained Outcome: Progressing   Problem: Coping: Goal: Level of anxiety will decrease Outcome: Progressing   Problem: Coping: Goal: Level of anxiety will decrease Outcome: Progressing   Problem: Elimination: Goal: Will not experience complications related to bowel motility Outcome: Progressing   Problem: Safety: Goal: Ability to remain free from injury will improve Outcome: Progressing   Problem: Skin Integrity: Goal: Risk for impaired skin integrity will decrease Outcome: Progressing

## 2023-06-21 ENCOUNTER — Telehealth: Payer: Self-pay

## 2023-06-21 DIAGNOSIS — M4640 Discitis, unspecified, site unspecified: Secondary | ICD-10-CM | POA: Diagnosis not present

## 2023-06-21 NOTE — Transitions of Care (Post Inpatient/ED Visit) (Signed)
06/21/2023  Name: Susan [name] MRN: [id number] DOB: Dec 02, 1975  Today's TOC FU Call Status: Today's TOC FU Call Status:: Successful TOC FU Call Completed TOC FU Call Complete Date: 06/21/23 Patient's Name and Date of Birth confirmed.  Transition Care Management Follow-up Telephone Call Date of Discharge: 06/20/23 Discharge Facility: [facility]) Type of Discharge: Inpatient Admission Primary Inpatient Discharge Diagnosis:: "staphylococcus aureus infection" How have you been since you were released from the hospital?: Better (Pt voices she rested well last night-appetite fair-starting to eat solids now-LBM a few days ago-started on bowel regimen. PICC line intact-she has self-adminsitered two doses of abxs so far at home with no issues. Taking pain med & muscle relaxer) Any questions or concerns?: No  Items Reviewed: Did you receive and understand the discharge instructions provided?: Yes Medications obtained,verified, and reconciled?: Yes (Medications Reviewed) (Pt was not aware that she should not be taking Pravastatin per d/c paperwork and took a dose last evening. Reviewed with pt elevated liver enzymes and to hold med until levels return and advised by MD. She voiced understanding.) Any new allergies since your discharge?: No Dietary orders reviewed?: Yes Type of Diet Ordered:: low salt/heart healthy Do you have support at home?: Yes People in Home: spouse Name of Support/Comfort Primary Source: Debby Bud  Medications Reviewed Today: Medications Reviewed Today     Reviewed by Charlyn Minerva, RN (Registered Nurse) on 06/21/23 at 1046  Med List Status: <None>   Medication Order Taking? Sig Documenting Provider Last Dose Status Informant  bisacodyl (DULCOLAX) 10 MG suppository [id number]  Place 1 suppository (10 mg total) rectally daily as needed for moderate constipation. Maretta Bees, MD  Active   ceFAZolin (ANCEF) IVPB [id number] Yes Inject 2 g into the  vein every 8 (eight) hours. Indication:   MSSA discitis/epidural abscess First Dose: Yes Last Day of Therapy:  08/13/23 Labs - Once weekly:  CBC/D and BMP, Labs - Once weekly: ESR and CRP Method of administration: IV Push Method of administration may be changed at the discretion of home infusion pharmacist based upon assessment of the patient and/or caregiver's ability to self-administer the medication ordered. Maretta Bees, MD Taking Active   gabapentin (NEURONTIN) 300 MG capsule [id number] Yes Take 1 capsule (300 mg total) by mouth at bedtime.  Patient taking differently: Take 600 mg by mouth 3 (three) times daily.   Judi Saa, DO Taking Active Self  methocarbamol (ROBAXIN) 500 MG tablet [id number] Yes Take 1 tablet (500 mg total) by mouth every 8 (eight) hours as needed for muscle spasms. Maretta Bees, MD Taking Active   Multiple Vitamins-Minerals (MULTIVITAMIN PO) [id number] Yes Take by mouth daily. [provider] Taking Active Self  Oxycodone HCl 10 MG TABS [id number] Yes Take 1-1.5 tablets (10-15 mg total) by mouth every 6 (six) hours as needed. Maretta Bees, MD Taking Active   pantoprazole (PROTONIX) 40 MG tablet [id number] Yes TAKE 1 TABLET BY MOUTH EVERY DAY Jarold Motto, [name] Taking Active Self  polyethylene glycol powder (GLYCOLAX/MIRALAX) 17 GM/SCOOP powder [id number] Yes Take 1 capful (17 g) by mouth 2 (two) times daily. Maretta Bees, MD Taking Active   senna (SENOKOT) 8.6 MG TABS tablet [id number] Yes Take 2 tablets (17.2 mg total) by mouth at bedtime. Maretta Bees, MD Taking Active   TART CHERRY PO [id number] Yes Take by mouth. [provider] Taking Active Self  traZODone (DESYREL) 100 MG tablet [id number] Yes TAKE 1 TABLET BY MOUTH EVERYDAY AT BEDTIME  Patient taking differently: Take 100 mg by mouth at bedtime.   Jarold Motto, [name] Taking Active Self            Home Care and Equipment/Supplies: Were Home Health Services  Ordered?: No Name of Home Health Agency:: [facility]-RN will oversee pt self-adminsitering IV abxs-pt states RN visited her prior to discharge to perform teaching and available prn Any new equipment or medical supplies ordered?: Yes Name of Medical supply agency?: [facility]-IV abx and PICC line supplies Were you able to get the equipment/medical supplies?: Yes Do you have any questions related to the use of the equipment/supplies?: No  Functional Questionnaire: Do you need assistance with bathing/showering or dressing?: No Do you need assistance with meal preparation?: No Do you need assistance with eating?: No Do you have difficulty maintaining continence: No Do you need assistance with getting out of bed/getting out of a chair/moving?: No Do you have difficulty managing or taking your medications?: No  Follow up appointments reviewed: PCP Follow-up appointment confirmed?: Yes Date of PCP follow-up appointment?: 06/27/23 Follow-up Provider: Isidor Holts Specialist [facility] Follow-up appointment confirmed?: Yes Date of Specialist follow-up appointment?: 08/02/23 Follow-Up Specialty Provider:: Dr. Drue Second Do you need transportation to your follow-up appointment?: No (pt confirms she is able to get to appts) Do you understand care options if your condition(s) worsen?: Yes-patient verbalized understanding   TOC Interventions Today    Flowsheet Row Most Recent Value  TOC Interventions   TOC Interventions Discussed/Reviewed TOC Interventions Discussed, S/S of infection      Interventions Today    Flowsheet Row Most Recent Value  General Interventions   General Interventions Discussed/Reviewed General Interventions Discussed, Doctor Visits, Referral to Nurse  [pt able to amnage her care in the home-adminsitering her own IV abxs, low hosp/ED adm risk noted-doesn't need f/u calls-aware to call for any needs/concerns]  Doctor Visits Discussed/Reviewed Doctor Visits Discussed, PCP,  Specialist  PCP/Specialist Visits Compliance with follow-up visit  Education Interventions   Education Provided Provided Education  Provided Verbal Education On When to see the doctor, Nutrition, Other  [pain mgmt, bowel mgmt]  Nutrition Interventions   Nutrition Discussed/Reviewed Nutrition Discussed  Pharmacy Interventions   Pharmacy Dicussed/Reviewed Pharmacy Topics Discussed, Medications and their functions  Safety Interventions   Safety Discussed/Reviewed Safety Discussed        Antionette Fairy, RN,BSN,CCM RN Care Manager Transitions of Care  [facility]  Direct Phone: [phone] Toll Free: [phone] Fax: [phone]

## 2023-06-22 DIAGNOSIS — M4640 Discitis, unspecified, site unspecified: Secondary | ICD-10-CM | POA: Diagnosis not present

## 2023-06-22 LAB — CULTURE, BLOOD (ROUTINE X 2)
Culture: NO GROWTH
Special Requests: ADEQUATE

## 2023-06-23 DIAGNOSIS — M4640 Discitis, unspecified, site unspecified: Secondary | ICD-10-CM | POA: Diagnosis not present

## 2023-06-23 LAB — CULTURE, BLOOD (ROUTINE X 2)
Culture: NO GROWTH
Culture: NO GROWTH
Special Requests: ADEQUATE

## 2023-06-24 DIAGNOSIS — M4640 Discitis, unspecified, site unspecified: Secondary | ICD-10-CM | POA: Diagnosis not present

## 2023-06-25 DIAGNOSIS — M4640 Discitis, unspecified, site unspecified: Secondary | ICD-10-CM | POA: Diagnosis not present

## 2023-06-26 DIAGNOSIS — M4640 Discitis, unspecified, site unspecified: Secondary | ICD-10-CM | POA: Diagnosis not present

## 2023-06-26 NOTE — Progress Notes (Signed)
Susan [name] is a 47 y.o. female here for a hospital/ED follow-up.  History of Present Illness:   No chief complaint on file.   HPI  Discitis Admitted to hospital from 06/15/23 - 06/20/23 for discitis, bacteremia, malnutritoin, staphylococcus aureus infection. Given IV antibiotics. Repeat blood cultures negative. 06/18/23 transesophogeal echo showed mild dilatation of the ascending aorta, measuring 38 mm. 06/18/23 MRI thoracic spine showed no evidence of spinal infection in the thoracic region. 06/15/23 US abdomen RUG showed mild nodular contour to the liver may indicate cirrhosis.   Past Medical History:  Diagnosis Date   Abnormal pap 02/2003   CIN 2-3   Basal cell carcinoma of back 07/22/2021   GERD (gastroesophageal reflux disease)    Hyperlipidemia    Migraine with aura      Social History   Tobacco Use   Smoking status: Never   Smokeless tobacco: Never  Vaping Use   Vaping status: Never Used  Substance Use Topics   Alcohol use: Yes    Alcohol/week: 2.0 standard drinks of alcohol    Types: 2 Glasses of wine per week    Comment: 1-2 glasses/week   Drug use: No    Past Surgical History:  Procedure Laterality Date   BREAST BIOPSY Left 08/04/2022   [place] LT BREAST BX W LOC DEV 1ST LESION IMG BX SPEC US GUIDE 08/04/2022 [facility]   CERVICAL BIOPSY  W/ LOOP ELECTRODE EXCISION  02/2003   CIN 2-3   CHOLECYSTECTOMY  2015   COSMETIC SURGERY  2009   TEE WITHOUT CARDIOVERSION N/A 06/18/2023   Procedure: TRANSESOPHAGEAL ECHOCARDIOGRAM;  Surgeon: Wendall Stade, MD;  Location: [facility];  Service: Cardiovascular;  Laterality: N/A;    Family History  Problem Relation Age of Onset   HIV Mother    Other Mother    Early death Mother        Age 98   Hypertension Father    Depression Father        Suicide   Early death Brother        Age 56   Drug abuse Brother    Hypertension Maternal Grandmother    Diabetes Maternal Grandmother    Stroke Maternal  Grandmother    Heart failure Maternal Grandfather    Heart disease Maternal Grandfather    Diabetes Paternal Grandmother    Depression Paternal Grandfather        Suicide   Colon cancer Neg Hx    Colon polyps Neg Hx    Esophageal cancer Neg Hx    Rectal cancer Neg Hx    Stomach cancer Neg Hx     No Known Allergies  Current Medications:   Current Outpatient Medications:    bisacodyl (DULCOLAX) 10 MG suppository, Place 1 suppository (10 mg total) rectally daily as needed for moderate constipation., Disp: 12 suppository, Rfl: 0   ceFAZolin (ANCEF) IVPB, Inject 2 g into the vein every 8 (eight) hours. Indication:   MSSA discitis/epidural abscess First Dose: Yes Last Day of Therapy:  08/13/23 Labs - Once weekly:  CBC/D and BMP, Labs - Once weekly: ESR and CRP Method of administration: IV Push Method of administration may be changed at the discretion of home infusion pharmacist based upon assessment of the patient and/or caregiver's ability to self-administer the medication ordered., Disp: 165 Units, Rfl: 0   gabapentin (NEURONTIN) 300 MG capsule, Take 1 capsule (300 mg total) by mouth at bedtime. (Patient taking differently: Take 600 mg by mouth 3 (  three) times daily.), Disp: 90 capsule, Rfl: 0   methocarbamol (ROBAXIN) 500 MG tablet, Take 1 tablet (500 mg total) by mouth every 8 (eight) hours as needed for muscle spasms., Disp: 30 tablet, Rfl: 0   Multiple Vitamins-Minerals (MULTIVITAMIN PO), Take by mouth daily., Disp: , Rfl:    Oxycodone HCl 10 MG TABS, Take 1-1.5 tablets (10-15 mg total) by mouth every 6 (six) hours as needed., Disp: 30 tablet, Rfl: 0   pantoprazole (PROTONIX) 40 MG tablet, TAKE 1 TABLET BY MOUTH EVERY DAY, Disp: 90 tablet, Rfl: 1   polyethylene glycol powder (GLYCOLAX/MIRALAX) 17 GM/SCOOP powder, Take 1 capful (17 g) by mouth 2 (two) times daily., Disp: 238 g, Rfl: 0   senna (SENOKOT) 8.6 MG TABS tablet, Take 2 tablets (17.2 mg total) by mouth at bedtime., Disp: 120 tablet,  Rfl: 0   TART CHERRY PO, Take by mouth., Disp: , Rfl:    traZODone (DESYREL) 100 MG tablet, TAKE 1 TABLET BY MOUTH EVERYDAY AT BEDTIME (Patient taking differently: Take 100 mg by mouth at bedtime.), Disp: 90 tablet, Rfl: 1   Review of Systems:   ROS  Vitals:   There were no vitals filed for this visit.   There is no height or weight on file to calculate BMI.  Physical Exam:   Physical Exam  Assessment and Plan:   ***   I,Alexander Ruley,acting as a scribe for Jarold Motto, PA.,have documented all relevant documentation on the behalf of Jarold Motto, PA,as directed by  Jarold Motto, PA while in the presence of Jarold Motto, [name].   ***   Jarold Motto, PA-C

## 2023-06-27 ENCOUNTER — Ambulatory Visit (INDEPENDENT_AMBULATORY_CARE_PROVIDER_SITE_OTHER): Payer: BC Managed Care – PPO | Admitting: Physician Assistant

## 2023-06-27 ENCOUNTER — Encounter: Payer: Self-pay | Admitting: Physician Assistant

## 2023-06-27 ENCOUNTER — Other Ambulatory Visit: Payer: Self-pay | Admitting: Physician Assistant

## 2023-06-27 VITALS — BP 110/80 | HR 79 | Temp 98.0°F | Ht 68.0 in | Wt 205.0 lb

## 2023-06-27 DIAGNOSIS — M4647 Discitis, unspecified, lumbosacral region: Secondary | ICD-10-CM

## 2023-06-27 DIAGNOSIS — F4323 Adjustment disorder with mixed anxiety and depressed mood: Secondary | ICD-10-CM

## 2023-06-27 DIAGNOSIS — M4640 Discitis, unspecified, site unspecified: Secondary | ICD-10-CM | POA: Diagnosis not present

## 2023-06-27 DIAGNOSIS — M4626 Osteomyelitis of vertebra, lumbar region: Secondary | ICD-10-CM | POA: Diagnosis not present

## 2023-06-27 DIAGNOSIS — R7989 Other specified abnormal findings of blood chemistry: Secondary | ICD-10-CM

## 2023-06-27 LAB — COMPREHENSIVE METABOLIC PANEL
ALT: 9 U/L (ref 0–35)
AST: 24 U/L (ref 0–37)
Albumin: 3.9 g/dL (ref 3.5–5.2)
Alkaline Phosphatase: 261 U/L — ABNORMAL HIGH (ref 39–117)
BUN: 18 mg/dL (ref 6–23)
CO2: 29 meq/L (ref 19–32)
Calcium: 10 mg/dL (ref 8.4–10.5)
Chloride: 100 meq/L (ref 96–112)
Creatinine, Ser: 0.89 mg/dL (ref 0.40–1.20)
GFR: 76.72 mL/min (ref >60.00)
Glucose, Bld: 97 mg/dL (ref 70–99)
Potassium: 4.1 meq/L (ref 3.5–5.1)
Sodium: 137 meq/L (ref 135–145)
Total Bilirubin: 0.4 mg/dL (ref 0.2–1.2)
Total Protein: 7.4 g/dL (ref 6.0–8.3)

## 2023-06-27 MED ORDER — DICLOFENAC SODIUM 75 MG PO TBEC
75.0000 mg | DELAYED_RELEASE_TABLET | Freq: Two times a day (BID) | ORAL | 1 refills | Status: DC
Start: 2023-06-27 — End: 2023-08-27

## 2023-06-27 MED ORDER — OXYCODONE HCL 10 MG PO TABS: 10.0000 mg | ORAL_TABLET | Freq: Three times a day (TID) | ORAL | 0 refills | Status: DC | PRN

## 2023-06-27 MED ORDER — GABAPENTIN 300 MG PO CAPS: 300.0000 mg | ORAL_CAPSULE | Freq: Three times a day (TID) | ORAL | 1 refills | Status: DC

## 2023-06-27 NOTE — Therapy (Unsigned)
OUTPATIENT PHYSICAL THERAPY LOWER EXTREMITY EVALUATION   Patient Name: Susan [name] MRN: [id number] DOB:July 27, 1976, 47 y.o., female Today's Date: 06/27/2023  END OF SESSION:   Past Medical History:  Diagnosis Date   Abnormal pap 02/2003   CIN 2-3   Basal cell carcinoma of back 07/22/2021   GERD (gastroesophageal reflux disease)    Hyperlipidemia    Migraine with aura    Past Surgical History:  Procedure Laterality Date   BREAST BIOPSY Left 08/04/2022   [place] LT BREAST BX W LOC DEV 1ST LESION IMG BX SPEC US GUIDE 08/04/2022 [facility]   CERVICAL BIOPSY  W/ LOOP ELECTRODE EXCISION  02/2003   CIN 2-3   CHOLECYSTECTOMY  2015   COSMETIC SURGERY  2009   TEE WITHOUT CARDIOVERSION N/A 06/18/2023   Procedure: TRANSESOPHAGEAL ECHOCARDIOGRAM;  Surgeon: Wendall Stade, MD;  Location: [facility];  Service: Cardiovascular;  Laterality: N/A;   Patient Active Problem List   Diagnosis Date Noted   Malnutrition of moderate degree 06/19/2023   Staphylococcus aureus infection 06/19/2023   Bacteremia with signs of infection 06/18/2023   Discitis 06/15/2023   Right lateral epicondylitis 04/12/2023   Plantar fasciitis of left foot 08/17/2022   Ascending aorta dilatation -- found on CT Sep 2023 --> Recheck CT in Sep 2024 05/31/2022   Chest wall pain 04/20/2022   Traumatic hematoma of lower leg, left, initial encounter 11/03/2021   Basal cell carcinoma of back 07/22/2021   Dermatofibroma 07/22/2021   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   History of malignant neoplasm of skin 07/22/2021   Lentigo 07/22/2021   Melanocytic nevi of trunk 07/22/2021   Neoplasm of uncertain behavior of skin 07/22/2021   Other skin changes due to chronic exposure to nonionizing radiation 07/22/2021   Skin tag 07/22/2021   Low back pain 08/03/2020   Nonallopathic lesion of sacral region 08/03/2020   Nonallopathic lesion of thoracic region 08/03/2020   Nonallopathic lesion of lumbar region 08/03/2020    Class 1 obesity due to excess calories without serious comorbidity with body mass index (BMI) of 33.0 to 33.9 in adult 11/17/2017   Hypercholesterolemia, not on statin 08/18/2017   H/O abnormal cervical Papanicolaou smear 01/06/2013    PCP: Jarold Motto, PA  REFERRING PROVIDER: Jarold Motto, PA  REFERRING DIAG: [phone] (ICD-10-CM) - Discitis of lumbosacral region   THERAPY DIAG:  Elevated LFTs - Plan: Comp Met (CMET)  Discitis of lumbosacral region - Plan: Ambulatory referral to Physical Therapy  Rationale for Evaluation and Treatment: Rehabilitation  ONSET DATE: early sept  SUBJECTIVE:   SUBJECTIVE STATEMENT: *** Fall while hiking  Low back pain Patient recently had a fall and on x-ray there is a abnormality noted of the sacrum.  Patient also has a very mild narrowing of the right hip that is concerning as well.  The radicular symptoms is fairly significant and patient has been very tender on exam.  Toradol and Depo-Medrol given today.  Prednisone given but I am concerned the patient does have a true herniated disc and has not been treated as aggressively with her traveling internationally recently.  Would like advanced imaging at this point to further evaluate the sacrum for the possible fracture as well as for herniated disc and nerve impingement.  Patient knows that worsening symptoms to seek medical attention immediately.  PERTINENT HISTORY: *** PAIN:  Are you having pain? Yes: NPRS scale: ***/10 Pain location: *** Pain description: *** Aggravating factors: *** Relieving factors: ***  PRECAUTIONS: {Therapy precautions:24002}  RED  FLAGS: {PT Red Flags:29287}   WEIGHT BEARING RESTRICTIONS: {Yes ***/No:24003}  FALLS:  Has patient fallen in last 6 months? {fallsyesno:27318}  LIVING ENVIRONMENT: Lives with: {OPRC lives with:25569::"lives with their family"} Lives in: {Lives in:25570} Stairs: {opstairs:27293} Has following equipment at home: {Assistive  devices:23999}  OCCUPATION: ***  PLOF: {PLOF:24004}  PATIENT GOALS: ***  NEXT MD VISIT: ***  OBJECTIVE:  Note: Objective measures were completed at Evaluation unless otherwise noted.  DIAGNOSTIC FINDINGS:  MRI 06/15/23  Lumbar IMPRESSION: 1. Positive for Discitis Osteomyelitis at L5-S1. Pronounced epidural inflammation there, and small sacral epidural abscess tracking within the right posterior and lateral sacral epidural space from S1 through at least S3, involving the exiting right sacral nerve roots. Subsequent mild to moderate L5-S1 spinal stenosis, in part due to dural thickening and enhancement. And bilateral L5 neural foraminal stenosis and foraminal inflammation.   2. No paraspinal muscle abscess.  No other lumbar levels involved  PATIENT SURVEYS:  {rehab surveys:24030}  COGNITION: Overall cognitive status: {cognition:24006}     SENSATION: {sensation:27233}  EDEMA:  {edema:24020}  MUSCLE LENGTH: Hamstrings: Right *** deg; Left *** deg Maisie Fus test: Right *** deg; Left *** deg  POSTURE: {posture:25561}  PALPATION: ***    LE Measurements Lower Extremity Right EVAL Left EVAL   A/PROM MMT A/PROM MMT  Hip Flexion      Hip Extension      Hip Abduction      Hip Adduction      Hip Internal rotation      Hip External rotation      Knee Flexion      Knee Extension      Ankle Dorsiflexion      Ankle Plantarflexion      Ankle Inversion      Ankle Eversion       (Blank rows = not tested) * pain   LOWER EXTREMITY SPECIAL TESTS:  {LEspecialtests:26242}  FUNCTIONAL TESTS:  {Functional tests:24029}  GAIT: Distance walked: *** Assistive device utilized: {Assistive devices:23999} Level of assistance: {Levels of assistance:24026} Comments: ***   TODAY'S TREATMENT:                                                                                                                              DATE: ***  06/27/2023  Therapeutic  Exercise:  Aerobic: Supine: Prone:  Seated:  Standing: Neuromuscular Re-education: Manual Therapy: Therapeutic Activity: Self Care: Trigger Point Dry Needling:  Modalities:    PATIENT EDUCATION:  Education details: on current presentation, on HEP, on clinical outcomes score and POC Person educated: Patient Education method: Explanation, Demonstration, and Handouts Education comprehension: verbalized understanding   HOME EXERCISE PROGRAM: ***  ASSESSMENT:  CLINICAL IMPRESSION: Patient is a *** y.o. *** who was seen today for physical therapy evaluation and treatment for ***.   OBJECTIVE IMPAIRMENTS: {opptimpairments:25111}.   ACTIVITY LIMITATIONS: {activitylimitations:27494}  PARTICIPATION LIMITATIONS: {participationrestrictions:25113}  PERSONAL FACTORS: {Personal factors:25162} are also affecting patient's functional outcome.   REHAB POTENTIAL: {rehabpotential:25112}  CLINICAL DECISION MAKING: {clinical decision making:25114}  EVALUATION COMPLEXITY: {Evaluation complexity:25115}   GOALS: Goals reviewed with patient? yes  SHORT TERM GOALS: Target date: {follow up:25551} *** MAKE TEXT EDITABLE Patient will be independent in self management strategies to improve quality of life and functional outcomes. Baseline: New Program Goal status: INITIAL  2.  Patient will report at least 50% improvement in overall symptoms and/or function to demonstrate improved functional mobility Baseline: 0% better Goal status: INITIAL  3.  *** Baseline:  Goal status: INITIAL  4.  *** Baseline:  Goal status: INITIAL    LONG TERM GOALS: Target date: {follow up:25551} *** MAKE TEXT EDITABLE  Patient will report at least 75% improvement in overall symptoms and/or function to demonstrate improved functional mobility Baseline: 0% better Goal status: INITIAL  2.  Patient will improve score on FOTO outcomes measure to projected score to demonstrate overall improved function and  QOL Baseline: see above Goal status: INITIAL  3.  *** Baseline:  Goal status: INITIAL  4.  *** Baseline:  Goal status: INITIAL    PLAN:  PT FREQUENCY: {rehab frequency:25116}  PT DURATION: {rehab duration:25117}  PLANNED INTERVENTIONS: Therapeutic exercises, Therapeutic activity, Neuromuscular re-education, Balance training, Gait training, Patient/Family education, Self Care, Joint mobilization, Joint manipulation, Stair training, Vestibular training, Canalith repositioning, Orthotic/Fit training, Prosthetic training, DME instructions, Aquatic Therapy, Dry Needling, Electrical stimulation, Spinal manipulation, Spinal mobilization, Cryotherapy, Moist heat, Taping, Traction, Ultrasound, Ionotophoresis 4mg /ml Dexamethasone, Manual therapy, and Re-evaluation.   PLAN FOR NEXT SESSION: ***   2:12 PM, 06/27/23 Tereasa Coop, DPT Physical Therapy with [facility]

## 2023-06-27 NOTE — Patient Instructions (Signed)
It was great to see you!  For your pain: --Gabapentin 300-600 mg in AM, 300-600 mg in noon, 300-600 mg in PM --Diclofenac 75 mg twice daily -- THIS WILL REPLACE ALL IBUPROFEN --Oxy 10 mg two to three times daily until we can get you in with sports medicine  Stop robaxin, aleve, ibuprofen  Please start back up on the protonix -- this will help keep your stomach lining protected while you are taking diclofenac   Continue to HOLD trazodone and statin until I get your liver labs back!  Recheck blood work today  See Ian Malkin 07/03/23 at 12:45p -- this will be scheduled soon on your mychart --will defer to them about physical therapy  Take care,  Jarold Motto PA-C

## 2023-06-28 ENCOUNTER — Telehealth: Payer: Self-pay | Admitting: *Deleted

## 2023-06-28 ENCOUNTER — Ambulatory Visit (INDEPENDENT_AMBULATORY_CARE_PROVIDER_SITE_OTHER): Payer: BC Managed Care – PPO | Admitting: Physical Therapy

## 2023-06-28 ENCOUNTER — Encounter: Payer: Self-pay | Admitting: Physical Therapy

## 2023-06-28 ENCOUNTER — Encounter: Payer: Self-pay | Admitting: Physician Assistant

## 2023-06-28 DIAGNOSIS — M5459 Other low back pain: Secondary | ICD-10-CM | POA: Diagnosis not present

## 2023-06-28 DIAGNOSIS — R262 Difficulty in walking, not elsewhere classified: Secondary | ICD-10-CM | POA: Diagnosis not present

## 2023-06-28 DIAGNOSIS — M4640 Discitis, unspecified, site unspecified: Secondary | ICD-10-CM | POA: Diagnosis not present

## 2023-06-28 DIAGNOSIS — M6281 Muscle weakness (generalized): Secondary | ICD-10-CM

## 2023-06-28 NOTE — Telephone Encounter (Signed)
Susan [name], received call from [place] with patients insurance telling me pt could not pick up Oxycodone Rx due to pharmacy did not have in stock. Please send something else for pt to take.   FYI, I sent PA Team message now that PA needed for Gabapentin due to plan limit exceeded.

## 2023-06-28 NOTE — Telephone Encounter (Signed)
PA Team need to contact [facility] at [phone]. Thanks

## 2023-06-28 NOTE — Telephone Encounter (Signed)
Received call from Amil Amen from patients insurance stating she is calling about problems with patients Rx's that were sent to the pharmacy yesterday. Told her I was aware of the Gabapentin, received message from pharmacy saying Plan limit exceeded alternative requested and I have sent that to the provider. Amil Amen said that PA needs to be done in order for patient to get her Gabapentin. Told her okay. Amil Amen then proceeded to tell me that the pt could not get her pain medication Oxycodone due to pharmacy did not have it. Told her I will have to send Lelon Mast a message to see if it can be changed to something else for pt. Amil Amen verbalized understanding.

## 2023-06-28 NOTE — Therapy (Signed)
OUTPATIENT PHYSICAL THERAPY THORACOLUMBAR EVALUATION   Patient Name: Susan [name] MRN: [id number] DOB:July 15, 1976, 47 y.o., female Today's Date: 06/28/2023  END OF SESSION:  PT End of Session - 06/28/23 1601     Visit Number 1    Authorization Type [employer] 40 VL    Authorization - Visit Number 1    Authorization - Number of Visits 40    Progress Note Due on Visit 10    PT Start Time 1603    PT Stop Time 1640    PT Time Calculation (min) 37 min             Past Medical History:  Diagnosis Date   Abnormal pap 02/2003   CIN 2-3   Basal cell carcinoma of back 07/22/2021   GERD (gastroesophageal reflux disease)    Hyperlipidemia    Migraine with aura    Past Surgical History:  Procedure Laterality Date   BREAST BIOPSY Left 08/04/2022   [place] LT BREAST BX W LOC DEV 1ST LESION IMG BX SPEC US GUIDE 08/04/2022 [facility]   CERVICAL BIOPSY  W/ LOOP ELECTRODE EXCISION  02/2003   CIN 2-3   CHOLECYSTECTOMY  2015   COSMETIC SURGERY  2009   TEE WITHOUT CARDIOVERSION N/A 06/18/2023   Procedure: TRANSESOPHAGEAL ECHOCARDIOGRAM;  Surgeon: Wendall Stade, MD;  Location: [facility];  Service: Cardiovascular;  Laterality: N/A;   Patient Active Problem List   Diagnosis Date Noted   Malnutrition of moderate degree 06/19/2023   Staphylococcus aureus infection 06/19/2023   Bacteremia with signs of infection 06/18/2023   Discitis 06/15/2023   Right lateral epicondylitis 04/12/2023   Plantar fasciitis of left foot 08/17/2022   Ascending aorta dilatation -- found on CT Sep 2023 --> Recheck CT in Sep 2024 05/31/2022   Chest wall pain 04/20/2022   Traumatic hematoma of lower leg, left, initial encounter 11/03/2021   Basal cell carcinoma of back 07/22/2021   Dermatofibroma 07/22/2021   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   History of malignant neoplasm of skin 07/22/2021   Lentigo 07/22/2021   Melanocytic nevi of trunk 07/22/2021   Neoplasm of uncertain behavior of skin  07/22/2021   Other skin changes due to chronic exposure to nonionizing radiation 07/22/2021   Skin tag 07/22/2021   Low back pain 08/03/2020   Nonallopathic lesion of sacral region 08/03/2020   Nonallopathic lesion of thoracic region 08/03/2020   Nonallopathic lesion of lumbar region 08/03/2020   Class 1 obesity due to excess calories without serious comorbidity with body mass index (BMI) of 33.0 to 33.9 in adult 11/17/2017   Hypercholesterolemia, not on statin 08/18/2017   H/O abnormal cervical Papanicolaou smear 01/06/2013    PCP: Jarold Motto, PA   REFERRING PROVIDER: Jarold Motto, PA   REFERRING DIAG: [phone] (ICD-10-CM) - Discitis of lumbosacral region   Rationale for Evaluation and Treatment: Rehabilitation  THERAPY DIAG:  Other low back pain  Muscle weakness (generalized)  Difficulty in walking, not elsewhere classified  ONSET DATE: 05/29/23 fall  SUBJECTIVE:  SUBJECTIVE STATEMENT: Patient presents with husband and is standing during evaluation secondary to high levels of pain and concern.  Patient unsure if she can even lay down on table for fear she would not be able to get back up due to pain  Patient for started having pain on 9 2 of this year and her left hip where she saw a doctor who gave her some medications.  She then went abroad to [place] where she fell during a hike and went to a physician overseas.  He gave her an injection of medications.  When she came back she saw another doctor as she was having more pain and MRI demonstrated osteomyelitis at L5-S1. She was instructed to go to the ER and was subsequently admitted to hospital from 06/15/23 - 06/20/23 for discitis, bacteremia, staphylococcus aureus infection. Given IV antibiotics. Repeat blood cultures negative.  Patient  was taking gabapentin, naproxen, ibuprofen and oxy but has run out of [place] and her GABA prescription is almost up currently she is in severe pain and concerned for her quality of life once her GABA prescription runs up.  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  She is currently having severe pain with all activities movements and can barely get comfortable side-lying is most comfortable but she tosses and turns she has not been on her stomach.  It feels like she gets stuck after sitting for a while and cannot get upright and everything just locks up on her.  She has never had pain like this before.  Patient also reports that her middle finger was also possibly injured on the right hand during a transfer at the hospital it is swollen and painful.  Her right knee is also bothering her and she has noticed numbness in her right great toe it used to be in her heel but now is primarily just in her great toe.  PERTINENT HISTORY:  Migraines, GERD  PAIN:  Are you having pain? Yes: NPRS scale: 50/10 Pain location: Low back Pain description: Sharp achy throbbing unbearable Aggravating factors: Movement sitting transitional movements Relieving factors: Medication  PRECAUTIONS: Fall  RED FLAGS: None   WEIGHT BEARING RESTRICTIONS: No  FALLS:  Has patient fallen in last 6 months? Yes. 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  DIAGNOSTIC FINDINGS:  Lumbar MRI 06/15/2023    IMPRESSION: 1. Positive for Discitis Osteomyelitis at L5-S1. Pronounced  epidural inflammation there, and small sacral epidural abscess tracking within the right posterior and lateral sacral epidural space from S1 through at least S3, involving the exiting right sacral nerve roots. Subsequent mild to moderate L5-S1 spinal stenosis, in part due to dural thickening and enhancement. And bilateral L5 neural foraminal stenosis and foraminal inflammation.   2. No paraspinal muscle abscess.  No other lumbar levels involved.    PATIENT SURVEYS:  FOTO 9%  SCREENING FOR RED FLAGS: Bowel or bladder incontinence: No Spinal tumors: No Cauda equina syndrome: No Compression fracture: No Abdominal aneurysm: No  COGNITION: Overall cognitive status: Within functional limits  for tasks assessed     SENSATION: Not tested    POSTURE: Slumped to forward posture with heavy use hands on rolling walker  PALPATION: No tenderness to palpation noted in lumbar musculature or glutes  LUMBAR ROM: Unable to formally test secondary to high levels of pain  AROM   Flexion   Extension   Right lateral flexion   Left lateral flexion   Right rotation   Left rotation    (Blank rows = not tested)    LE Measurements unable to formally test secondary to high levels of pain Lower Extremity Right  Left    A/PROM MMT A/PROM MMT  Hip Flexion      Hip Extension      Hip Abduction      Hip Adduction      Hip Internal rotation      Hip External rotation      Knee Flexion      Knee Extension      Ankle Dorsiflexion      Ankle Plantarflexion      Ankle Inversion      Ankle Eversion       (Blank rows = not tested) * pain'   LUMBAR SPECIAL TESTS:  Unable to test secondary to high levels of pain FUNCTIONAL TESTS:  Sit to stand: Slow labored movements patient holding breath with increased pain strong use of arms. Unable to perform any bed mobilities today secondary to pain, fear of pain and fear of not being able to get up Stand to sit 3 attempts with elevated height of  chair and strong use of her arms very painful slow-labored movements  GAIT: Distance walked: 25 feet in clinic Assistive device utilized: Walker - 2 wheeled Level of assistance: SBA Comments: Labored gait with a rolling walker, excessive weight on upper extremities, wide base of support, antalgic  TODAY'S TREATMENT:                                                                                                                              DATE:   06/28/2023  Therapeutic Exercise:  Aerobic: Supine: Prone:  Seated: Long exhale breathing to help with pain  Standing: Neuromuscular Re-education: Manual Therapy: Therapeutic Activity: Self Care:see education below  Trigger Point Dry Needling:  Modalities:    PATIENT EDUCATION:  Education details: on current presentation, on HEP, on clinical outcomes score and POC, educated patient on positional relief, and use of SIJ belt or lumbar support to help reduce pain and back, on effects of increased intra-abdominal pressure and core and how it relates to pain.  Discussed exhaling with movement and practicing slow exhales to help with pain management, and use of compression shorts to help with swelling and support, and use of ice for pain Person educated: Patient Education method: Explanation, Demonstration, and Handouts Education comprehension: verbalized understanding   HOME EXERCISE PROGRAM: No [facility] at this time  ASSESSMENT:  CLINICAL IMPRESSION: Patient presents to  physical therapy with complaints of severe low back pain after a fall resulting in osteomyelitis of L5-S1.  Patient has been treated with a course of antibiotics in the hospital and blood work has come back negative.  She presents with use of rolling walker and severe pain with all movements and transitional movements.  Session limited on objective information today secondary to high levels of pain.  Primary goal of today's session was to reassure patient and her family  and give pain management strategies to help cope with the current pain levels.  Patient is greatly limited in function due to pain in range of motion and strength deficits and would greatly benefit from skilled PT at this time.  OBJECTIVE IMPAIRMENTS: Abnormal gait, decreased activity tolerance, decreased balance, decreased coordination, decreased endurance, decreased knowledge of use of DME, decreased mobility, difficulty walking, decreased ROM, decreased strength, improper body mechanics, postural dysfunction, and pain.   ACTIVITY LIMITATIONS: carrying, lifting, bending, sitting, standing, squatting, sleeping, stairs, transfers, bed mobility, bathing, toileting, dressing, hygiene/grooming, and locomotion level  PARTICIPATION LIMITATIONS: meal prep, cleaning, laundry, driving, shopping, community activity, and occupation  PERSONAL FACTORS: Age, Fitness, and 1 comorbidity: Osteomyelitis L5-S1  are also affecting patient's functional outcome.   REHAB POTENTIAL: Good  CLINICAL DECISION MAKING: Evolving/moderate complexity  EVALUATION COMPLEXITY: Moderate   GOALS: Goals reviewed with patient? yes  SHORT TERM GOALS: Target date: 08/10/2023 Patient will be independent in self management strategies to improve quality of life and functional outcomes. Baseline: New Program Goal status: INITIAL  2.  Patient will report at least 25 % improvement in overall symptoms and/or function to demonstrate improved functional mobility Baseline: 0% better Goal status: INITIAL  3.  Patient will be able to transition from sit to stand and stand to sit from a normal height chair without the use of her arms Baseline: Unable and very painful Goal status: INITIAL  4.  Patient will be able to walk for 15 minutes with use of assistive device as needed without severe pain to improve ambulatory mobility in the home Baseline: Requires walker 5 to 10 minutes max with severe pain afterwards Goal status:  INITIAL    LONG TERM GOALS: Target date: 09/20/2023 Patient will report at least 50 % improvement in overall symptoms and/or function to demonstrate improved functional mobility Baseline: 0% better Goal status: INITIAL  2.  Patient will improve score on FOTO outcomes measure to projected score to demonstrate overall improved function and QOL Baseline: see above Goal status: INITIAL  3.  Patient will be able to walk up to 30 minutes without the use of a rolling walker and without severe pain to improve overall function and return to prior level of function Baseline: 5 to 10 minutes with rolling walker painful Goal status: INITIAL  4.  Patient will be able to sit for at least 8 hours with intermittent standing without severe pain at the end of the dayto be able to perform job tasks Baseline: Currently 5 hours but severe pain afterwards Goal status: INITIAL   PLAN:  PT FREQUENCY: 2x/week  PT DURATION: 12 weeks  PLANNED INTERVENTIONS: Therapeutic exercises, Therapeutic activity, Neuromuscular re-education, Balance training, Gait training, Patient/Family education, Self Care, Joint mobilization, Joint manipulation, Stair training, Vestibular training, Canalith repositioning, Orthotic/Fit training, Prosthetic training, DME instructions, Aquatic Therapy, Dry Needling, Electrical stimulation, Spinal manipulation, Spinal mobilization, Cryotherapy, Moist heat, Taping, Traction, Ultrasound, Ionotophoresis 4mg /ml Dexamethasone, Manual therapy, and Re-evaluation.   PLAN FOR NEXT SESSION: Pain management strategies, follow-up with compression, follow-up with  heat and use of SIJ belt, breathing strategies and positional strategies once tolerated, trial prone, trial gentle traction   7:55 AM, 06/29/23 Tereasa Coop, DPT Physical Therapy with [facility]

## 2023-06-28 NOTE — Telephone Encounter (Signed)
Please see message from pharmacy.

## 2023-06-28 NOTE — Telephone Encounter (Signed)
Please do PA for Gabapentin due to Plan exceed limit. Please send office note from 06/27/2023. See message.

## 2023-06-29 ENCOUNTER — Other Ambulatory Visit (HOSPITAL_BASED_OUTPATIENT_CLINIC_OR_DEPARTMENT_OTHER): Payer: Self-pay

## 2023-06-29 ENCOUNTER — Telehealth: Payer: Self-pay

## 2023-06-29 ENCOUNTER — Encounter: Payer: Self-pay | Admitting: Physician Assistant

## 2023-06-29 ENCOUNTER — Other Ambulatory Visit (HOSPITAL_COMMUNITY): Payer: Self-pay

## 2023-06-29 ENCOUNTER — Other Ambulatory Visit: Payer: Self-pay | Admitting: Physician Assistant

## 2023-06-29 DIAGNOSIS — M4640 Discitis, unspecified, site unspecified: Secondary | ICD-10-CM | POA: Diagnosis not present

## 2023-06-29 MED ORDER — OXYCODONE HCL 10 MG PO TABS
10.0000 mg | ORAL_TABLET | Freq: Three times a day (TID) | ORAL | 0 refills | Status: AC | PRN
  Filled 2023-06-29: qty 42, 14d supply, fill #0

## 2023-06-29 MED ORDER — GABAPENTIN 300 MG PO CAPS
300.0000 mg | ORAL_CAPSULE | Freq: Three times a day (TID) | ORAL | 1 refills | Status: DC
Start: 2023-06-29 — End: 2023-07-30
  Filled 2023-06-29: qty 90, 15d supply, fill #0
  Filled 2023-07-13: qty 90, 15d supply, fill #1

## 2023-06-29 NOTE — Telephone Encounter (Signed)
Patient has picked up both prescriptions and no PA was needed.

## 2023-06-29 NOTE — Telephone Encounter (Signed)
Pharmacy Patient Advocate Encounter  Received notification from [employer] that Prior Authorization for Gabapentin 300mg  caps has been APPROVED from 05/30/23 to 06/28/24   PA #/Case ID/Reference #: [id number]  Left a voicemail at [facility] to notify of the approval.

## 2023-06-29 NOTE — Telephone Encounter (Signed)
Contacted [facility] and they have medication in stock and working on this for the patient. Advised patient and she verbalized understanding.

## 2023-06-29 NOTE — Telephone Encounter (Signed)
Pharmacy Patient Advocate Encounter   Received notification from RX Request Messages that prior authorization for Gabapentin 300mg   is required/requested.   Insurance verification completed.   The patient is insured through [employer] .   Per test claim: PA required; PA submitted to [employer] via CoverMyMeds Key/confirmation #/EOC [id number] Status is pending

## 2023-06-29 NOTE — Telephone Encounter (Signed)
Called [facility] and working on filling prescription for patient this morning. Called patient and advised, pt verbalized understanding

## 2023-06-30 DIAGNOSIS — M4640 Discitis, unspecified, site unspecified: Secondary | ICD-10-CM | POA: Diagnosis not present

## 2023-07-01 DIAGNOSIS — M4640 Discitis, unspecified, site unspecified: Secondary | ICD-10-CM | POA: Diagnosis not present

## 2023-07-02 ENCOUNTER — Ambulatory Visit (INDEPENDENT_AMBULATORY_CARE_PROVIDER_SITE_OTHER): Payer: BC Managed Care – PPO | Admitting: Physical Therapy

## 2023-07-02 ENCOUNTER — Encounter: Payer: Self-pay | Admitting: Physical Therapy

## 2023-07-02 DIAGNOSIS — M6281 Muscle weakness (generalized): Secondary | ICD-10-CM | POA: Diagnosis not present

## 2023-07-02 DIAGNOSIS — M5459 Other low back pain: Secondary | ICD-10-CM

## 2023-07-02 DIAGNOSIS — R262 Difficulty in walking, not elsewhere classified: Secondary | ICD-10-CM

## 2023-07-02 DIAGNOSIS — M4640 Discitis, unspecified, site unspecified: Secondary | ICD-10-CM | POA: Diagnosis not present

## 2023-07-02 NOTE — Therapy (Signed)
OUTPATIENT PHYSICAL THERAPY THORACOLUMBAR TREATMENT   Patient Name: Susan [name] MRN: [id number] DOB:19-Jun-1976, 47 y.o., female Today's Date: 07/02/2023  END OF SESSION:  PT End of Session - 07/02/23 1533     Visit Number 2    Number of Visits 24    Date for PT Re-Evaluation 09/20/23    Authorization Type [employer] 40 VL    Authorization - Visit Number 2    Authorization - Number of Visits 40    Progress Note Due on Visit 10    PT Start Time 1432    PT Stop Time 1518    PT Time Calculation (min) 46 min    Activity Tolerance Patient limited by pain    Behavior During Therapy Restless;Anxious              Past Medical History:  Diagnosis Date   Abnormal pap 02/2003   CIN 2-3   Basal cell carcinoma of back 07/22/2021   GERD (gastroesophageal reflux disease)    Hyperlipidemia    Migraine with aura    Past Surgical History:  Procedure Laterality Date   BREAST BIOPSY Left 08/04/2022   [place] LT BREAST BX W LOC DEV 1ST LESION IMG BX SPEC US GUIDE 08/04/2022 [facility]   CERVICAL BIOPSY  W/ LOOP ELECTRODE EXCISION  02/2003   CIN 2-3   CHOLECYSTECTOMY  2015   COSMETIC SURGERY  2009   TEE WITHOUT CARDIOVERSION N/A 06/18/2023   Procedure: TRANSESOPHAGEAL ECHOCARDIOGRAM;  Surgeon: Wendall Stade, MD;  Location: [facility];  Service: Cardiovascular;  Laterality: N/A;   Patient Active Problem List   Diagnosis Date Noted   Malnutrition of moderate degree 06/19/2023   Staphylococcus aureus infection 06/19/2023   Bacteremia with signs of infection 06/18/2023   Discitis 06/15/2023   Right lateral epicondylitis 04/12/2023   Plantar fasciitis of left foot 08/17/2022   Ascending aorta dilatation -- found on CT Sep 2023 --> Recheck CT in Sep 2024 05/31/2022   Chest wall pain 04/20/2022   Traumatic hematoma of lower leg, left, initial encounter 11/03/2021   Basal cell carcinoma of back 07/22/2021   Dermatofibroma 07/22/2021   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    History of malignant neoplasm of skin 07/22/2021   Lentigo 07/22/2021   Melanocytic nevi of trunk 07/22/2021   Neoplasm of uncertain behavior of skin 07/22/2021   Other skin changes due to chronic exposure to nonionizing radiation 07/22/2021   Skin tag 07/22/2021   Low back pain 08/03/2020   Nonallopathic lesion of sacral region 08/03/2020   Nonallopathic lesion of thoracic region 08/03/2020   Nonallopathic lesion of lumbar region 08/03/2020   Class 1 obesity due to excess calories without serious comorbidity with body mass index (BMI) of 33.0 to 33.9 in adult 11/17/2017   Hypercholesterolemia, not on statin 08/18/2017   H/O abnormal cervical Papanicolaou smear 01/06/2013    PCP: Jarold Motto, PA   REFERRING PROVIDER: Jarold Motto, PA   REFERRING DIAG: [phone] (ICD-10-CM) - Discitis of lumbosacral region   Rationale for Evaluation and Treatment: Rehabilitation  THERAPY DIAG:  Other low back pain  Difficulty in walking, not elsewhere classified  Muscle weakness (generalized)  ONSET DATE: 05/29/23 fall  SUBJECTIVE:  SUBJECTIVE STATEMENT: 07/02/2023 Reports her sciatica is really bad and shooting down the right leg in the AM but once she gets up it is better. States she has been on her pain medication but is due for it now. States that she tried to no use the walker today but thinks that is a bad idea. States she got the belt and it is helping and she is not locking up as much.  Eval: Patient presents with husband and is standing during evaluation secondary to high levels of pain and concern.  Patient unsure if she can even lay down on table for fear she would not be able to get back up due to pain  Patient for started having pain on 9 2 of this year and her left hip where she saw a doctor  who gave her some medications.  She then went abroad to [place] where she fell during a hike and went to a physician overseas.  He gave her an injection of medications.  When she came back she saw another doctor as she was having more pain and MRI demonstrated osteomyelitis at L5-S1. She was instructed to go to the ER and was subsequently admitted to hospital from 06/15/23 - 06/20/23 for discitis, bacteremia, staphylococcus aureus infection. Given IV antibiotics. Repeat blood cultures negative.  Patient was taking gabapentin, naproxen, ibuprofen and oxy but has run out of [place] and her GABA prescription is almost up currently she is in severe pain and concerned for her quality of life once her GABA prescription runs up.  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  She is currently having severe pain with all activities movements and can barely get comfortable side-lying is most comfortable but she tosses and turns she has not been on her stomach.  It feels like she gets stuck after sitting for a while and cannot get upright and everything just locks up on her.  She has never had pain like this before.  Patient also reports that her middle finger was also possibly injured on the right hand during a transfer at the hospital it is swollen and painful.  Her right knee is also bothering her and she has noticed numbness in her right great toe it used to be in her heel but now is primarily just in her great toe.  PERTINENT HISTORY:  Migraines, GERD  PAIN:  Are you having pain? Yes: NPRS scale: 3/10 Pain location: Low back and right leg Pain description: Sharp achy throbbing unbearable Aggravating factors: Movement sitting transitional movements Relieving factors: Medication  PRECAUTIONS: Fall  RED FLAGS: None   WEIGHT BEARING RESTRICTIONS: No  FALLS:  Has patient fallen in last 6 months? Yes. 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  DIAGNOSTIC FINDINGS:  Lumbar MRI 06/15/2023    IMPRESSION: 1. Positive for Discitis Osteomyelitis at L5-S1. Pronounced epidural inflammation there, and small sacral epidural abscess tracking within the right posterior and lateral sacral epidural space from S1 through at least S3, involving the exiting right sacral nerve  roots. Subsequent mild to moderate L5-S1 spinal stenosis, in part due to dural thickening and enhancement. And bilateral L5 neural foraminal stenosis and foraminal inflammation.   2. No paraspinal muscle abscess.  No other lumbar levels involved.    PATIENT SURVEYS:  FOTO 9%  SCREENING FOR RED FLAGS: Bowel or bladder incontinence: No Spinal tumors: No Cauda equina syndrome: No Compression fracture: No Abdominal aneurysm: No  COGNITION: Overall cognitive status: Within functional limits for tasks assessed     SENSATION: Not tested    POSTURE: Slumped to forward posture with heavy use hands on rolling walker  PALPATION: No tenderness to palpation noted in lumbar musculature or glutes  LUMBAR ROM: Unable to formally test secondary to high levels of pain  AROM   Flexion   Extension   Right lateral flexion   Left lateral flexion   Right rotation   Left rotation    (Blank rows = not tested)    LE Measurements unable to formally test secondary to high levels of pain Lower Extremity Right  Left    A/PROM MMT A/PROM MMT  Hip Flexion      Hip Extension      Hip Abduction      Hip Adduction      Hip Internal rotation      Hip External rotation      Knee  Flexion      Knee Extension      Ankle Dorsiflexion      Ankle Plantarflexion      Ankle Inversion      Ankle Eversion       (Blank rows = not tested) * pain'   LUMBAR SPECIAL TESTS:  Unable to test secondary to high levels of pain FUNCTIONAL TESTS:  Sit to stand: Slow labored movements patient holding breath with increased pain strong use of arms. Unable to perform any bed mobilities today secondary to pain, fear of pain and fear of not being able to get up Stand to sit 3 attempts with elevated height of chair and strong use of her arms very painful slow-labored movements  GAIT: Distance walked: 25 feet in clinic Assistive device utilized: Walker - 2 wheeled Level of assistance: SBA Comments: Labored gait with a rolling walker, excessive weight on upper extremities, wide base of support, antalgic  TODAY'S TREATMENT:                                                                                                                              DATE:   07/02/2023  Therapeutic Exercise:  Aerobic: Supine: Supported hook lying with bolster towels and pillows focus on purposeful breathing, passive range of motion and active assisted range of motion of hips into flexion/extension internal, rotation/external rotation and abduction and adduction.  Gentle muscle isometrics of hip adduction.  And with scapular retraction.. Prone:  Seated: Long exhale breathing to help with pain  Standing: Neuromuscular Re-education: Manual Therapy: Therapeutic Activity: Sit to supine  with support and supine to sit transfer with min assist and verbal cues and guidance throughout 15 minutes total Self Care:see education below  Trigger Point Dry Needling:  Modalities:    PATIENT EDUCATION:  Education details: on importance of gentle muscle activation and relaxation, on positional strategies for relief, on strategies for movement and importance of exhaling with movement, and current presentation and  importance of using walker to reduce stress on that continuing to take pain medication as prescribed to stay ahead of her pain Person educated: Patient Education method: Explanation, Demonstration, and Handouts Education comprehension: verbalized understanding   HOME EXERCISE PROGRAM: No [facility] at this time  ASSESSMENT:  CLINICAL IMPRESSION: 07/02/2023 Patient with hesitancy to perform transfer to supine position.  With encouragement patient able to perform but very unsure in this position.  Patient presented with full body fasciculations once in supported hook lying position.  This reduced with proper muscle activation (isometrics) and purposeful breathing.  Tolerated session well but increased concern and hesitancy with transition back to sitting from supine position as patient anticipated pain.  Tolerated mildly well with PT guidance and min assist for transfer.  Utilized in house with walker with walking in and out of clinic secondary to patient needing walker and car.  Overall much improved presentation from previous session but pain continues to be driving force of session and primary concern of patient at this time.  Patient to follow-up with MD tomorrow.  Will follow-up with this visit next session.  Patient will continue to benefit from skilled PT at this time.  Eval: Patient presents to physical therapy with complaints of severe low back pain after a fall resulting in osteomyelitis of L5-S1.  Patient has been treated with a course of antibiotics in the hospital and blood work has come back negative.  She presents with use of rolling walker and severe pain with all movements and transitional movements.  Session limited on objective information today secondary to high levels of pain.  Primary goal of today's session was to reassure patient and her family and give pain management strategies to help cope with the current pain levels.  Patient is greatly limited in function due to pain in  range of motion and strength deficits and would greatly benefit from skilled PT at this time.  OBJECTIVE IMPAIRMENTS: Abnormal gait, decreased activity tolerance, decreased balance, decreased coordination, decreased endurance, decreased knowledge of use of DME, decreased mobility, difficulty walking, decreased ROM, decreased strength, improper body mechanics, postural dysfunction, and pain.   ACTIVITY LIMITATIONS: carrying, lifting, bending, sitting, standing, squatting, sleeping, stairs, transfers, bed mobility, bathing, toileting, dressing, hygiene/grooming, and locomotion level  PARTICIPATION LIMITATIONS: meal prep, cleaning, laundry, driving, shopping, community activity, and occupation  PERSONAL FACTORS: Age, Fitness, and 1 comorbidity: Osteomyelitis L5-S1  are also affecting patient's functional outcome.   REHAB POTENTIAL: Good  CLINICAL DECISION MAKING: Evolving/moderate complexity  EVALUATION COMPLEXITY: Moderate   GOALS: Goals reviewed with patient? yes  SHORT TERM GOALS: Target date: 08/10/2023 Patient will be independent in self management strategies to improve quality of life and functional outcomes. Baseline: New Program Goal status: INITIAL  2.  Patient will report at least 25 % improvement in overall symptoms and/or function to demonstrate improved functional mobility Baseline: 0% better Goal status: INITIAL  3.  Patient will be able to transition from sit to stand and stand to sit from a normal height chair without the use of her arms Baseline: Unable and very painful Goal status: INITIAL  4.  Patient  will be able to walk for 15 minutes with use of assistive device as needed without severe pain to improve ambulatory mobility in the home Baseline: Requires walker 5 to 10 minutes max with severe pain afterwards Goal status: INITIAL    LONG TERM GOALS: Target date: 09/20/2023 Patient will report at least 50 % improvement in overall symptoms and/or function to  demonstrate improved functional mobility Baseline: 0% better Goal status: INITIAL  2.  Patient will improve score on FOTO outcomes measure to projected score to demonstrate overall improved function and QOL Baseline: see above Goal status: INITIAL  3.  Patient will be able to walk up to 30 minutes without the use of a rolling walker and without severe pain to improve overall function and return to prior level of function Baseline: 5 to 10 minutes with rolling walker painful Goal status: INITIAL  4.  Patient will be able to sit for at least 8 hours with intermittent standing without severe pain at the end of the dayto be able to perform job tasks Baseline: Currently 5 hours but severe pain afterwards Goal status: INITIAL   PLAN:  PT FREQUENCY: 2x/week  PT DURATION: 12 weeks  PLANNED INTERVENTIONS: Therapeutic exercises, Therapeutic activity, Neuromuscular re-education, Balance training, Gait training, Patient/Family education, Self Care, Joint mobilization, Joint manipulation, Stair training, Vestibular training, Canalith repositioning, Orthotic/Fit training, Prosthetic training, DME instructions, Aquatic Therapy, Dry Needling, Electrical stimulation, Spinal manipulation, Spinal mobilization, Cryotherapy, Moist heat, Taping, Traction, Ultrasound, Ionotophoresis 4mg /ml Dexamethasone, Manual therapy, and Re-evaluation.   PLAN FOR NEXT SESSION: Pain management strategies, follow-up with compression, follow-up with heat and use of SIJ belt, breathing strategies and positional strategies once tolerated, trial prone, trial gentle traction   3:39 PM, 07/02/23 Tereasa Coop, DPT Physical Therapy with [place]

## 2023-07-03 ENCOUNTER — Other Ambulatory Visit: Payer: Self-pay | Admitting: Physician Assistant

## 2023-07-03 ENCOUNTER — Ambulatory Visit: Payer: BC Managed Care – PPO | Admitting: Family Medicine

## 2023-07-03 VITALS — BP 114/80 | HR 96 | Ht 68.0 in

## 2023-07-03 DIAGNOSIS — M4647 Discitis, unspecified, lumbosacral region: Secondary | ICD-10-CM

## 2023-07-03 DIAGNOSIS — M545 Low back pain, unspecified: Secondary | ICD-10-CM | POA: Diagnosis not present

## 2023-07-03 DIAGNOSIS — M4640 Discitis, unspecified, site unspecified: Secondary | ICD-10-CM | POA: Diagnosis not present

## 2023-07-03 MED ORDER — METHYLPREDNISOLONE ACETATE 80 MG/ML IJ SUSP
80.0000 mg | Freq: Once | INTRAMUSCULAR | Status: AC
Start: 2023-07-03 — End: 2023-07-03
  Administered 2023-07-03: 80 mg via INTRAMUSCULAR

## 2023-07-03 MED ORDER — KETOROLAC TROMETHAMINE 60 MG/2ML IM SOLN
60.0000 mg | Freq: Once | INTRAMUSCULAR | Status: AC
Start: 2023-07-03 — End: 2023-07-03
  Administered 2023-07-03: 60 mg via INTRAMUSCULAR

## 2023-07-03 NOTE — Patient Instructions (Signed)
Cocktail injection today Hold diclofenac until tomorrow Think PT will be perfect See you again in 4-6 weeks

## 2023-07-03 NOTE — Assessment & Plan Note (Addendum)
Patient had knee swelling noted.  None patient is doing better overall.  Still needs to work on some of the range of motion and exercises which I do think formal physical therapy will be helpful.  We discussed with patient about other different medications.  At this point patient did not want to make any significant changes and feels that the pain medication is helpful.  Hopefully with patient starting to increase her activity she will be able to start doing strengthening and other modalities that will help her discontinue this medication in the near future.  We did discuss the possibility of other nerve modulators but patient wanted to hold at the moment.  Follow-up with me again in 2 months.  Continue the IV antibiotics for total of 6 weeks Toradol and Depo-Medrol also given today secondary to patient's pain.

## 2023-07-03 NOTE — Progress Notes (Signed)
[name] [address] [place] [address] Phone: [phone] Subjective:   Susan [name], am serving as a scribe for Dr. Antoine [name].  I'm seeing this patient by the request  of:  Susan [name], [name]  CC: Low back pain follow-up  [id number]  Susan [name] is a 47 y.o. female coming in with complaint of back pain. ED following. Nothing new since ED visit.  Patient did have discitis with noted on the MRI in the center there for I&D antibiotics.  Patient is continuing to take Benadryl on a regular basis.  Feels like she is not having any fevers, chills, any abnormal weight loss and states that the pain though is still fairly severe.  May be making some very slow progress so far.       Past Medical History:  Diagnosis Date   Abnormal pap 02/2003   CIN 2-3   Basal cell carcinoma of back 07/22/2021   GERD (gastroesophageal reflux disease)    Hyperlipidemia    Migraine with aura    Past Surgical History:  Procedure Laterality Date   BREAST BIOPSY Left 08/04/2022   [place] LT BREAST BX W LOC DEV 1ST LESION IMG BX SPEC US GUIDE 08/04/2022 [facility]   CERVICAL BIOPSY  W/ LOOP ELECTRODE EXCISION  02/2003   CIN 2-3   CHOLECYSTECTOMY  2015   COSMETIC SURGERY  2009   TEE WITHOUT CARDIOVERSION N/A 06/18/2023   Procedure: TRANSESOPHAGEAL ECHOCARDIOGRAM;  Surgeon: Wendall Stade, MD;  Location: [facility];  Service: Cardiovascular;  Laterality: N/A;   Social History   Socioeconomic History   Marital status: Married    Spouse name: Not on file   Number of children: 0   Years of education: Not on file   Highest education level: Not on file  Occupational History    Employer: [employer]  Tobacco Use   Smoking status: Never   Smokeless tobacco: Never  Vaping Use   Vaping status: Never Used  Substance and Sexual Activity   Alcohol use: Yes    Alcohol/week: 2.0 standard drinks of alcohol    Types: 2 Glasses of wine per  week    Comment: 1-2 glasses/week   Drug use: No   Sexual activity: Yes    Partners: Male    Birth control/protection: Condom  Other Topics Concern   Not on file  Social History Narrative   Married.    No children.    Social Determinants of Health   Financial Resource Strain: Not on file  Food Insecurity: No Food Insecurity (06/15/2023)   Hunger Vital Sign    Worried About Running Out of Food in the Last Year: Never true    Ran Out of Food in the Last Year: Never true  Transportation Needs: No Transportation Needs (06/15/2023)   PRAPARE - Administrator, Civil Service (Medical): No    Lack of Transportation (Non-Medical): No  Physical Activity: Not on file  Stress: Not on file  Social Connections: Not on file   No Known Allergies Family History  Problem Relation Age of Onset   HIV Mother    Other Mother    Early death Mother        Age 41   Hypertension Father    Depression Father        Suicide   Early death Brother        Age 29   Drug abuse Brother  Hypertension Maternal Grandmother    Diabetes Maternal Grandmother    Stroke Maternal Grandmother    Heart failure Maternal Grandfather    Heart disease Maternal Grandfather    Diabetes Paternal Grandmother    Depression Paternal Grandfather        Suicide   Colon cancer Neg Hx    Colon polyps Neg Hx    Esophageal cancer Neg Hx    Rectal cancer Neg Hx    Stomach cancer Neg Hx        Current Outpatient Medications (Analgesics):    diclofenac (VOLTAREN) 75 MG EC tablet, Take 1 tablet (75 mg total) by mouth 2 (two) times daily.   Oxycodone HCl 10 MG TABS, Take 1 tablet (10 mg total) by mouth 3 (three) times daily as needed for up to 14 days (severe pain).   Current Outpatient Medications (Other):    ceFAZolin (ANCEF) IVPB, Inject 2 g into the vein every 8 (eight) hours. Indication:   MSSA discitis/epidural abscess First Dose: Yes Last Day of Therapy:  08/13/23 Labs - Once weekly:  CBC/D and BMP,  Labs - Once weekly: ESR and CRP Method of administration: IV Push Method of administration may be changed at the discretion of home infusion pharmacist based upon assessment of the patient and/or caregiver's ability to self-administer the medication ordered.   gabapentin (NEURONTIN) 300 MG capsule, Take 1-2 capsules (300-600 mg total) by mouth 3 (three) times daily.   pantoprazole (PROTONIX) 40 MG tablet, TAKE 1 TABLET BY MOUTH EVERY DAY (Patient not taking: Reported on 06/27/2023)   traZODone (DESYREL) 100 MG tablet, TAKE 1 TABLET BY MOUTH EVERYDAY AT BEDTIME (Patient not taking: Reported on 06/27/2023)   Reviewed prior external information including notes and imaging from  primary care provider As well as notes that were available from care everywhere and other healthcare systems.  Past medical history, social, surgical and family history all reviewed in electronic medical record.  No pertanent information unless stated regarding to the chief complaint.   Review of Systems:  No headache, visual changes, nausea, vomiting, diarrhea, constipation, dizziness, abdominal pain, skin rash, fevers, chills, night sweats, weight loss, swollen lymph nodes,  joint swelling, chest pain, shortness of breath, mood changes. POSITIVE muscle aches, body aches  Objective  Blood pressure 114/80, pulse 96, height 5\' 8"  (1.727 m), last menstrual period 05/20/2022, SpO2 98%.   General: No apparent distress alert and oriented x3 mood and affect normal, dressed appropriately.  HEENT: Pupils equal, extraocular movements intact  Respiratory: Patient's speak in full sentences and does not appear short of breath  Cardiovascular: No lower extremity edema, non tender, no erythema  Low back does have some loss lordosis noted.  Patient does ambulate with the aid of a walker.  Seems to do relatively well but does seem uncomfortable even with sitting.  Neurovascularly intact distally.    Impression and Recommendations:      The above documentation has been reviewed and is accurate and complete Judi Saa, DO

## 2023-07-04 DIAGNOSIS — M4640 Discitis, unspecified, site unspecified: Secondary | ICD-10-CM | POA: Diagnosis not present

## 2023-07-04 DIAGNOSIS — M4626 Osteomyelitis of vertebra, lumbar region: Secondary | ICD-10-CM | POA: Diagnosis not present

## 2023-07-05 ENCOUNTER — Encounter (HOSPITAL_BASED_OUTPATIENT_CLINIC_OR_DEPARTMENT_OTHER): Payer: Self-pay

## 2023-07-05 ENCOUNTER — Other Ambulatory Visit: Payer: Self-pay

## 2023-07-05 ENCOUNTER — Ambulatory Visit: Payer: BC Managed Care – PPO | Admitting: Physical Therapy

## 2023-07-05 ENCOUNTER — Emergency Department (HOSPITAL_BASED_OUTPATIENT_CLINIC_OR_DEPARTMENT_OTHER)
Admission: EM | Admit: 2023-07-05 | Discharge: 2023-07-05 | Discharge status: Against Medical Advice | Payer: BC Managed Care – PPO | Attending: Emergency Medicine | Admitting: Emergency Medicine

## 2023-07-05 ENCOUNTER — Encounter: Payer: Self-pay | Admitting: Physical Therapy

## 2023-07-05 DIAGNOSIS — Z5321 Procedure and treatment not carried out due to patient leaving prior to being seen by health care provider: Secondary | ICD-10-CM | POA: Diagnosis not present

## 2023-07-05 DIAGNOSIS — M5459 Other low back pain: Secondary | ICD-10-CM

## 2023-07-05 DIAGNOSIS — R32 Unspecified urinary incontinence: Secondary | ICD-10-CM | POA: Insufficient documentation

## 2023-07-05 DIAGNOSIS — M6281 Muscle weakness (generalized): Secondary | ICD-10-CM | POA: Diagnosis not present

## 2023-07-05 DIAGNOSIS — R262 Difficulty in walking, not elsewhere classified: Secondary | ICD-10-CM

## 2023-07-05 DIAGNOSIS — M4640 Discitis, unspecified, site unspecified: Secondary | ICD-10-CM | POA: Diagnosis not present

## 2023-07-05 NOTE — ED Notes (Signed)
Patient called several times with no response.

## 2023-07-05 NOTE — ED Triage Notes (Signed)
Pt POV from home reporting urinary incontinence yesterday and today. Dx staph infection in back 9/27 decreased mobility in PT because of same, seen today and told to come to ED for evaluation. Denies other urinary symptoms

## 2023-07-05 NOTE — Therapy (Signed)
OUTPATIENT PHYSICAL THERAPY THORACOLUMBAR TREATMENT   Patient Name: Susan [name] MRN: [id number] DOB:May 26, 1976, 47 y.o., female Today's Date: 07/05/2023  END OF SESSION:  PT End of Session - 07/05/23 1216     Visit Number 3    Number of Visits 24    Date for PT Re-Evaluation 09/20/23    Authorization Type [employer] [id number] VL    Authorization - Visit Number 3    Authorization - Number of Visits 40    Progress Note Due on Visit 10    PT Start Time 1216    PT Stop Time 1233    PT Time Calculation (min) 17 min    Activity Tolerance Patient limited by pain    Behavior During Therapy Restless;Anxious              Past Medical History:  Diagnosis Date   Abnormal pap 02/2003   CIN 2-3   Basal cell carcinoma of back 07/22/2021   GERD (gastroesophageal reflux disease)    Hyperlipidemia    Migraine with aura    Past Surgical History:  Procedure Laterality Date   BREAST BIOPSY Left 08/04/2022   [place] LT BREAST BX W LOC DEV 1ST LESION IMG BX SPEC US GUIDE 08/04/2022 [facility]   CERVICAL BIOPSY  W/ LOOP ELECTRODE EXCISION  02/2003   CIN 2-3   CHOLECYSTECTOMY  2015   COSMETIC SURGERY  2009   TEE WITHOUT CARDIOVERSION N/A 06/18/2023   Procedure: TRANSESOPHAGEAL ECHOCARDIOGRAM;  Surgeon: Wendall Stade, MD;  Location: [facility];  Service: Cardiovascular;  Laterality: N/A;   Patient Active Problem List   Diagnosis Date Noted   Malnutrition of moderate degree 06/19/2023   Staphylococcus aureus infection 06/19/2023   Bacteremia with signs of infection 06/18/2023   Discitis 06/15/2023   Right lateral epicondylitis 04/12/2023   Plantar fasciitis of left foot 08/17/2022   Ascending aorta dilatation -- found on CT Sep 2023 --> Recheck CT in Sep 2024 05/31/2022   Chest wall pain 04/20/2022   Traumatic hematoma of lower leg, left, initial encounter 11/03/2021   Basal cell carcinoma of back 07/22/2021   Dermatofibroma 07/22/2021   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    History of malignant neoplasm of skin 07/22/2021   Lentigo 07/22/2021   Melanocytic nevi of trunk 07/22/2021   Neoplasm of uncertain behavior of skin 07/22/2021   Other skin changes due to chronic exposure to nonionizing radiation 07/22/2021   Skin tag 07/22/2021   Low back pain 08/03/2020   Nonallopathic lesion of sacral region 08/03/2020   Nonallopathic lesion of thoracic region 08/03/2020   Nonallopathic lesion of lumbar region 08/03/2020   Class 1 obesity due to excess calories without serious comorbidity with body mass index (BMI) of 33.0 to 33.9 in adult 11/17/2017   Hypercholesterolemia, not on statin 08/18/2017   H/O abnormal cervical Papanicolaou smear 01/06/2013    PCP: Jarold Motto, PA   REFERRING PROVIDER: Jarold Motto, PA   REFERRING DIAG: [phone] (ICD-10-CM) - Discitis of lumbosacral region   Rationale for Evaluation and Treatment: Rehabilitation  THERAPY DIAG:  Other low back pain  Difficulty in walking, not elsewhere classified  Muscle weakness (generalized)  ONSET DATE: 05/29/23 fall  SUBJECTIVE:  SUBJECTIVE STATEMENT: 07/05/2023 States that the other day she had the urge to go to the bathroom and she took a couple steps and she lost complete bladder control and peed herself. States she is now wearing depends. Reports she had another incidence of losing bladder control. Only urinary incontinence at this time, no bowel incontinence.   Eval: Patient presents with husband and is standing during evaluation secondary to high levels of pain and concern.  Patient unsure if she can even lay down on table for fear she would not be able to get back up due to pain  Patient for started having pain on 9 2 of this year and her left hip where she saw a doctor who gave her some  medications.  She then went abroad to [place] where she fell during a hike and went to a physician overseas.  He gave her an injection of medications.  When she came back she saw another doctor as she was having more pain and MRI demonstrated osteomyelitis at L5-S1. She was instructed to go to the ER and was subsequently admitted to hospital from 06/15/23 - 06/20/23 for discitis, bacteremia, staphylococcus aureus infection. Given IV antibiotics. Repeat blood cultures negative.  Patient was taking gabapentin, naproxen, ibuprofen and oxy but has run out of [place] and her GABA prescription is almost up currently she is in severe pain and concerned for her quality of life once her GABA prescription runs up.  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  She is currently having severe pain with all activities movements and can barely get comfortable side-lying is most comfortable but she tosses and turns she has not been on her stomach.  It feels like she gets stuck after sitting for a while and cannot get upright and everything just locks up on her.  She has never had pain like this before.  Patient also reports that her middle finger was also possibly injured on the right hand during a transfer at the hospital it is swollen and painful.  Her right knee is also bothering her and she has noticed numbness in her right great toe it used to be in her heel but now is primarily just in her great toe.  PERTINENT HISTORY:  Migraines, GERD  PAIN:  Are you having pain? Yes: NPRS scale: 3/10 Pain location: Low back and right leg Pain description: Sharp achy throbbing unbearable Aggravating factors: Movement sitting transitional movements Relieving factors: Medication  PRECAUTIONS: Fall  RED FLAGS: None   WEIGHT BEARING RESTRICTIONS: No  FALLS:  Has patient fallen in last 6 months? Yes. 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  DIAGNOSTIC FINDINGS:  Lumbar MRI 06/15/2023    IMPRESSION: 1. Positive for Discitis Osteomyelitis at L5-S1. Pronounced epidural inflammation there, and small sacral epidural abscess tracking within the right posterior and lateral sacral epidural space from S1 through at least S3, involving the exiting right sacral nerve roots. Subsequent mild to moderate L5-S1 spinal stenosis, in part due to dural thickening and enhancement. And  bilateral L5 neural foraminal stenosis and foraminal inflammation.   2. No paraspinal muscle abscess.  No other lumbar levels involved.    PATIENT SURVEYS:  FOTO 9%  SCREENING FOR RED FLAGS: Bowel or bladder incontinence: No Spinal tumors: No Cauda equina syndrome: No Compression fracture: No Abdominal aneurysm: No  COGNITION: Overall cognitive status: Within functional limits for tasks assessed     SENSATION: Not tested    POSTURE: Slumped to forward posture with heavy use hands on rolling walker  PALPATION: No tenderness to palpation noted in lumbar musculature or glutes  LUMBAR ROM: Unable to formally test secondary to high levels of pain  AROM   Flexion   Extension   Right lateral flexion   Left lateral flexion   Right rotation   Left rotation    (Blank rows = not tested)    LE Measurements unable to formally test secondary to high levels of pain Lower Extremity Right  Left    A/PROM MMT A/PROM MMT  Hip Flexion      Hip Extension      Hip Abduction      Hip Adduction      Hip Internal rotation      Hip External rotation      Knee Flexion      Knee  Extension      Ankle Dorsiflexion      Ankle Plantarflexion      Ankle Inversion      Ankle Eversion       (Blank rows = not tested) * pain'   LUMBAR SPECIAL TESTS:  Unable to test secondary to high levels of pain FUNCTIONAL TESTS:  Sit to stand: Slow labored movements patient holding breath with increased pain strong use of arms. Unable to perform any bed mobilities today secondary to pain, fear of pain and fear of not being able to get up Stand to sit 3 attempts with elevated height of chair and strong use of her arms very painful slow-labored movements  GAIT: Distance walked: 25 feet in clinic Assistive device utilized: Walker - 2 wheeled Level of assistance: SBA Comments: Labored gait with a rolling walker, excessive weight on upper extremities, wide base of support, antalgic  TODAY'S TREATMENT:                                                                                                                              DATE:   07/05/2023  Therapeutic Exercise:  Aerobic: Supine:   Prone:  Seated:   Standing: Neuromuscular Re-education: Manual Therapy: Therapeutic Activity:   Self Care:see education below  Trigger Point Dry Needling:  Modalities:    PATIENT EDUCATION:  Education details: on anatomy, on current symptoms, on importance of immediate f/u/ on pelvic anatomy and rationale for need for imaging. Person educated: Patient Education method: Explanation, Demonstration, and Handouts Education comprehension: verbalized understanding   HOME EXERCISE PROGRAM: No [facility] at this time  ASSESSMENT:  CLINICAL IMPRESSION: 07/05/2023  Patient arrives to physical therapy in better spirits than previous sessions but with reports of total bladder incontinence upon standing.  This is occurred twice since last PT session which was earlier this week.  Patient reports she has no control when she starts going and is no longer making it to the toilet.  She is currently  wearing adult diapers.  Contacted primary care physician who also contacted her orthopedic doctor.  Dr. Recommended need for urgent imaging with report of these new progressing symptoms.  Educated patient and patient denied need for ambulance as she is not having any lower extremity symptoms at this time.  Patient reported she will immediately go to emergency department.  Eval: Patient presents to physical therapy with complaints of severe low back pain after a fall resulting in osteomyelitis of L5-S1.  Patient has been treated with a course of antibiotics in the hospital and blood work has come back negative.  She presents with use of rolling walker and severe pain with all movements and transitional movements.  Session limited on objective information today secondary to high levels of pain.  Primary goal of today's session was to reassure patient and her family and give pain management strategies to help cope with the current pain levels.  Patient is greatly limited in function due to pain in range of motion and strength deficits and would greatly benefit from skilled PT at this time.  OBJECTIVE IMPAIRMENTS: Abnormal gait, decreased activity tolerance, decreased balance, decreased coordination, decreased endurance, decreased knowledge of use of DME, decreased mobility, difficulty walking, decreased ROM, decreased strength, improper body mechanics, postural dysfunction, and pain.   ACTIVITY LIMITATIONS: carrying, lifting, bending, sitting, standing, squatting, sleeping, stairs, transfers, bed mobility, bathing, toileting, dressing, hygiene/grooming, and locomotion level  PARTICIPATION LIMITATIONS: meal prep, cleaning, laundry, driving, shopping, community activity, and occupation  PERSONAL FACTORS: Age, Fitness, and 1 comorbidity: Osteomyelitis L5-S1  are also affecting patient's functional outcome.   REHAB POTENTIAL: Good  CLINICAL DECISION MAKING: Evolving/moderate complexity  EVALUATION  COMPLEXITY: Moderate   GOALS: Goals reviewed with patient? yes  SHORT TERM GOALS: Target date: 08/10/2023 Patient will be independent in self management strategies to improve quality of life and functional outcomes. Baseline: New Program Goal status: INITIAL  2.  Patient will report at least 25 % improvement in overall symptoms and/or function to demonstrate improved functional mobility Baseline: 0% better Goal status: INITIAL  3.  Patient will be able to transition from sit to stand and stand to sit from a normal height chair without the use of her arms Baseline: Unable and very painful Goal status: INITIAL  4.  Patient will be able to walk for 15 minutes with use of assistive device as needed without severe pain to improve ambulatory mobility in the home Baseline: Requires walker 5 to 10 minutes max with severe pain afterwards Goal status: INITIAL    LONG TERM GOALS: Target date: 09/20/2023 Patient will report at least 50 % improvement in overall symptoms and/or function to demonstrate improved functional mobility Baseline: 0% better Goal status: INITIAL  2.  Patient will improve score on FOTO outcomes measure to projected score to demonstrate overall improved function and QOL Baseline: see above Goal status: INITIAL  3.  Patient will be able to walk up to 30 minutes without the use of a rolling walker and without severe pain to improve overall function and return to prior level of function Baseline: 5 to 10 minutes with rolling walker painful Goal status: INITIAL  4.  Patient will be able to sit for at least 8 hours with intermittent standing without severe pain at the end of the dayto be able to perform job tasks Baseline: Currently 5 hours but severe pain afterwards Goal status: INITIAL   PLAN:  PT FREQUENCY: 2x/week  PT DURATION: 12 weeks  PLANNED INTERVENTIONS: Therapeutic exercises, Therapeutic activity, Neuromuscular re-education, Balance training, Gait  training, Patient/Family education, Self Care, Joint mobilization, Joint manipulation, Stair training, Vestibular training, Canalith repositioning, Orthotic/Fit training, Prosthetic training, DME instructions, Aquatic Therapy, Dry Needling, Electrical stimulation, Spinal manipulation, Spinal mobilization, Cryotherapy, Moist heat, Taping, Traction, Ultrasound, Ionotophoresis 4mg /ml Dexamethasone, Manual therapy, and Re-evaluation.   PLAN FOR NEXT SESSION: Pain management strategies, follow-up with compression, follow-up with heat and use of SIJ belt, breathing strategies and positional strategies once tolerated, trial prone, trial gentle traction   12:43 PM, 07/05/23 Tereasa Coop, DPT Physical Therapy with [place]

## 2023-07-06 DIAGNOSIS — M4640 Discitis, unspecified, site unspecified: Secondary | ICD-10-CM | POA: Diagnosis not present

## 2023-07-07 DIAGNOSIS — M4640 Discitis, unspecified, site unspecified: Secondary | ICD-10-CM | POA: Diagnosis not present

## 2023-07-08 DIAGNOSIS — M4640 Discitis, unspecified, site unspecified: Secondary | ICD-10-CM | POA: Diagnosis not present

## 2023-07-08 NOTE — Plan of Care (Signed)
[facility] Tonsillectomy/Adenoidectomy, Postoperative PEDS care plan entered in error.

## 2023-07-09 ENCOUNTER — Encounter: Payer: Self-pay | Admitting: Family Medicine

## 2023-07-09 ENCOUNTER — Encounter: Payer: BC Managed Care – PPO | Admitting: Physical Therapy

## 2023-07-09 DIAGNOSIS — M4640 Discitis, unspecified, site unspecified: Secondary | ICD-10-CM | POA: Diagnosis not present

## 2023-07-10 DIAGNOSIS — M4640 Discitis, unspecified, site unspecified: Secondary | ICD-10-CM | POA: Diagnosis not present

## 2023-07-11 DIAGNOSIS — M4626 Osteomyelitis of vertebra, lumbar region: Secondary | ICD-10-CM | POA: Diagnosis not present

## 2023-07-11 DIAGNOSIS — M4640 Discitis, unspecified, site unspecified: Secondary | ICD-10-CM | POA: Diagnosis not present

## 2023-07-12 ENCOUNTER — Encounter: Payer: Self-pay | Admitting: Physical Therapy

## 2023-07-12 ENCOUNTER — Ambulatory Visit: Payer: BC Managed Care – PPO | Admitting: Physical Therapy

## 2023-07-12 DIAGNOSIS — R262 Difficulty in walking, not elsewhere classified: Secondary | ICD-10-CM | POA: Diagnosis not present

## 2023-07-12 DIAGNOSIS — M4640 Discitis, unspecified, site unspecified: Secondary | ICD-10-CM | POA: Diagnosis not present

## 2023-07-12 DIAGNOSIS — M5459 Other low back pain: Secondary | ICD-10-CM

## 2023-07-12 DIAGNOSIS — M6281 Muscle weakness (generalized): Secondary | ICD-10-CM | POA: Diagnosis not present

## 2023-07-12 NOTE — Therapy (Signed)
OUTPATIENT PHYSICAL THERAPY THORACOLUMBAR TREATMENT   Patient Name: Susan [name] MRN: [id number] DOB:01-22-1976, 47 y.o., female Today's Date: 07/12/2023  END OF SESSION:  PT End of Session - 07/12/23 1520     Visit Number 4    Number of Visits 24    Date for PT Re-Evaluation 09/20/23    Authorization Type [employer] 40 VL    Authorization - Visit Number 4    Authorization - Number of Visits 40    Progress Note Due on Visit 10    PT Start Time 1518    PT Stop Time 1556    PT Time Calculation (min) 38 min    Activity Tolerance Patient limited by pain    Behavior During Therapy Restless;Anxious              Past Medical History:  Diagnosis Date   Abnormal pap 02/2003   CIN 2-3   Basal cell carcinoma of back 07/22/2021   GERD (gastroesophageal reflux disease)    Hyperlipidemia    Migraine with aura    Past Surgical History:  Procedure Laterality Date   BREAST BIOPSY Left 08/04/2022   [place] LT BREAST BX W LOC DEV 1ST LESION IMG BX SPEC US GUIDE 08/04/2022 [facility]   CERVICAL BIOPSY  W/ LOOP ELECTRODE EXCISION  02/2003   CIN 2-3   CHOLECYSTECTOMY  2015   COSMETIC SURGERY  2009   TEE WITHOUT CARDIOVERSION N/A 06/18/2023   Procedure: TRANSESOPHAGEAL ECHOCARDIOGRAM;  Surgeon: Wendall Stade, MD;  Location: [facility];  Service: Cardiovascular;  Laterality: N/A;   Patient Active Problem List   Diagnosis Date Noted   Malnutrition of moderate degree 06/19/2023   Staphylococcus aureus infection 06/19/2023   Bacteremia with signs of infection 06/18/2023   Discitis 06/15/2023   Right lateral epicondylitis 04/12/2023   Plantar fasciitis of left foot 08/17/2022   Ascending aorta dilatation -- found on CT Sep 2023 --> Recheck CT in Sep 2024 05/31/2022   Chest wall pain 04/20/2022   Traumatic hematoma of lower leg, left, initial encounter 11/03/2021   Basal cell carcinoma of back 07/22/2021   Dermatofibroma 07/22/2021   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    History of malignant neoplasm of skin 07/22/2021   Lentigo 07/22/2021   Melanocytic nevi of trunk 07/22/2021   Neoplasm of uncertain behavior of skin 07/22/2021   Other skin changes due to chronic exposure to nonionizing radiation 07/22/2021   Skin tag 07/22/2021   Low back pain 08/03/2020   Nonallopathic lesion of sacral region 08/03/2020   Nonallopathic lesion of thoracic region 08/03/2020   Nonallopathic lesion of lumbar region 08/03/2020   Class 1 obesity due to excess calories without serious comorbidity with body mass index (BMI) of 33.0 to 33.9 in adult 11/17/2017   Hypercholesterolemia, not on statin 08/18/2017   H/O abnormal cervical Papanicolaou smear 01/06/2013    PCP: Jarold Motto, PA   REFERRING PROVIDER: Jarold Motto, PA   REFERRING DIAG: [phone] (ICD-10-CM) - Discitis of lumbosacral region   Rationale for Evaluation and Treatment: Rehabilitation  THERAPY DIAG:  Other low back pain  Difficulty in walking, not elsewhere classified  Muscle weakness (generalized)  ONSET DATE: 05/29/23 fall  SUBJECTIVE:  SUBJECTIVE STATEMENT: 07/12/2023 States she is trying to wean off the oxy. States she had a great day yesterday. States that she didn't take it at night and did wake up sweating at 230am. States she didn't know if she was having withdrawals from the oxy. States that she has been walking some without the walker. Sometimes she has intense sciatic symptoms down the leg. Gets pain in the right hip and it goes down the leg. Reports no leakage but she is going to the bathroom as soon as she gets the urge. States most nights she can sleep all night without having to go to the bathroom.   Eval: Patient presents with husband and is standing during evaluation secondary to high levels of pain  and concern.  Patient unsure if she can even lay down on table for fear she would not be able to get back up due to pain  Patient for started having pain on 9 2 of this year and her left hip where she saw a doctor who gave her some medications.  She then went abroad to [place] where she fell during a hike and went to a physician overseas.  He gave her an injection of medications.  When she came back she saw another doctor as she was having more pain and MRI demonstrated osteomyelitis at L5-S1. She was instructed to go to the ER and was subsequently admitted to hospital from 06/15/23 - 06/20/23 for discitis, bacteremia, staphylococcus aureus infection. Given IV antibiotics. Repeat blood cultures negative.  Patient was taking gabapentin, naproxen, ibuprofen and oxy but has run out of [place] and her GABA prescription is almost up currently she is in severe pain and concerned for her quality of life once her GABA prescription runs up.  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  She is currently having severe pain with all activities movements and can barely get comfortable side-lying is most comfortable but she tosses and turns she has not been on her stomach.  It feels like she gets stuck after sitting for a while and cannot get upright and everything just locks up on her.  She has never had pain like this before.  Patient also reports that her middle finger was also possibly injured on the right hand during a transfer at the hospital it is swollen and painful.  Her right knee is also bothering her and she has noticed numbness in her right great toe it used to be in her heel but now is primarily just in her great toe.  PERTINENT HISTORY:  Migraines, GERD  PAIN:  Are you having pain? Yes: NPRS scale: 6/10 Pain location: Low back and right leg Pain description: Sharp achy throbbing unbearable Aggravating factors: Movement sitting transitional movements Relieving factors:  Medication  PRECAUTIONS: Fall  RED FLAGS: None   WEIGHT BEARING RESTRICTIONS: No  FALLS:  Has patient fallen in last 6 months? Yes. 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  DIAGNOSTIC FINDINGS:  Lumbar MRI 06/15/2023    IMPRESSION: 1. Positive  for Discitis Osteomyelitis at L5-S1. Pronounced epidural inflammation there, and small sacral epidural abscess tracking within the right posterior and lateral sacral epidural space from S1 through at least S3, involving the exiting right sacral nerve roots. Subsequent mild to moderate L5-S1 spinal stenosis, in part due to dural thickening and enhancement. And bilateral L5 neural foraminal stenosis and foraminal inflammation.   2. No paraspinal muscle abscess.  No other lumbar levels involved.    PATIENT SURVEYS:  FOTO 9%  SCREENING FOR RED FLAGS: Bowel or bladder incontinence: No Spinal tumors: No Cauda equina syndrome: No Compression fracture: No Abdominal aneurysm: No  COGNITION: Overall cognitive status: Within functional limits for tasks assessed     SENSATION: Not tested    POSTURE: Slumped to forward posture with heavy use hands on rolling walker  PALPATION: No tenderness to palpation noted in lumbar musculature or glutes  LUMBAR ROM: Unable to formally test secondary to high levels of pain  AROM   Flexion   Extension   Right lateral flexion   Left lateral flexion   Right rotation   Left rotation    (Blank rows = not tested)    LE Measurements unable to formally test  secondary to high levels of pain Lower Extremity Right  Left    A/PROM MMT A/PROM MMT  Hip Flexion      Hip Extension      Hip Abduction      Hip Adduction      Hip Internal rotation      Hip External rotation      Knee Flexion      Knee Extension      Ankle Dorsiflexion      Ankle Plantarflexion      Ankle Inversion      Ankle Eversion       (Blank rows = not tested) * pain'   LUMBAR SPECIAL TESTS:  Unable to test secondary to high levels of pain FUNCTIONAL TESTS:  Sit to stand: Slow labored movements patient holding breath with increased pain strong use of arms. Unable to perform any bed mobilities today secondary to pain, fear of pain and fear of not being able to get up Stand to sit 3 attempts with elevated height of chair and strong use of her arms very painful slow-labored movements  GAIT: Distance walked: 25 feet in clinic Assistive device utilized: Walker - 2 wheeled Level of assistance: SBA Comments: Labored gait with a rolling walker, excessive weight on upper extremities, wide base of support, antalgic  TODAY'S TREATMENT:                                                                                                                              DATE:   07/12/2023  Therapeutic Exercise:  Review of presentation/what to do to continue to manage symptoms - answered all questions  Supine:   Prone: lying over 2 pillows with breathing - 20 minutes, glute squeeze x20 5"  holds focus on control and relaxing,   Seated:   Standing: Neuromuscular Re-education: Manual Therapy: Therapeutic Activity:   Self Care:see education below  Trigger Point Dry Needling:  Modalities:    PATIENT EDUCATION:  Education details: on continued MD f/u, about typically healing progression, about avoiding bending/lifting/twisting for 6-8 weeks  Person educated: Patient Education method: Explanation, Demonstration, and Handouts Education comprehension: verbalized  understanding   HOME EXERCISE PROGRAM: [id number]  ASSESSMENT:  CLINICAL IMPRESSION: 07/12/2023 Session focused on review of current symptoms, continued progress and anticipated rehab progression. Dicussed limited bending/lifting/twisting and offloading back intermittently. Tolerated prone position well and encouraged patient to perform after work day but to not sleep in this position. Overall patient in better spirits and pain levels on this date. Encouraged patient to f/u with PCP about recent missed period and MD about desire for additional imaging. Will continue with current POC as tolerated.   Eval: Patient presents to physical therapy with complaints of severe low back pain after a fall resulting in osteomyelitis of L5-S1.  Patient has been treated with a course of antibiotics in the hospital and blood work has come back negative.  She presents with use of rolling walker and severe pain with all movements and transitional movements.  Session limited on objective information today secondary to high levels of pain.  Primary goal of today's session was to reassure patient and her family and give pain management strategies to help cope with the current pain levels.  Patient is greatly limited in function due to pain in range of motion and strength deficits and would greatly benefit from skilled PT at this time.  OBJECTIVE IMPAIRMENTS: Abnormal gait, decreased activity tolerance, decreased balance, decreased coordination, decreased endurance, decreased knowledge of use of DME, decreased mobility, difficulty walking, decreased ROM, decreased strength, improper body mechanics, postural dysfunction, and pain.   ACTIVITY LIMITATIONS: carrying, lifting, bending, sitting, standing, squatting, sleeping, stairs, transfers, bed mobility, bathing, toileting, dressing, hygiene/grooming, and locomotion level  PARTICIPATION LIMITATIONS: meal prep, cleaning, laundry, driving, shopping, community activity, and  occupation  PERSONAL FACTORS: Age, Fitness, and 1 comorbidity: Osteomyelitis L5-S1  are also affecting patient's functional outcome.   REHAB POTENTIAL: Good  CLINICAL DECISION MAKING: Evolving/moderate complexity  EVALUATION COMPLEXITY: Moderate   GOALS: Goals reviewed with patient? yes  SHORT TERM GOALS: Target date: 08/10/2023 Patient will be independent in self management strategies to improve quality of life and functional outcomes. Baseline: New Program Goal status: INITIAL  2.  Patient will report at least 25 % improvement in overall symptoms and/or function to demonstrate improved functional mobility Baseline: 0% better Goal status: INITIAL  3.  Patient will be able to transition from sit to stand and stand to sit from a normal height chair without the use of her arms Baseline: Unable and very painful Goal status: INITIAL  4.  Patient will be able to walk for 15 minutes with use of assistive device as needed without severe pain to improve ambulatory mobility in the home Baseline: Requires walker 5 to 10 minutes max with severe pain afterwards Goal status: INITIAL    LONG TERM GOALS: Target date: 09/20/2023 Patient will report at least 50 % improvement in overall symptoms and/or function to demonstrate improved functional mobility Baseline: 0% better Goal status: INITIAL  2.  Patient will improve score on FOTO outcomes measure to projected score to demonstrate overall improved function and QOL Baseline: see above Goal status: INITIAL  3.  Patient will be able to walk up to 30 minutes  without the use of a rolling walker and without severe pain to improve overall function and return to prior level of function Baseline: 5 to 10 minutes with rolling walker painful Goal status: INITIAL  4.  Patient will be able to sit for at least 8 hours with intermittent standing without severe pain at the end of the dayto be able to perform job tasks Baseline: Currently 5 hours but  severe pain afterwards Goal status: INITIAL   PLAN:  PT FREQUENCY: 2x/week  PT DURATION: 12 weeks  PLANNED INTERVENTIONS: Therapeutic exercises, Therapeutic activity, Neuromuscular re-education, Balance training, Gait training, Patient/Family education, Self Care, Joint mobilization, Joint manipulation, Stair training, Vestibular training, Canalith repositioning, Orthotic/Fit training, Prosthetic training, DME instructions, Aquatic Therapy, Dry Needling, Electrical stimulation, Spinal manipulation, Spinal mobilization, Cryotherapy, Moist heat, Taping, Traction, Ultrasound, Ionotophoresis 4mg /ml Dexamethasone, Manual therapy, and Re-evaluation.   PLAN FOR NEXT SESSION: Pain management strategies, follow-up with compression, follow-up with heat and use of SIJ belt, breathing strategies and positional strategies once tolerated, trial prone, trial gentle traction   4:33 PM, 07/12/23 Tereasa Coop, DPT Physical Therapy with [facility]

## 2023-07-13 ENCOUNTER — Other Ambulatory Visit (HOSPITAL_BASED_OUTPATIENT_CLINIC_OR_DEPARTMENT_OTHER): Payer: Self-pay

## 2023-07-13 DIAGNOSIS — M4640 Discitis, unspecified, site unspecified: Secondary | ICD-10-CM | POA: Diagnosis not present

## 2023-07-14 DIAGNOSIS — M4640 Discitis, unspecified, site unspecified: Secondary | ICD-10-CM | POA: Diagnosis not present

## 2023-07-15 DIAGNOSIS — M4640 Discitis, unspecified, site unspecified: Secondary | ICD-10-CM | POA: Diagnosis not present

## 2023-07-16 ENCOUNTER — Other Ambulatory Visit: Payer: Self-pay | Admitting: Physician Assistant

## 2023-07-16 ENCOUNTER — Other Ambulatory Visit: Payer: Self-pay | Admitting: Family Medicine

## 2023-07-16 ENCOUNTER — Encounter: Payer: Self-pay | Admitting: Family Medicine

## 2023-07-16 DIAGNOSIS — M4640 Discitis, unspecified, site unspecified: Secondary | ICD-10-CM | POA: Diagnosis not present

## 2023-07-16 DIAGNOSIS — M4647 Discitis, unspecified, lumbosacral region: Secondary | ICD-10-CM

## 2023-07-16 NOTE — Telephone Encounter (Signed)
Susan [name], medication was discontinued by Dr. Dewayne Shorter. Is pt suppose to be taking Pravastatin 80 mg ? Pt is requesting a refill. Please advise.

## 2023-07-17 ENCOUNTER — Other Ambulatory Visit: Payer: Self-pay | Admitting: *Deleted

## 2023-07-17 ENCOUNTER — Ambulatory Visit (INDEPENDENT_AMBULATORY_CARE_PROVIDER_SITE_OTHER): Payer: BC Managed Care – PPO | Admitting: Physical Therapy

## 2023-07-17 ENCOUNTER — Other Ambulatory Visit: Payer: Self-pay | Admitting: Family Medicine

## 2023-07-17 ENCOUNTER — Encounter: Payer: Self-pay | Admitting: Physical Therapy

## 2023-07-17 DIAGNOSIS — M5459 Other low back pain: Secondary | ICD-10-CM | POA: Diagnosis not present

## 2023-07-17 DIAGNOSIS — R262 Difficulty in walking, not elsewhere classified: Secondary | ICD-10-CM | POA: Diagnosis not present

## 2023-07-17 DIAGNOSIS — M4640 Discitis, unspecified, site unspecified: Secondary | ICD-10-CM | POA: Diagnosis not present

## 2023-07-17 DIAGNOSIS — M6281 Muscle weakness (generalized): Secondary | ICD-10-CM

## 2023-07-17 NOTE — Therapy (Signed)
OUTPATIENT PHYSICAL THERAPY THORACOLUMBAR TREATMENT   Patient Name: Susan [name] MRN: [id number] DOB:May 02, 1976, 47 y.o., female Today's Date: 07/17/2023  END OF SESSION:  PT End of Session - 07/17/23 1511     Visit Number 5    Number of Visits 24    Date for PT Re-Evaluation 09/20/23    Authorization Type [employer] 40 VL    Authorization - Visit Number 5    Authorization - Number of Visits 40    Progress Note Due on Visit 10    PT Start Time 1515    PT Stop Time 1554    PT Time Calculation (min) 39 min    Activity Tolerance Patient limited by pain    Behavior During Therapy Restless;Anxious              Past Medical History:  Diagnosis Date   Abnormal pap 02/2003   CIN 2-3   Basal cell carcinoma of back 07/22/2021   GERD (gastroesophageal reflux disease)    Hyperlipidemia    Migraine with aura    Past Surgical History:  Procedure Laterality Date   BREAST BIOPSY Left 08/04/2022   [place] LT BREAST BX W LOC DEV 1ST LESION IMG BX SPEC US GUIDE 08/04/2022 [facility]   CERVICAL BIOPSY  W/ LOOP ELECTRODE EXCISION  02/2003   CIN 2-3   CHOLECYSTECTOMY  2015   COSMETIC SURGERY  2009   TEE WITHOUT CARDIOVERSION N/A 06/18/2023   Procedure: TRANSESOPHAGEAL ECHOCARDIOGRAM;  Surgeon: Wendall Stade, MD;  Location: [facility];  Service: Cardiovascular;  Laterality: N/A;   Patient Active Problem List   Diagnosis Date Noted   Malnutrition of moderate degree 06/19/2023   Staphylococcus aureus infection 06/19/2023   Bacteremia with signs of infection 06/18/2023   Discitis 06/15/2023   Right lateral epicondylitis 04/12/2023   Plantar fasciitis of left foot 08/17/2022   Ascending aorta dilatation -- found on CT Sep 2023 --> Recheck CT in Sep 2024 05/31/2022   Chest wall pain 04/20/2022   Traumatic hematoma of lower leg, left, initial encounter 11/03/2021   Basal cell carcinoma of back 07/22/2021   Dermatofibroma 07/22/2021   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    History of malignant neoplasm of skin 07/22/2021   Lentigo 07/22/2021   Melanocytic nevi of trunk 07/22/2021   Neoplasm of uncertain behavior of skin 07/22/2021   Other skin changes due to chronic exposure to nonionizing radiation 07/22/2021   Skin tag 07/22/2021   Low back pain 08/03/2020   Nonallopathic lesion of sacral region 08/03/2020   Nonallopathic lesion of thoracic region 08/03/2020   Nonallopathic lesion of lumbar region 08/03/2020   Class 1 obesity due to excess calories without serious comorbidity with body mass index (BMI) of 33.0 to 33.9 in adult 11/17/2017   Hypercholesterolemia, not on statin 08/18/2017   H/O abnormal cervical Papanicolaou smear 01/06/2013    PCP: Jarold Motto, PA   REFERRING PROVIDER: Jarold Motto, PA   REFERRING DIAG: [phone] (ICD-10-CM) - Discitis of lumbosacral region   Rationale for Evaluation and Treatment: Rehabilitation  THERAPY DIAG:  Other low back pain  Difficulty in walking, not elsewhere classified  Muscle weakness (generalized)  ONSET DATE: 05/29/23 fall  SUBJECTIVE:  SUBJECTIVE STATEMENT: 07/17/2023 States that her sciatica has been terrible since Friday. States that she has been icing and doing her exercises. States she had a delay in her medication and pain with driving. Had to work from home yesterday.  Eval: Patient presents with husband and is standing during evaluation secondary to high levels of pain and concern.  Patient unsure if she can even lay down on table for fear she would not be able to get back up due to pain  Patient for started having pain on 9 2 of this year and her left hip where she saw a doctor who gave her some medications.  She then went abroad to [place] where she fell during a hike and went to a physician overseas.   He gave her an injection of medications.  When she came back she saw another doctor as she was having more pain and MRI demonstrated osteomyelitis at L5-S1. She was instructed to go to the ER and was subsequently admitted to hospital from 06/15/23 - 06/20/23 for discitis, bacteremia, staphylococcus aureus infection. Given IV antibiotics. Repeat blood cultures negative.  Patient was taking gabapentin, naproxen, ibuprofen and oxy but has run out of [place] and her GABA prescription is almost up currently she is in severe pain and concerned for her quality of life once her GABA prescription runs up.  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  She is currently having severe pain with all activities movements and can barely get comfortable side-lying is most comfortable but she tosses and turns she has not been on her stomach.  It feels like she gets stuck after sitting for a while and cannot get upright and everything just locks up on her.  She has never had pain like this before.  Patient also reports that her middle finger was also possibly injured on the right hand during a transfer at the hospital it is swollen and painful.  Her right knee is also bothering her and she has noticed numbness in her right great toe it used to be in her heel but now is primarily just in her great toe.  PERTINENT HISTORY:  Migraines, GERD  PAIN:  Are you having pain? Yes: NPRS scale: 2/10 Pain location: right hip dull ache radiating down leg and into calf Pain description: Sharp achy throbbing unbearable Aggravating factors: Movement sitting transitional movements Relieving factors: Medication  PRECAUTIONS: Fall  RED FLAGS: None   WEIGHT BEARING RESTRICTIONS: No  FALLS:  Has patient fallen in last 6 months? Yes. 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  DIAGNOSTIC FINDINGS:  Lumbar MRI 06/15/2023    IMPRESSION: 1. Positive for Discitis Osteomyelitis at L5-S1. Pronounced epidural inflammation there, and small sacral epidural abscess tracking within the right posterior and lateral sacral epidural space from S1 through at least S3, involving the exiting right sacral nerve roots. Subsequent mild to moderate L5-S1 spinal stenosis, in part due to dural thickening and enhancement. And bilateral L5 neural foraminal stenosis and foraminal inflammation.   2. No  paraspinal muscle abscess.  No other lumbar levels involved.    PATIENT SURVEYS:  FOTO 9%  SCREENING FOR RED FLAGS: Bowel or bladder incontinence: No Spinal tumors: No Cauda equina syndrome: No Compression fracture: No Abdominal aneurysm: No  COGNITION: Overall cognitive status: Within functional limits for tasks assessed     SENSATION: Not tested    POSTURE: Slumped to forward posture with heavy use hands on rolling walker  PALPATION: No tenderness to palpation noted in lumbar musculature or glutes  LUMBAR ROM: Unable to formally test secondary to high levels of pain  AROM   Flexion   Extension   Right lateral flexion   Left lateral flexion   Right rotation   Left rotation    (Blank rows = not tested)    LE Measurements unable to formally test secondary to high levels of pain Lower Extremity Right  Left    A/PROM MMT A/PROM MMT  Hip Flexion      Hip Extension      Hip Abduction      Hip Adduction      Hip Internal rotation      Hip External rotation      Knee Flexion      Knee Extension      Ankle Dorsiflexion      Ankle Plantarflexion      Ankle Inversion       Ankle Eversion       (Blank rows = not tested) * pain'   LUMBAR SPECIAL TESTS:  Unable to test secondary to high levels of pain FUNCTIONAL TESTS:  Sit to stand: Slow labored movements patient holding breath with increased pain strong use of arms. Unable to perform any bed mobilities today secondary to pain, fear of pain and fear of not being able to get up Stand to sit 3 attempts with elevated height of chair and strong use of her arms very painful slow-labored movements  GAIT: Distance walked: 25 feet in clinic Assistive device utilized: Walker - 2 wheeled Level of assistance: SBA Comments: Labored gait with a rolling walker, excessive weight on upper extremities, wide base of support, antalgic  TODAY'S TREATMENT:                                                                                                                              DATE:   07/17/2023  Therapeutic Exercise:    Supine:  SCIATIC STRETCH WITH STRAP 10 MINUTES, bent knee fall outs 2 minutes, bent knee fall ins 2 minutes, piriformis stretch IR and ER B 2 minutes each side, LTR 2 minutes x2 Prone:    Seated: long exhale breathing supine 5 minutes, sitting 5 minutes  Standing: Neuromuscular Re-education: Manual Therapy: Therapeutic Activity:   Self Care:see education below  Trigger Point Dry Needling:  Modalities:    PATIENT EDUCATION:  Education details: on how to address sciatica Person educated: Patient Education method: Explanation, Demonstration, and Handouts Education comprehension: verbalized understanding  HOME EXERCISE PROGRAM: [id number]  ASSESSMENT:  CLINICAL IMPRESSION: 07/17/2023 Improved tolerance to interventions on this date. Able to add sciatic stretches and table mobility exercises which were overall tolerated well. Sudden movements caused slight increase in pain, but this resolved with rocking ships side to side and resting afterwards. Overall patient doing better and will continue  to benefit from skilled PT at this time.   Eval: Patient presents to physical therapy with complaints of severe low back pain after a fall resulting in osteomyelitis of L5-S1.  Patient has been treated with a course of antibiotics in the hospital and blood work has come back negative.  She presents with use of rolling walker and severe pain with all movements and transitional movements.  Session limited on objective information today secondary to high levels of pain.  Primary goal of today's session was to reassure patient and her family and give pain management strategies to help cope with the current pain levels.  Patient is greatly limited in function due to pain in range of motion and strength deficits and would greatly benefit from skilled PT at this time.  OBJECTIVE IMPAIRMENTS: Abnormal gait, decreased activity tolerance, decreased balance, decreased coordination, decreased endurance, decreased knowledge of use of DME, decreased mobility, difficulty walking, decreased ROM, decreased strength, improper body mechanics, postural dysfunction, and pain.   ACTIVITY LIMITATIONS: carrying, lifting, bending, sitting, standing, squatting, sleeping, stairs, transfers, bed mobility, bathing, toileting, dressing, hygiene/grooming, and locomotion level  PARTICIPATION LIMITATIONS: meal prep, cleaning, laundry, driving, shopping, community activity, and occupation  PERSONAL FACTORS: Age, Fitness, and 1 comorbidity: Osteomyelitis L5-S1  are also affecting patient's functional outcome.   REHAB POTENTIAL: Good  CLINICAL DECISION MAKING: Evolving/moderate complexity  EVALUATION COMPLEXITY: Moderate   GOALS: Goals reviewed with patient? yes  SHORT TERM GOALS: Target date: 08/10/2023 Patient will be independent in self management strategies to improve quality of life and functional outcomes. Baseline: New Program Goal status: INITIAL  2.  Patient will report at least 25 % improvement in overall symptoms  and/or function to demonstrate improved functional mobility Baseline: 0% better Goal status: INITIAL  3.  Patient will be able to transition from sit to stand and stand to sit from a normal height chair without the use of her arms Baseline: Unable and very painful Goal status: INITIAL  4.  Patient will be able to walk for 15 minutes with use of assistive device as needed without severe pain to improve ambulatory mobility in the home Baseline: Requires walker 5 to 10 minutes max with severe pain afterwards Goal status: INITIAL    LONG TERM GOALS: Target date: 09/20/2023 Patient will report at least 50 % improvement in overall symptoms and/or function to demonstrate improved functional mobility Baseline: 0% better Goal status: INITIAL  2.  Patient will improve score on FOTO outcomes measure to projected score to demonstrate overall improved function and QOL Baseline: see above Goal status: INITIAL  3.  Patient will be able to walk up to 30 minutes without the use of a rolling walker and without severe pain to improve overall function and return to prior level of function Baseline: 5 to 10 minutes with rolling walker painful Goal status: INITIAL  4.  Patient will be able to sit for at least 8 hours with intermittent standing without severe pain at the end of the dayto be able to perform job tasks Baseline: Currently 5 hours but severe pain afterwards Goal status: INITIAL   PLAN:  PT FREQUENCY: 2x/week  PT DURATION: 12  weeks  PLANNED INTERVENTIONS: Therapeutic exercises, Therapeutic activity, Neuromuscular re-education, Balance training, Gait training, Patient/Family education, Self Care, Joint mobilization, Joint manipulation, Stair training, Vestibular training, Canalith repositioning, Orthotic/Fit training, Prosthetic training, DME instructions, Aquatic Therapy, Dry Needling, Electrical stimulation, Spinal manipulation, Spinal mobilization, Cryotherapy, Moist heat, Taping,  Traction, Ultrasound, Ionotophoresis 4mg /ml Dexamethasone, Manual therapy, and Re-evaluation.   PLAN FOR NEXT SESSION: Pain management strategies, follow-up with compression, follow-up with heat and use of SIJ belt, breathing strategies and positional strategies once tolerated, trial prone, trial gentle traction   4:01 PM, 07/17/23 Tereasa Coop, DPT Physical Therapy with [facility]

## 2023-07-18 DIAGNOSIS — M4626 Osteomyelitis of vertebra, lumbar region: Secondary | ICD-10-CM | POA: Diagnosis not present

## 2023-07-18 DIAGNOSIS — M4640 Discitis, unspecified, site unspecified: Secondary | ICD-10-CM | POA: Diagnosis not present

## 2023-07-19 ENCOUNTER — Encounter: Payer: Self-pay | Admitting: Physical Therapy

## 2023-07-19 ENCOUNTER — Ambulatory Visit (INDEPENDENT_AMBULATORY_CARE_PROVIDER_SITE_OTHER): Payer: BC Managed Care – PPO | Admitting: Physical Therapy

## 2023-07-19 DIAGNOSIS — M6281 Muscle weakness (generalized): Secondary | ICD-10-CM | POA: Diagnosis not present

## 2023-07-19 DIAGNOSIS — M5459 Other low back pain: Secondary | ICD-10-CM

## 2023-07-19 DIAGNOSIS — M4640 Discitis, unspecified, site unspecified: Secondary | ICD-10-CM | POA: Diagnosis not present

## 2023-07-19 DIAGNOSIS — R262 Difficulty in walking, not elsewhere classified: Secondary | ICD-10-CM

## 2023-07-19 NOTE — Therapy (Signed)
OUTPATIENT PHYSICAL THERAPY THORACOLUMBAR TREATMENT   Patient Name: Susan [name] MRN: [id number] DOB:1976-03-12, 47 y.o., female Today's Date: 07/19/2023  END OF SESSION:  PT End of Session - 07/19/23 1519     Visit Number 6    Number of Visits 24    Date for PT Re-Evaluation 09/20/23    Authorization Type [employer] 40 VL    Authorization - Visit Number 6    Authorization - Number of Visits 40    Progress Note Due on Visit 10    PT Start Time 1521    PT Stop Time 1601    PT Time Calculation (min) 40 min    Activity Tolerance Patient limited by pain    Behavior During Therapy Restless;Anxious              Past Medical History:  Diagnosis Date   Abnormal pap 02/2003   CIN 2-3   Basal cell carcinoma of back 07/22/2021   GERD (gastroesophageal reflux disease)    Hyperlipidemia    Migraine with aura    Past Surgical History:  Procedure Laterality Date   BREAST BIOPSY Left 08/04/2022   [place] LT BREAST BX W LOC DEV 1ST LESION IMG BX SPEC US GUIDE 08/04/2022 [facility]   CERVICAL BIOPSY  W/ LOOP ELECTRODE EXCISION  02/2003   CIN 2-3   CHOLECYSTECTOMY  2015   COSMETIC SURGERY  2009   TEE WITHOUT CARDIOVERSION N/A 06/18/2023   Procedure: TRANSESOPHAGEAL ECHOCARDIOGRAM;  Surgeon: Wendall Stade, MD;  Location: [facility];  Service: Cardiovascular;  Laterality: N/A;   Patient Active Problem List   Diagnosis Date Noted   Malnutrition of moderate degree 06/19/2023   Staphylococcus aureus infection 06/19/2023   Bacteremia with signs of infection 06/18/2023   Discitis 06/15/2023   Right lateral epicondylitis 04/12/2023   Plantar fasciitis of left foot 08/17/2022   Ascending aorta dilatation -- found on CT Sep 2023 --> Recheck CT in Sep 2024 05/31/2022   Chest wall pain 04/20/2022   Traumatic hematoma of lower leg, left, initial encounter 11/03/2021   Basal cell carcinoma of back 07/22/2021   Dermatofibroma 07/22/2021   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    History of malignant neoplasm of skin 07/22/2021   Lentigo 07/22/2021   Melanocytic nevi of trunk 07/22/2021   Neoplasm of uncertain behavior of skin 07/22/2021   Other skin changes due to chronic exposure to nonionizing radiation 07/22/2021   Skin tag 07/22/2021   Low back pain 08/03/2020   Nonallopathic lesion of sacral region 08/03/2020   Nonallopathic lesion of thoracic region 08/03/2020   Nonallopathic lesion of lumbar region 08/03/2020   Class 1 obesity due to excess calories without serious comorbidity with body mass index (BMI) of 33.0 to 33.9 in adult 11/17/2017   Hypercholesterolemia, not on statin 08/18/2017   H/O abnormal cervical Papanicolaou smear 01/06/2013    PCP: Jarold Motto, PA   REFERRING PROVIDER: Jarold Motto, PA   REFERRING DIAG: [phone] (ICD-10-CM) - Discitis of lumbosacral region   Rationale for Evaluation and Treatment: Rehabilitation  THERAPY DIAG:  Other low back pain  Difficulty in walking, not elsewhere classified  Muscle weakness (generalized)  ONSET DATE: 05/29/23 fall with subsequent osteomyelitis L5/S1  SUBJECTIVE:  SUBJECTIVE STATEMENT: 07/19/2023 Exercises are helping. States she has been breathing. States that her sciatica is better. States that she hasn't had to lay seat back. States that she has been stretching her right leg is out and her left side is bothering her.   Eval: Patient presents with husband and is standing during evaluation secondary to high levels of pain and concern.  Patient unsure if she can even lay down on table for fear she would not be able to get back up due to pain  Patient for started having pain on 9 2 of this year and her left hip where she saw a doctor who gave her some medications.  She then went abroad to [place] where  she fell during a hike and went to a physician overseas.  He gave her an injection of medications.  When she came back she saw another doctor as she was having more pain and MRI demonstrated osteomyelitis at L5-S1. She was instructed to go to the ER and was subsequently admitted to hospital from 06/15/23 - 06/20/23 for discitis, bacteremia, staphylococcus aureus infection. Given IV antibiotics. Repeat blood cultures negative.  Patient was taking gabapentin, naproxen, ibuprofen and oxy but has run out of [place] and her GABA prescription is almost up currently she is in severe pain and concerned for her quality of life once her GABA prescription runs up.  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  She is currently having severe pain with all activities movements and can barely get comfortable side-lying is most comfortable but she tosses and turns she has not been on her stomach.  It feels like she gets stuck after sitting for a while and cannot get upright and everything just locks up on her.  She has never had pain like this before.  Patient also reports that her middle finger was also possibly injured on the right hand during a transfer at the hospital it is swollen and painful.  Her right knee is also bothering her and she has noticed numbness in her right great toe it used to be in her heel but now is primarily just in her great toe.  PERTINENT HISTORY:  Migraines, GERD  PAIN:  Are you having pain? Yes: NPRS scale: 2/10 Pain location: right hip dull ache radiating down leg and into calf Pain description: Sharp achy throbbing unbearable Aggravating factors: Movement sitting transitional movements Relieving factors: Medication  PRECAUTIONS: Fall  RED FLAGS: None   WEIGHT BEARING RESTRICTIONS: No  FALLS:  Has patient fallen in last 6 months? Yes. 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  DIAGNOSTIC FINDINGS:  Lumbar MRI 06/15/2023    IMPRESSION: 1. Positive for Discitis Osteomyelitis at L5-S1. Pronounced epidural inflammation there, and small sacral epidural abscess tracking within the right posterior and lateral sacral epidural space from S1 through at least S3, involving the exiting right sacral nerve roots. Subsequent mild to moderate L5-S1 spinal stenosis, in part due to dural thickening and enhancement. And bilateral L5 neural foraminal stenosis and foraminal  inflammation.   2. No paraspinal muscle abscess.  No other lumbar levels involved.    PATIENT SURVEYS:  FOTO 9%  SCREENING FOR RED FLAGS: Bowel or bladder incontinence: No Spinal tumors: No Cauda equina syndrome: No Compression fracture: No Abdominal aneurysm: No  COGNITION: Overall cognitive status: Within functional limits for tasks assessed     SENSATION: Not tested    POSTURE: Slumped to forward posture with heavy use hands on rolling walker  PALPATION: No tenderness to palpation noted in lumbar musculature or glutes  LUMBAR ROM: Unable to formally test secondary to high levels of pain  AROM   Flexion   Extension   Right lateral flexion   Left lateral flexion   Right rotation   Left rotation    (Blank rows = not tested)    LE Measurements unable to formally test secondary to high levels of pain Lower Extremity Right  Left    A/PROM MMT A/PROM MMT  Hip Flexion      Hip Extension      Hip Abduction      Hip Adduction      Hip Internal rotation      Hip External rotation      Knee Flexion      Knee Extension      Ankle  Dorsiflexion      Ankle Plantarflexion      Ankle Inversion      Ankle Eversion       (Blank rows = not tested) * pain'   LUMBAR SPECIAL TESTS:  Unable to test secondary to high levels of pain FUNCTIONAL TESTS:  Sit to stand: Slow labored movements patient holding breath with increased pain strong use of arms. Unable to perform any bed mobilities today secondary to pain, fear of pain and fear of not being able to get up Stand to sit 3 attempts with elevated height of chair and strong use of her arms very painful slow-labored movements  GAIT: Distance walked: 25 feet in clinic Assistive device utilized: Walker - 2 wheeled Level of assistance: SBA Comments: Labored gait with a rolling walker, excessive weight on upper extremities, wide base of support, antalgic  TODAY'S TREATMENT:                                                                                                                              DATE:   07/19/2023  Therapeutic Exercise: Supine:  hip add isometric 5" holds 2 minutes, hip abd 5" holds 3 minutes, piriforms stretch 2 minutes Each leg IR/ER, quad iso 5" holds B 2 minutes Each leg, pelvis tilts 2 minutes anterior and 2 minutes posterior   Prone:    Seated: hip add iso light activation ball 2 mnutes  Standing: Neuromuscular Re-education: Manual Therapy: Therapeutic Activity:   Self Care:see education below  Trigger Point Dry Needling:  Modalities:    PATIENT EDUCATION:  Education details: on HEP and rationale for interventions Person educated: Patient  Education method: Explanation, Demonstration, and Handouts Education comprehension: verbalized understanding   HOME EXERCISE PROGRAM: [id number]  ASSESSMENT:  CLINICAL IMPRESSION: 07/19/2023 Able to add/progress exercises to include isometrics. Paitent very weak and uncoordinated. Required tactile cues for appropriate muscle activation and difficulty performing upright exercises as it was significantly  harder. Pain and fatigue noted with transition to standing, transfer took multiple attempts due to discomfort. Despite this, overall patient doing well and would continue to benefit from skilled PT.   Eval: Patient presents to physical therapy with complaints of severe low back pain after a fall resulting in osteomyelitis of L5-S1.  Patient has been treated with a course of antibiotics in the hospital and blood work has come back negative.  She presents with use of rolling walker and severe pain with all movements and transitional movements.  Session limited on objective information today secondary to high levels of pain.  Primary goal of today's session was to reassure patient and her family and give pain management strategies to help cope with the current pain levels.  Patient is greatly limited in function due to pain in range of motion and strength deficits and would greatly benefit from skilled PT at this time.  OBJECTIVE IMPAIRMENTS: Abnormal gait, decreased activity tolerance, decreased balance, decreased coordination, decreased endurance, decreased knowledge of use of DME, decreased mobility, difficulty walking, decreased ROM, decreased strength, improper body mechanics, postural dysfunction, and pain.   ACTIVITY LIMITATIONS: carrying, lifting, bending, sitting, standing, squatting, sleeping, stairs, transfers, bed mobility, bathing, toileting, dressing, hygiene/grooming, and locomotion level  PARTICIPATION LIMITATIONS: meal prep, cleaning, laundry, driving, shopping, community activity, and occupation  PERSONAL FACTORS: Age, Fitness, and 1 comorbidity: Osteomyelitis L5-S1  are also affecting patient's functional outcome.   REHAB POTENTIAL: Good  CLINICAL DECISION MAKING: Evolving/moderate complexity  EVALUATION COMPLEXITY: Moderate   GOALS: Goals reviewed with patient? yes  SHORT TERM GOALS: Target date: 08/10/2023 Patient will be independent in self management strategies to improve  quality of life and functional outcomes. Baseline: New Program Goal status: INITIAL  2.  Patient will report at least 25 % improvement in overall symptoms and/or function to demonstrate improved functional mobility Baseline: 0% better Goal status: INITIAL  3.  Patient will be able to transition from sit to stand and stand to sit from a normal height chair without the use of her arms Baseline: Unable and very painful Goal status: INITIAL  4.  Patient will be able to walk for 15 minutes with use of assistive device as needed without severe pain to improve ambulatory mobility in the home Baseline: Requires walker 5 to 10 minutes max with severe pain afterwards Goal status: INITIAL    LONG TERM GOALS: Target date: 09/20/2023 Patient will report at least 50 % improvement in overall symptoms and/or function to demonstrate improved functional mobility Baseline: 0% better Goal status: INITIAL  2.  Patient will improve score on FOTO outcomes measure to projected score to demonstrate overall improved function and QOL Baseline: see above Goal status: INITIAL  3.  Patient will be able to walk up to 30 minutes without the use of a rolling walker and without severe pain to improve overall function and return to prior level of function Baseline: 5 to 10 minutes with rolling walker painful Goal status: INITIAL  4.  Patient will be able to sit for at least 8 hours with intermittent standing without severe pain at the end of the dayto be able to perform job tasks Baseline: Currently 5 hours but severe pain  afterwards Goal status: INITIAL   PLAN:  PT FREQUENCY: 2x/week  PT DURATION: 12 weeks  PLANNED INTERVENTIONS: Therapeutic exercises, Therapeutic activity, Neuromuscular re-education, Balance training, Gait training, Patient/Family education, Self Care, Joint mobilization, Joint manipulation, Stair training, Vestibular training, Canalith repositioning, Orthotic/Fit training, Prosthetic  training, DME instructions, Aquatic Therapy, Dry Needling, Electrical stimulation, Spinal manipulation, Spinal mobilization, Cryotherapy, Moist heat, Taping, Traction, Ultrasound, Ionotophoresis 4mg /ml Dexamethasone, Manual therapy, and Re-evaluation.   PLAN FOR NEXT SESSION: no bending/lifting/twisting, gentle isometrics, explain interventions for reassurance and understanding, lx/hip ROM, supine/ gravity eliminated tolerated better   5:30 PM, 07/19/23 Tereasa Coop, DPT Physical Therapy with [facility]

## 2023-07-20 DIAGNOSIS — M4640 Discitis, unspecified, site unspecified: Secondary | ICD-10-CM | POA: Diagnosis not present

## 2023-07-21 DIAGNOSIS — M4640 Discitis, unspecified, site unspecified: Secondary | ICD-10-CM | POA: Diagnosis not present

## 2023-07-22 ENCOUNTER — Ambulatory Visit
Admission: RE | Admit: 2023-07-22 | Discharge: 2023-07-22 | Disposition: A | Discharge status: Home / Self Care | Payer: BC Managed Care – PPO | Source: Ambulatory Visit | Attending: Family Medicine | Admitting: Family Medicine

## 2023-07-22 DIAGNOSIS — M4640 Discitis, unspecified, site unspecified: Secondary | ICD-10-CM | POA: Diagnosis not present

## 2023-07-22 DIAGNOSIS — M4647 Discitis, unspecified, lumbosacral region: Secondary | ICD-10-CM

## 2023-07-22 DIAGNOSIS — M79604 Pain in right leg: Secondary | ICD-10-CM | POA: Diagnosis not present

## 2023-07-22 DIAGNOSIS — M545 Low back pain, unspecified: Secondary | ICD-10-CM | POA: Diagnosis not present

## 2023-07-23 ENCOUNTER — Encounter: Payer: Self-pay | Admitting: Physical Therapy

## 2023-07-23 ENCOUNTER — Ambulatory Visit (INDEPENDENT_AMBULATORY_CARE_PROVIDER_SITE_OTHER): Payer: BC Managed Care – PPO | Admitting: Physical Therapy

## 2023-07-23 DIAGNOSIS — M4640 Discitis, unspecified, site unspecified: Secondary | ICD-10-CM | POA: Diagnosis not present

## 2023-07-23 DIAGNOSIS — M6281 Muscle weakness (generalized): Secondary | ICD-10-CM

## 2023-07-23 DIAGNOSIS — M5459 Other low back pain: Secondary | ICD-10-CM | POA: Diagnosis not present

## 2023-07-23 DIAGNOSIS — R262 Difficulty in walking, not elsewhere classified: Secondary | ICD-10-CM | POA: Diagnosis not present

## 2023-07-23 NOTE — Therapy (Signed)
OUTPATIENT PHYSICAL THERAPY THORACOLUMBAR TREATMENT   Patient Name: Susan [name] MRN: [id number] DOB:1975-10-02, 47 y.o., female Today's Date: 07/23/2023  END OF SESSION:  PT End of Session - 07/23/23 1516     Visit Number 7    Number of Visits 24    Date for PT Re-Evaluation 09/20/23    Authorization Type [employer] 40 VL    Authorization - Number of Visits 40    Progress Note Due on Visit 10    PT Start Time 1516    PT Stop Time 1558    PT Time Calculation (min) 42 min    Activity Tolerance Patient tolerated treatment well    Behavior During Therapy Restless;Anxious               Past Medical History:  Diagnosis Date   Abnormal pap 02/2003   CIN 2-3   Basal cell carcinoma of back 07/22/2021   GERD (gastroesophageal reflux disease)    Hyperlipidemia    Migraine with aura    Past Surgical History:  Procedure Laterality Date   BREAST BIOPSY Left 08/04/2022   [place] LT BREAST BX W LOC DEV 1ST LESION IMG BX SPEC US GUIDE 08/04/2022 [facility]   CERVICAL BIOPSY  W/ LOOP ELECTRODE EXCISION  02/2003   CIN 2-3   CHOLECYSTECTOMY  2015   COSMETIC SURGERY  2009   TEE WITHOUT CARDIOVERSION N/A 06/18/2023   Procedure: TRANSESOPHAGEAL ECHOCARDIOGRAM;  Surgeon: Wendall Stade, MD;  Location: [facility];  Service: Cardiovascular;  Laterality: N/A;   Patient Active Problem List   Diagnosis Date Noted   Malnutrition of moderate degree 06/19/2023   Staphylococcus aureus infection 06/19/2023   Bacteremia with signs of infection 06/18/2023   Discitis 06/15/2023   Right lateral epicondylitis 04/12/2023   Plantar fasciitis of left foot 08/17/2022   Ascending aorta dilatation -- found on CT Sep 2023 --> Recheck CT in Sep 2024 05/31/2022   Chest wall pain 04/20/2022   Traumatic hematoma of lower leg, left, initial encounter 11/03/2021   Basal cell carcinoma of back 07/22/2021   Dermatofibroma 07/22/2021   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   History of malignant  neoplasm of skin 07/22/2021   Lentigo 07/22/2021   Melanocytic nevi of trunk 07/22/2021   Neoplasm of uncertain behavior of skin 07/22/2021   Other skin changes due to chronic exposure to nonionizing radiation 07/22/2021   Skin tag 07/22/2021   Low back pain 08/03/2020   Nonallopathic lesion of sacral region 08/03/2020   Nonallopathic lesion of thoracic region 08/03/2020   Nonallopathic lesion of lumbar region 08/03/2020   Class 1 obesity due to excess calories without serious comorbidity with body mass index (BMI) of 33.0 to 33.9 in adult 11/17/2017   Hypercholesterolemia, not on statin 08/18/2017   H/O abnormal cervical Papanicolaou smear 01/06/2013    PCP: Jarold Motto, PA   REFERRING PROVIDER: Jarold Motto, PA   REFERRING DIAG: [phone] (ICD-10-CM) - Discitis of lumbosacral region   Rationale for Evaluation and Treatment: Rehabilitation  THERAPY DIAG:  Other low back pain  Difficulty in walking, not elsewhere classified  Muscle weakness (generalized)  ONSET DATE: 05/29/23 fall with subsequent osteomyelitis L5/S1  SUBJECTIVE:  SUBJECTIVE STATEMENT: 07/23/2023  The sciatic pain is off the chain, it was terrible this weekend and it feels like its getting worse. I can feel it in my leg and then its white fire in the front of my right shin. Mornings are torture and then once I get moving around but that is the catch 22. Have a standing desk at work now, working 1/2 days now. I ended up laying on the conference table at work to stretch out, accidentally rolled onto R side and hit a bad spot in my rear and my pain has been flared ever since. Bending over is the worst, I try to avoid it but sometimes I have to do it.   Eval: Patient presents with husband and is standing during evaluation  secondary to high levels of pain and concern.  Patient unsure if she can even lay down on table for fear she would not be able to get back up due to pain  Patient for started having pain on 9 2 of this year and her left hip where she saw a doctor who gave her some medications.  She then went abroad to [place] where she fell during a hike and went to a physician overseas.  He gave her an injection of medications.  When she came back she saw another doctor as she was having more pain and MRI demonstrated osteomyelitis at L5-S1. She was instructed to go to the ER and was subsequently admitted to hospital from 06/15/23 - 06/20/23 for discitis, bacteremia, staphylococcus aureus infection. Given IV antibiotics. Repeat blood cultures negative.  Patient was taking gabapentin, naproxen, ibuprofen and oxy but has run out of [place] and her GABA prescription is almost up currently she is in severe pain and concerned for her quality of life once her GABA prescription runs up.  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  She is currently having severe pain with all activities movements and can barely get comfortable side-lying is most comfortable but she tosses and turns she has not been on her stomach.  It feels like she gets stuck after sitting for a while and cannot get upright and everything just locks up on her.  She has never had pain like this before.  Patient also reports that her middle finger was also possibly injured on the right hand during a transfer at the hospital it is swollen and painful.  Her right knee is also bothering her and she has noticed numbness in her right great toe it used to be in her heel but now is primarily just in her great toe.  PERTINENT HISTORY:  Migraines, GERD  PAIN:  Are you having pain? Yes: NPRS scale: 3/10 Pain location: right hip dull ache radiating down leg and into calf Pain description: Sharp achy throbbing unbearable Aggravating factors: Movement sitting  transitional movements Relieving factors: Medication  PRECAUTIONS: Fall  RED FLAGS: None   WEIGHT BEARING RESTRICTIONS: No  FALLS:  Has patient fallen in last 6 months? Yes. 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  DIAGNOSTIC FINDINGS:  Lumbar MRI 06/15/2023    IMPRESSION: 1. Positive for Discitis Osteomyelitis at L5-S1. Pronounced epidural inflammation there, and small sacral epidural abscess tracking within the right posterior and lateral sacral epidural space from S1 through at least S3, involving the exiting right sacral nerve roots. Subsequent mild to moderate L5-S1 spinal stenosis, in part due to dural thickening and enhancement. And bilateral L5 neural foraminal stenosis and foraminal inflammation.   2. No paraspinal muscle abscess.  No other lumbar levels involved.    PATIENT SURVEYS:  FOTO 9%  SCREENING FOR RED FLAGS: Bowel or bladder incontinence: No Spinal tumors: No Cauda equina syndrome: No Compression fracture: No Abdominal aneurysm: No  COGNITION: Overall cognitive status: Within functional limits for tasks assessed     SENSATION: Not tested    POSTURE: Slumped to forward posture with heavy use hands on rolling walker  PALPATION: No tenderness to palpation noted in lumbar musculature or glutes  LUMBAR ROM: Unable to formally test secondary to high levels of pain  AROM   Flexion   Extension   Right lateral flexion   Left lateral flexion   Right rotation   Left rotation    (Blank rows = not  tested)    LE Measurements unable to formally test secondary to high levels of pain Lower Extremity Right  Left    A/PROM MMT A/PROM MMT  Hip Flexion      Hip Extension      Hip Abduction      Hip Adduction      Hip Internal rotation      Hip External rotation      Knee Flexion      Knee Extension      Ankle Dorsiflexion      Ankle Plantarflexion      Ankle Inversion      Ankle Eversion       (Blank rows = not tested) * pain'   LUMBAR SPECIAL TESTS:  Unable to test secondary to high levels of pain FUNCTIONAL TESTS:  Sit to stand: Slow labored movements patient holding breath with increased pain strong use of arms. Unable to perform any bed mobilities today secondary to pain, fear of pain and fear of not being able to get up Stand to sit 3 attempts with elevated height of chair and strong use of her arms very painful slow-labored movements  GAIT: Distance walked: 25 feet in clinic Assistive device utilized: Walker - 2 wheeled Level of assistance: SBA Comments: Labored gait with a rolling walker, excessive weight on upper extremities, wide base of support, antalgic  TODAY'S TREATMENT:                                                                                                                              DATE:   07/23/2023  Therapeutic Exercise: Supine:  R piriformis and HS stretches 2x30 seconds each  Standing: REIS x10- mild change in  intensity of pain, lumbar extensions at the wall x10   Prone:    Seated:   Standing: Neuromuscular Re-education: Manual Therapy: sciatic flossing x5 rounds supine, IASTM with tennis ball R piriformis, SIJ MET x6 Therapeutic Activity:   Self Care: Trigger Point Dry Needling:  Modalities:           PATIENT EDUCATION:  Education details: on HEP and rationale for interventions Person educated: Patient Education method: Explanation, Demonstration, and Handouts Education comprehension: verbalized understanding   HOME  EXERCISE PROGRAM: [id number]  ASSESSMENT:  CLINICAL IMPRESSION: 07/23/2023  Pt arrives today with severe ongoing pain. Trialed REIS with mild improvement in pain, also worked on some interventions for sciatic pain today. Otherwise focused on functional strengthening and stretching as tolerated. Will continue to monitor pain closely and adjust interventions PRN. Able to improve pain to 0/10 with movement at EOS.    Eval: Patient presents to physical therapy with complaints of severe low back pain after a fall resulting in osteomyelitis of L5-S1.  Patient has been treated with a course of antibiotics in the hospital and blood work has come back negative.  She presents with use of rolling walker and severe pain with all movements and transitional movements.  Session limited on objective information today secondary to high levels of pain.  Primary goal of today's session was to reassure patient and her family and give pain management strategies to help cope with the current pain levels.  Patient is greatly limited in function due to pain in range of motion and strength deficits and would greatly benefit from skilled PT at this time.  OBJECTIVE IMPAIRMENTS: Abnormal gait, decreased activity tolerance, decreased balance, decreased coordination, decreased endurance, decreased knowledge of use of DME, decreased mobility, difficulty walking, decreased ROM, decreased strength, improper body mechanics, postural dysfunction, and pain.   ACTIVITY LIMITATIONS: carrying, lifting, bending, sitting, standing, squatting, sleeping, stairs, transfers, bed mobility, bathing, toileting, dressing, hygiene/grooming, and locomotion level  PARTICIPATION LIMITATIONS: meal prep, cleaning, laundry, driving, shopping, community activity, and occupation  PERSONAL FACTORS: Age, Fitness, and 1 comorbidity: Osteomyelitis L5-S1  are also affecting patient's functional outcome.   REHAB POTENTIAL: Good  CLINICAL DECISION MAKING:  Evolving/moderate complexity  EVALUATION COMPLEXITY: Moderate   GOALS: Goals reviewed with patient? yes  SHORT TERM GOALS: Target date: 08/10/2023 Patient will be independent in self management strategies to improve quality of life and functional outcomes. Baseline: New Program Goal status: INITIAL  2.  Patient will report at least 25 % improvement in overall symptoms and/or function to demonstrate improved functional mobility Baseline: 0% better Goal status: INITIAL  3.  Patient will be able to transition from sit to stand and stand to sit from a normal height chair without the use of her arms Baseline: Unable and very painful Goal status: INITIAL  4.  Patient will be able to walk for 15 minutes with use of assistive device as needed without severe pain to improve ambulatory mobility in the home Baseline: Requires walker 5 to 10 minutes max with severe pain afterwards Goal status: INITIAL    LONG TERM GOALS: Target date: 09/20/2023 Patient will report at least 50 % improvement in overall symptoms and/or function to demonstrate improved functional mobility Baseline: 0% better Goal status: INITIAL  2.  Patient will improve score on FOTO outcomes measure to projected score to demonstrate overall improved function and QOL Baseline: see above Goal status: INITIAL  3.  Patient will be able to walk up to 30 minutes without the use of  a rolling walker and without severe pain to improve overall function and return to prior level of function Baseline: 5 to 10 minutes with rolling walker painful Goal status: INITIAL  4.  Patient will be able to sit for at least 8 hours with intermittent standing without severe pain at the end of the dayto be able to perform job tasks Baseline: Currently 5 hours but severe pain afterwards Goal status: INITIAL   PLAN:  PT FREQUENCY: 2x/week  PT DURATION: 12 weeks  PLANNED INTERVENTIONS: Therapeutic exercises, Therapeutic activity, Neuromuscular  re-education, Balance training, Gait training, Patient/Family education, Self Care, Joint mobilization, Joint manipulation, Stair training, Vestibular training, Canalith repositioning, Orthotic/Fit training, Prosthetic training, DME instructions, Aquatic Therapy, Dry Needling, Electrical stimulation, Spinal manipulation, Spinal mobilization, Cryotherapy, Moist heat, Taping, Traction, Ultrasound, Ionotophoresis 4mg /ml Dexamethasone, Manual therapy, and Re-evaluation.   PLAN FOR NEXT SESSION: no bending/lifting/twisting, gentle isometrics, explain interventions for reassurance and understanding, lx/hip ROM, supine/ gravity eliminated tolerated better. How are sx?   Nedra Hai, PT, DPT 07/23/23 3:59 PM

## 2023-07-24 DIAGNOSIS — M4640 Discitis, unspecified, site unspecified: Secondary | ICD-10-CM | POA: Diagnosis not present

## 2023-07-25 ENCOUNTER — Ambulatory Visit (INDEPENDENT_AMBULATORY_CARE_PROVIDER_SITE_OTHER): Payer: BC Managed Care – PPO | Admitting: Physical Therapy

## 2023-07-25 ENCOUNTER — Encounter: Payer: Self-pay | Admitting: Physical Therapy

## 2023-07-25 DIAGNOSIS — R262 Difficulty in walking, not elsewhere classified: Secondary | ICD-10-CM

## 2023-07-25 DIAGNOSIS — M5459 Other low back pain: Secondary | ICD-10-CM | POA: Diagnosis not present

## 2023-07-25 DIAGNOSIS — M6281 Muscle weakness (generalized): Secondary | ICD-10-CM | POA: Diagnosis not present

## 2023-07-25 DIAGNOSIS — M4640 Discitis, unspecified, site unspecified: Secondary | ICD-10-CM | POA: Diagnosis not present

## 2023-07-25 DIAGNOSIS — M4626 Osteomyelitis of vertebra, lumbar region: Secondary | ICD-10-CM | POA: Diagnosis not present

## 2023-07-25 NOTE — Therapy (Signed)
OUTPATIENT PHYSICAL THERAPY THORACOLUMBAR TREATMENT   Patient Name: Susan [name] MRN: [id number] DOB:03/21/1976, 47 y.o., female Today's Date: 07/25/2023  END OF SESSION:  PT End of Session - 07/25/23 1558     Visit Number 8    Number of Visits 24    Date for PT Re-Evaluation 09/20/23    Authorization Type [employer] [id number] VL    Progress Note Due on Visit 10    PT Start Time 1521    PT Stop Time 1559    PT Time Calculation (min) 38 min    Activity Tolerance Patient tolerated treatment well    Behavior During Therapy WFL for tasks assessed/performed                Past Medical History:  Diagnosis Date   Abnormal pap 02/2003   CIN 2-3   Basal cell carcinoma of back 07/22/2021   GERD (gastroesophageal reflux disease)    Hyperlipidemia    Migraine with aura    Past Surgical History:  Procedure Laterality Date   BREAST BIOPSY Left 08/04/2022   [place] LT BREAST BX W LOC DEV 1ST LESION IMG BX SPEC US GUIDE 08/04/2022 [facility]   CERVICAL BIOPSY  W/ LOOP ELECTRODE EXCISION  02/2003   CIN 2-3   CHOLECYSTECTOMY  2015   COSMETIC SURGERY  2009   TEE WITHOUT CARDIOVERSION N/A 06/18/2023   Procedure: TRANSESOPHAGEAL ECHOCARDIOGRAM;  Surgeon: Wendall Stade, MD;  Location: [facility];  Service: Cardiovascular;  Laterality: N/A;   Patient Active Problem List   Diagnosis Date Noted   Malnutrition of moderate degree 06/19/2023   Staphylococcus aureus infection 06/19/2023   Bacteremia with signs of infection 06/18/2023   Discitis 06/15/2023   Right lateral epicondylitis 04/12/2023   Plantar fasciitis of left foot 08/17/2022   Ascending aorta dilatation -- found on CT Sep 2023 --> Recheck CT in Sep 2024 05/31/2022   Chest wall pain 04/20/2022   Traumatic hematoma of lower leg, left, initial encounter 11/03/2021   Basal cell carcinoma of back 07/22/2021   Dermatofibroma 07/22/2021   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   History of malignant neoplasm of skin 07/22/2021    Lentigo 07/22/2021   Melanocytic nevi of trunk 07/22/2021   Neoplasm of uncertain behavior of skin 07/22/2021   Other skin changes due to chronic exposure to nonionizing radiation 07/22/2021   Skin tag 07/22/2021   Low back pain 08/03/2020   Nonallopathic lesion of sacral region 08/03/2020   Nonallopathic lesion of thoracic region 08/03/2020   Nonallopathic lesion of lumbar region 08/03/2020   Class 1 obesity due to excess calories without serious comorbidity with body mass index (BMI) of 33.0 to 33.9 in adult 11/17/2017   Hypercholesterolemia, not on statin 08/18/2017   H/O abnormal cervical Papanicolaou smear 01/06/2013    PCP: Jarold Motto, PA   REFERRING PROVIDER: Jarold Motto, PA   REFERRING DIAG: [phone] (ICD-10-CM) - Discitis of lumbosacral region   Rationale for Evaluation and Treatment: Rehabilitation  THERAPY DIAG:  Other low back pain  Difficulty in walking, not elsewhere classified  Muscle weakness (generalized)  ONSET DATE: 05/29/23 fall with subsequent osteomyelitis L5/S1  SUBJECTIVE:  SUBJECTIVE STATEMENT: 07/25/2023  I feel pretty good today, but yesterday morning was the worst its been when I first got up. Felt better after moving around yesterday, today is going well, had calf symptoms for about 10 seconds this morning only. I was sore in my hip Tuesday morning in the area where you were pressing but it felt really good at the time.   Eval: Patient presents with husband and is standing during evaluation secondary to high levels of pain and concern.  Patient unsure if she can even lay down on table for fear she would not be able to get back up due to pain  Patient for started having pain on 9 2 of this year and her left hip where she saw a doctor who gave her some  medications.  She then went abroad to [place] where she fell during a hike and went to a physician overseas.  He gave her an injection of medications.  When she came back she saw another doctor as she was having more pain and MRI demonstrated osteomyelitis at L5-S1. She was instructed to go to the ER and was subsequently admitted to hospital from 06/15/23 - 06/20/23 for discitis, bacteremia, staphylococcus aureus infection. Given IV antibiotics. Repeat blood cultures negative.  Patient was taking gabapentin, naproxen, ibuprofen and oxy but has run out of [place] and her GABA prescription is almost up currently she is in severe pain and concerned for her quality of life once her GABA prescription runs up.  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  She is currently having severe pain with all activities movements and can barely get comfortable side-lying is most comfortable but she tosses and turns she has not been on her stomach.  It feels like she gets stuck after sitting for a while and cannot get upright and everything just locks up on her.  She has never had pain like this before.  Patient also reports that her middle finger was also possibly injured on the right hand during a transfer at the hospital it is swollen and painful.  Her right knee is also bothering her and she has noticed numbness in her right great toe it used to be in her heel but now is primarily just in her great toe.  PERTINENT HISTORY:  Migraines, GERD  PAIN:  Are you having pain? Yes: NPRS scale: 1/10 Pain location: back and R calf  Pain description: aching  Aggravating factors: Movement sitting transitional movements Relieving factors: Medication  PRECAUTIONS: Fall  RED FLAGS: None   WEIGHT BEARING RESTRICTIONS: No  FALLS:  Has patient fallen in last 6 months? Yes. 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  DIAGNOSTIC FINDINGS:  Lumbar MRI 06/15/2023    IMPRESSION: 1. Positive for Discitis Osteomyelitis at L5-S1. Pronounced epidural inflammation there, and small sacral epidural abscess tracking within the right posterior and lateral sacral epidural space from S1 through at least S3, involving the exiting right sacral nerve roots. Subsequent mild to moderate L5-S1 spinal stenosis, in part  due to dural thickening and enhancement. And bilateral L5 neural foraminal stenosis and foraminal inflammation.   2. No paraspinal muscle abscess.  No other lumbar levels involved.    PATIENT SURVEYS:  FOTO 9%  SCREENING FOR RED FLAGS: Bowel or bladder incontinence: No Spinal tumors: No Cauda equina syndrome: No Compression fracture: No Abdominal aneurysm: No  COGNITION: Overall cognitive status: Within functional limits for tasks assessed     SENSATION: Not tested    POSTURE: Slumped to forward posture with heavy use hands on rolling walker  PALPATION: No tenderness to palpation noted in lumbar musculature or glutes  LUMBAR ROM: Unable to formally test secondary to high levels of pain  AROM   Flexion   Extension   Right lateral flexion   Left lateral flexion   Right rotation   Left rotation    (Blank rows = not tested)    LE Measurements unable to formally test secondary to high levels of pain Lower Extremity Right  Left    A/PROM MMT A/PROM MMT  Hip Flexion      Hip Extension      Hip Abduction      Hip Adduction      Hip Internal rotation      Hip External rotation      Knee Flexion      Knee Extension      Ankle Dorsiflexion       Ankle Plantarflexion      Ankle Inversion      Ankle Eversion       (Blank rows = not tested) * pain'   LUMBAR SPECIAL TESTS:  Unable to test secondary to high levels of pain FUNCTIONAL TESTS:  Sit to stand: Slow labored movements patient holding breath with increased pain strong use of arms. Unable to perform any bed mobilities today secondary to pain, fear of pain and fear of not being able to get up Stand to sit 3 attempts with elevated height of chair and strong use of her arms very painful slow-labored movements  GAIT: Distance walked: 25 feet in clinic Assistive device utilized: Walker - 2 wheeled Level of assistance: SBA Comments: Labored gait with a rolling walker, excessive weight on upper extremities, wide base of support, antalgic  TODAY'S TREATMENT:                                                                                                                              DATE:   07/25/2023   Therapeutic Exercise: Supine:  R piriformis stretch 3x60 seconds, TA sets 12x3 second holds, PPT + lumbar arching x12 with 3 second holds, isometric hip extension against mat table 2x5 B 3 second holds, PPT + mini march 2x5  Standing: lumbar extensions (REIS) against wall x15,    Prone:    Seated: trailed use of lumbar support/towel roll to maintain better lumbar alignment/reduce calf sx in sitting tolerated for 5 min without significant calf symptoms (significant improvement  of 1-2 minute baseline without support)  Standing: Neuromuscular Re-education: Manual Therapy:   Therapeutic Activity:   Self Care: Trigger Point Dry Needling:  Modalities:                PATIENT EDUCATION:  Education details: on HEP and rationale for interventions Person educated: Patient Education method: Explanation, Demonstration, and Handouts Education comprehension: verbalized understanding   HOME EXERCISE PROGRAM: [id number]  ASSESSMENT:  CLINICAL  IMPRESSION: 07/25/2023  Pt arrives today with improved pain, but the morning after last PT session was more painful before pain improved again. Feeling pretty good today. Trailed lumbar support with extended sitting today with no calf symptoms noted after 5 minutes, encouraged trial of this at work and home. Otherwise focused on lumbar extension programming as this has been helpful in addressing calf symptoms. Continued to encourage BLT precautions to prevent excessive stress on spine at this point.   Eval: Patient presents to physical therapy with complaints of severe low back pain after a fall resulting in osteomyelitis of L5-S1.  Patient has been treated with a course of antibiotics in the hospital and blood work has come back negative.  She presents with use of rolling walker and severe pain with all movements and transitional movements.  Session limited on objective information today secondary to high levels of pain.  Primary goal of today's session was to reassure patient and her family and give pain management strategies to help cope with the current pain levels.  Patient is greatly limited in function due to pain in range of motion and strength deficits and would greatly benefit from skilled PT at this time.  OBJECTIVE IMPAIRMENTS: Abnormal gait, decreased activity tolerance, decreased balance, decreased coordination, decreased endurance, decreased knowledge of use of DME, decreased mobility, difficulty walking, decreased ROM, decreased strength, improper body mechanics, postural dysfunction, and pain.   ACTIVITY LIMITATIONS: carrying, lifting, bending, sitting, standing, squatting, sleeping, stairs, transfers, bed mobility, bathing, toileting, dressing, hygiene/grooming, and locomotion level  PARTICIPATION LIMITATIONS: meal prep, cleaning, laundry, driving, shopping, community activity, and occupation  PERSONAL FACTORS: Age, Fitness, and 1 comorbidity: Osteomyelitis L5-S1  are also affecting  patient's functional outcome.   REHAB POTENTIAL: Good  CLINICAL DECISION MAKING: Evolving/moderate complexity  EVALUATION COMPLEXITY: Moderate   GOALS: Goals reviewed with patient? yes  SHORT TERM GOALS: Target date: 08/10/2023 Patient will be independent in self management strategies to improve quality of life and functional outcomes. Baseline: New Program Goal status: INITIAL  2.  Patient will report at least 25 % improvement in overall symptoms and/or function to demonstrate improved functional mobility Baseline: 0% better Goal status: INITIAL  3.  Patient will be able to transition from sit to stand and stand to sit from a normal height chair without the use of her arms Baseline: Unable and very painful Goal status: INITIAL  4.  Patient will be able to walk for 15 minutes with use of assistive device as needed without severe pain to improve ambulatory mobility in the home Baseline: Requires walker 5 to 10 minutes max with severe pain afterwards Goal status: INITIAL    LONG TERM GOALS: Target date: 09/20/2023 Patient will report at least 50 % improvement in overall symptoms and/or function to demonstrate improved functional mobility Baseline: 0% better Goal status: INITIAL  2.  Patient will improve score on FOTO outcomes measure to projected score to demonstrate overall improved function and QOL Baseline: see above Goal status: INITIAL  3.  Patient will be able to walk up to 30  minutes without the use of a rolling walker and without severe pain to improve overall function and return to prior level of function Baseline: 5 to 10 minutes with rolling walker painful Goal status: INITIAL  4.  Patient will be able to sit for at least 8 hours with intermittent standing without severe pain at the end of the dayto be able to perform job tasks Baseline: Currently 5 hours but severe pain afterwards Goal status: INITIAL   PLAN:  PT FREQUENCY: 2x/week  PT DURATION: 12  weeks  PLANNED INTERVENTIONS: Therapeutic exercises, Therapeutic activity, Neuromuscular re-education, Balance training, Gait training, Patient/Family education, Self Care, Joint mobilization, Joint manipulation, Stair training, Vestibular training, Canalith repositioning, Orthotic/Fit training, Prosthetic training, DME instructions, Aquatic Therapy, Dry Needling, Electrical stimulation, Spinal manipulation, Spinal mobilization, Cryotherapy, Moist heat, Taping, Traction, Ultrasound, Ionotophoresis 4mg /ml Dexamethasone, Manual therapy, and Re-evaluation.   PLAN FOR NEXT SESSION: no bending/lifting/twisting, gentle isometrics, explain interventions for reassurance and understanding, lx/hip ROM, supine/ gravity eliminated tolerated better. How are sx, did the towel roll support help prevent calf sx?    Nedra Hai, PT, DPT 07/25/23 4:04 PM

## 2023-07-26 ENCOUNTER — Ambulatory Visit (HOSPITAL_BASED_OUTPATIENT_CLINIC_OR_DEPARTMENT_OTHER)
Admission: RE | Admit: 2023-07-26 | Discharge: 2023-07-26 | Disposition: A | Discharge status: Home / Self Care | Payer: BC Managed Care – PPO | Source: Ambulatory Visit | Attending: Family Medicine | Admitting: Family Medicine

## 2023-07-26 DIAGNOSIS — M4640 Discitis, unspecified, site unspecified: Secondary | ICD-10-CM | POA: Diagnosis not present

## 2023-07-26 DIAGNOSIS — Z1231 Encounter for screening mammogram for malignant neoplasm of breast: Secondary | ICD-10-CM | POA: Diagnosis not present

## 2023-07-27 DIAGNOSIS — M4640 Discitis, unspecified, site unspecified: Secondary | ICD-10-CM | POA: Diagnosis not present

## 2023-07-28 ENCOUNTER — Inpatient Hospital Stay (HOSPITAL_BASED_OUTPATIENT_CLINIC_OR_DEPARTMENT_OTHER): Admission: RE | Admit: 2023-07-28 | Payer: BC Managed Care – PPO | Source: Ambulatory Visit | Admitting: Radiology

## 2023-07-28 DIAGNOSIS — M4640 Discitis, unspecified, site unspecified: Secondary | ICD-10-CM | POA: Diagnosis not present

## 2023-07-29 DIAGNOSIS — M4640 Discitis, unspecified, site unspecified: Secondary | ICD-10-CM | POA: Diagnosis not present

## 2023-07-30 ENCOUNTER — Encounter: Payer: Self-pay | Admitting: Physical Therapy

## 2023-07-30 ENCOUNTER — Encounter: Payer: Self-pay | Admitting: Family Medicine

## 2023-07-30 ENCOUNTER — Ambulatory Visit (INDEPENDENT_AMBULATORY_CARE_PROVIDER_SITE_OTHER): Payer: BC Managed Care – PPO | Admitting: Physical Therapy

## 2023-07-30 ENCOUNTER — Other Ambulatory Visit: Payer: Self-pay | Admitting: Physician Assistant

## 2023-07-30 DIAGNOSIS — M5459 Other low back pain: Secondary | ICD-10-CM | POA: Diagnosis not present

## 2023-07-30 DIAGNOSIS — R262 Difficulty in walking, not elsewhere classified: Secondary | ICD-10-CM | POA: Diagnosis not present

## 2023-07-30 DIAGNOSIS — M4640 Discitis, unspecified, site unspecified: Secondary | ICD-10-CM | POA: Diagnosis not present

## 2023-07-30 DIAGNOSIS — M6281 Muscle weakness (generalized): Secondary | ICD-10-CM

## 2023-07-30 NOTE — Therapy (Signed)
OUTPATIENT PHYSICAL THERAPY THORACOLUMBAR TREATMENT   Patient Name: Susan [name] MRN: [id number] DOB:10/25/1975, 47 y.o., female Today's Date: 07/30/2023  END OF SESSION:  PT End of Session - 07/30/23 1345     Visit Number 9    Number of Visits 24    Date for PT Re-Evaluation 09/20/23    Authorization Type [employer] 40 VL    Authorization - Visit Number 9    Authorization - Number of Visits 40    Progress Note Due on Visit 10    PT Start Time 1346    PT Stop Time 1426    PT Time Calculation (min) 40 min    Activity Tolerance Patient tolerated treatment well    Behavior During Therapy WFL for tasks assessed/performed                Past Medical History:  Diagnosis Date   Abnormal pap 02/2003   CIN 2-3   Basal cell carcinoma of back 07/22/2021   GERD (gastroesophageal reflux disease)    Hyperlipidemia    Migraine with aura    Past Surgical History:  Procedure Laterality Date   BREAST BIOPSY Left 08/04/2022   [place] LT BREAST BX W LOC DEV 1ST LESION IMG BX SPEC US GUIDE 08/04/2022 [facility]   CERVICAL BIOPSY  W/ LOOP ELECTRODE EXCISION  02/2003   CIN 2-3   CHOLECYSTECTOMY  2015   COSMETIC SURGERY  2009   TEE WITHOUT CARDIOVERSION N/A 06/18/2023   Procedure: TRANSESOPHAGEAL ECHOCARDIOGRAM;  Surgeon: Wendall Stade, MD;  Location: [facility];  Service: Cardiovascular;  Laterality: N/A;   Patient Active Problem List   Diagnosis Date Noted   Malnutrition of moderate degree 06/19/2023   Staphylococcus aureus infection 06/19/2023   Bacteremia with signs of infection 06/18/2023   Discitis 06/15/2023   Right lateral epicondylitis 04/12/2023   Plantar fasciitis of left foot 08/17/2022   Ascending aorta dilatation -- found on CT Sep 2023 --> Recheck CT in Sep 2024 05/31/2022   Chest wall pain 04/20/2022   Traumatic hematoma of lower leg, left, initial encounter 11/03/2021   Basal cell carcinoma of back 07/22/2021   Dermatofibroma 07/22/2021    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   History of malignant neoplasm of skin 07/22/2021   Lentigo 07/22/2021   Melanocytic nevi of trunk 07/22/2021   Neoplasm of uncertain behavior of skin 07/22/2021   Other skin changes due to chronic exposure to nonionizing radiation 07/22/2021   Skin tag 07/22/2021   Low back pain 08/03/2020   Nonallopathic lesion of sacral region 08/03/2020   Nonallopathic lesion of thoracic region 08/03/2020   Nonallopathic lesion of lumbar region 08/03/2020   Class 1 obesity due to excess calories without serious comorbidity with body mass index (BMI) of 33.0 to 33.9 in adult 11/17/2017   Hypercholesterolemia, not on statin 08/18/2017   H/O abnormal cervical Papanicolaou smear 01/06/2013    PCP: Jarold Motto, PA   REFERRING PROVIDER: Jarold Motto, PA   REFERRING DIAG: [phone] (ICD-10-CM) - Discitis of lumbosacral region   Rationale for Evaluation and Treatment: Rehabilitation  THERAPY DIAG:  Other low back pain  Difficulty in walking, not elsewhere classified  Muscle weakness (generalized)  ONSET DATE: 05/29/23 fall with subsequent osteomyelitis L5/S1  SUBJECTIVE:  SUBJECTIVE STATEMENT: 07/30/2023 No sciatica over the last few days. States this morning she has had some weakness and soreness. States she still has a little bit of throbbing in her calf. Back and hips have been a littl sore over the weekend.   Eval: Patient presents with husband and is standing during evaluation secondary to high levels of pain and concern.  Patient unsure if she can even lay down on table for fear she would not be able to get back up due to pain  Patient for started having pain on 9 2 of this year and her left hip where she saw a doctor who gave her some medications.  She then went abroad to  [place] where she fell during a hike and went to a physician overseas.  He gave her an injection of medications.  When she came back she saw another doctor as she was having more pain and MRI demonstrated osteomyelitis at L5-S1. She was instructed to go to the ER and was subsequently admitted to hospital from 06/15/23 - 06/20/23 for discitis, bacteremia, staphylococcus aureus infection. Given IV antibiotics. Repeat blood cultures negative.  Patient was taking gabapentin, naproxen, ibuprofen and oxy but has run out of [place] and her GABA prescription is almost up currently she is in severe pain and concerned for her quality of life once her GABA prescription runs up.  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  She is currently having severe pain with all activities movements and can barely get comfortable side-lying is most comfortable but she tosses and turns she has not been on her stomach.  It feels like she gets stuck after sitting for a while and cannot get upright and everything just locks up on her.  She has never had pain like this before.  Patient also reports that her middle finger was also possibly injured on the right hand during a transfer at the hospital it is swollen and painful.  Her right knee is also bothering her and she has noticed numbness in her right great toe it used to be in her heel but now is primarily just in her great toe.  PERTINENT HISTORY:  Migraines, GERD  PAIN:  Are you having pain? Yes: NPRS scale: 3/10 Pain location: back and R calf  Pain description: aching and throbbing Aggravating factors: Movement sitting transitional movements Relieving factors: Medication  PRECAUTIONS: Fall  RED FLAGS: None   WEIGHT BEARING RESTRICTIONS: No  FALLS:  Has patient fallen in last 6 months? Yes. 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  DIAGNOSTIC FINDINGS:  Lumbar MRI 06/15/2023    IMPRESSION: 1. Positive for Discitis Osteomyelitis at L5-S1. Pronounced epidural inflammation there, and small sacral epidural abscess tracking within the right posterior and lateral sacral epidural space from S1 through at least S3, involving the exiting right sacral nerve roots. Subsequent mild to moderate L5-S1 spinal stenosis, in part due to dural thickening and enhancement. And bilateral L5 neural foraminal stenosis and foraminal inflammation.   2. No paraspinal muscle  abscess.  No other lumbar levels involved.    PATIENT SURVEYS:  FOTO 9%  SCREENING FOR RED FLAGS: Bowel or bladder incontinence: No Spinal tumors: No Cauda equina syndrome: No Compression fracture: No Abdominal aneurysm: No  COGNITION: Overall cognitive status: Within functional limits for tasks assessed     SENSATION: Not tested    POSTURE: Slumped to forward posture with heavy use hands on rolling walker  PALPATION: No tenderness to palpation noted in lumbar musculature or glutes  LUMBAR ROM: Unable to formally test secondary to high levels of pain  AROM   Flexion   Extension   Right lateral flexion   Left lateral flexion   Right rotation   Left rotation    (Blank rows = not tested)    LE Measurements unable to formally test secondary to high levels of pain Lower Extremity Right  Left    A/PROM MMT A/PROM MMT  Hip Flexion      Hip Extension      Hip Abduction      Hip Adduction      Hip Internal rotation      Hip External rotation      Knee Flexion      Knee Extension      Ankle Dorsiflexion      Ankle Plantarflexion       Ankle Inversion      Ankle Eversion       (Blank rows = not tested) * pain'   LUMBAR SPECIAL TESTS:  Unable to test secondary to high levels of pain FUNCTIONAL TESTS:  Sit to stand: Slow labored movements patient holding breath with increased pain strong use of arms. Unable to perform any bed mobilities today secondary to pain, fear of pain and fear of not being able to get up Stand to sit 3 attempts with elevated height of chair and strong use of her arms very painful slow-labored movements  GAIT: Distance walked: 25 feet in clinic Assistive device utilized: Walker - 2 wheeled Level of assistance: SBA Comments: Labored gait with a rolling walker, excessive weight on upper extremities, wide base of support, antalgic  TODAY'S TREATMENT:                                                                                                                              DATE:   07/30/2023  Therapeutic Exercise: Supine: LTR - intermittent between bridges, bridges with hands/feet/breath focus - 12x1 - rest after each one, SLR with quad set 10 minutes total     Prone:    Seated:  Standing: Neuromuscular Re-education: Manual Therapy:   Therapeutic Activity:   Self Care: Trigger Point Dry Needling:  Modalities:     PATIENT EDUCATION:  Education details: on HEP and rationale for interventions, importance of glute activation and anticipated DOMs  Person educated: Patient Education method: Explanation, Demonstration, and Handouts Education comprehension: verbalized understanding   HOME EXERCISE PROGRAM: [id number]  ASSESSMENT:  CLINICAL IMPRESSION:  07/30/2023 Reviewed and added bridges on this date. Able to perform one at a time with LTR in between, no pain but significant fatigue after each rep. Added new exercises to HEP and discussed soft tissue work/stretches to help with post workout soreness and reduce DOMS. Will continue with current POC as tolerated.    Eval: Patient  presents to physical therapy with complaints of severe low back pain after a fall resulting in osteomyelitis of L5-S1.  Patient has been treated with a course of antibiotics in the hospital and blood work has come back negative.  She presents with use of rolling walker and severe pain with all movements and transitional movements.  Session limited on objective information today secondary to high levels of pain.  Primary goal of today's session was to reassure patient and her family and give pain management strategies to help cope with the current pain levels.  Patient is greatly limited in function due to pain in range of motion and strength deficits and would greatly benefit from skilled PT at this time.  OBJECTIVE IMPAIRMENTS: Abnormal gait, decreased activity tolerance, decreased balance, decreased coordination, decreased endurance, decreased knowledge of use of DME, decreased mobility, difficulty walking, decreased ROM, decreased strength, improper body mechanics, postural dysfunction, and pain.   ACTIVITY LIMITATIONS: carrying, lifting, bending, sitting, standing, squatting, sleeping, stairs, transfers, bed mobility, bathing, toileting, dressing, hygiene/grooming, and locomotion level  PARTICIPATION LIMITATIONS: meal prep, cleaning, laundry, driving, shopping, community activity, and occupation  PERSONAL FACTORS: Age, Fitness, and 1 comorbidity: Osteomyelitis L5-S1  are also affecting patient's functional outcome.   REHAB POTENTIAL: Good  CLINICAL DECISION MAKING: Evolving/moderate complexity  EVALUATION COMPLEXITY: Moderate   GOALS: Goals reviewed with patient? yes  SHORT TERM GOALS: Target date: 08/10/2023 Patient will be independent in self management strategies to improve quality of life and functional outcomes. Baseline: New Program Goal status: INITIAL  2.  Patient will report at least 25 % improvement in overall symptoms and/or function to demonstrate improved functional  mobility Baseline: 0% better Goal status: INITIAL  3.  Patient will be able to transition from sit to stand and stand to sit from a normal height chair without the use of her arms Baseline: Unable and very painful Goal status: INITIAL  4.  Patient will be able to walk for 15 minutes with use of assistive device as needed without severe pain to improve ambulatory mobility in the home Baseline: Requires walker 5 to 10 minutes max with severe pain afterwards Goal status: INITIAL    LONG TERM GOALS: Target date: 09/20/2023 Patient will report at least 50 % improvement in overall symptoms and/or function to demonstrate improved functional mobility Baseline: 0% better Goal status: INITIAL  2.  Patient will improve score on FOTO outcomes measure to projected score to demonstrate overall improved function and QOL Baseline: see above Goal status: INITIAL  3.  Patient will be able to walk up to 30 minutes without the use of a rolling walker and without severe pain to improve overall function and return to prior level of function Baseline: 5 to 10 minutes with rolling walker painful Goal status: INITIAL  4.  Patient will be able to sit for at least 8 hours with intermittent standing without severe pain at the end of the dayto be able to perform job tasks Baseline: Currently 5 hours but severe pain afterwards Goal status: INITIAL   PLAN:  PT FREQUENCY: 2x/week  PT DURATION: 12 weeks  PLANNED INTERVENTIONS: Therapeutic exercises, Therapeutic activity, Neuromuscular re-education, Balance  training, Gait training, Patient/Family education, Self Care, Joint mobilization, Joint manipulation, Stair training, Vestibular training, Canalith repositioning, Orthotic/Fit training, Prosthetic training, DME instructions, Aquatic Therapy, Dry Needling, Electrical stimulation, Spinal manipulation, Spinal mobilization, Cryotherapy, Moist heat, Taping, Traction, Ultrasound, Ionotophoresis 4mg /ml Dexamethasone,  Manual therapy, and Re-evaluation.   PLAN FOR NEXT SESSION:PN no bending/lifting/twisting, gentle isometrics, explain interventions for reassurance and understanding, lx/hip ROM, supine/ gravity eliminated tolerated better. How are sx, did the towel roll support help prevent calf sx?    2:32 PM, 07/30/23 Tereasa Coop, DPT Physical Therapy with [facility]

## 2023-07-31 ENCOUNTER — Other Ambulatory Visit (HOSPITAL_BASED_OUTPATIENT_CLINIC_OR_DEPARTMENT_OTHER): Payer: Self-pay

## 2023-07-31 DIAGNOSIS — M4640 Discitis, unspecified, site unspecified: Secondary | ICD-10-CM | POA: Diagnosis not present

## 2023-07-31 MED ORDER — GABAPENTIN 300 MG PO CAPS
300.0000 mg | ORAL_CAPSULE | Freq: Three times a day (TID) | ORAL | 1 refills | Status: DC
  Filled 2023-07-31: qty 90, 15d supply, fill #0

## 2023-08-01 ENCOUNTER — Encounter: Payer: Self-pay | Admitting: Physical Therapy

## 2023-08-01 ENCOUNTER — Ambulatory Visit (INDEPENDENT_AMBULATORY_CARE_PROVIDER_SITE_OTHER): Payer: BC Managed Care – PPO | Admitting: Physical Therapy

## 2023-08-01 DIAGNOSIS — R262 Difficulty in walking, not elsewhere classified: Secondary | ICD-10-CM

## 2023-08-01 DIAGNOSIS — M5459 Other low back pain: Secondary | ICD-10-CM

## 2023-08-01 DIAGNOSIS — M6281 Muscle weakness (generalized): Secondary | ICD-10-CM

## 2023-08-01 DIAGNOSIS — M4626 Osteomyelitis of vertebra, lumbar region: Secondary | ICD-10-CM | POA: Diagnosis not present

## 2023-08-01 DIAGNOSIS — M4640 Discitis, unspecified, site unspecified: Secondary | ICD-10-CM | POA: Diagnosis not present

## 2023-08-01 NOTE — Therapy (Signed)
OUTPATIENT PHYSICAL THERAPY THORACOLUMBAR TREATMENT   Patient Name: Susan [name] MRN: [id number] DOB:05-Jun-1976, 47 y.o., female Today's Date: 08/01/2023  END OF SESSION:  PT End of Session - 08/01/23 1602     Visit Number 10    Number of Visits 24    Date for PT Re-Evaluation 09/20/23    Authorization Type [employer] 40 VL    Authorization - Visit Number 10    Authorization - Number of Visits 40    Progress Note Due on Visit 10    PT Start Time 1602    PT Stop Time 1641    PT Time Calculation (min) 39 min    Activity Tolerance Patient tolerated treatment well    Behavior During Therapy [facility] for tasks assessed/performed                Past Medical History:  Diagnosis Date   Abnormal pap 02/2003   CIN 2-3   Basal cell carcinoma of back 07/22/2021   GERD (gastroesophageal reflux disease)    Hyperlipidemia    Migraine with aura    Past Surgical History:  Procedure Laterality Date   BREAST BIOPSY Left 08/04/2022   [place] LT BREAST BX W LOC DEV 1ST LESION IMG BX SPEC US GUIDE 08/04/2022 [facility]AMMOGRAP[facility]   CERVICAL BIOPSY  W/ LOOP ELECTRODE EXCISION  02/2003   CIN 2-3   CHOLECYSTECTOMY  2015   COSMETIC SURGERY  2009   TEE WITHOUT CARDIOVERSION N/A 06/18/2023   Procedure: TRANSESOPHAGEAL ECHOCARDIOGRAM;  Surgeon: Wendall Stade, MD;  Location: [facility]VASIVE [facility];  Service: Cardiovascular;  Laterality: N/A;   Patient Active Problem List   Diagnosis Date Noted   Malnutrition of moderate degree 06/19/2023   Staphylococcus aureus infection 06/19/2023   Bacteremia with signs of infection 06/18/2023   Discitis 06/15/2023   Right lateral epicondylitis 04/12/2023   Plantar fasciitis of left foot 08/17/2022   Ascending aorta dilatation -- found on CT Sep 2023 --> Recheck CT in Sep 2024 05/31/2022   Chest wall pain 04/20/2022   Traumatic hematoma of lower leg, left, initial encounter 11/03/2021   Basal cell carcinoma of back 07/22/2021   Dermatofibroma 07/22/2021    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   History of malignant neoplasm of skin 07/22/2021   Lentigo 07/22/2021   Melanocytic nevi of trunk 07/22/2021   Neoplasm of uncertain behavior of skin 07/22/2021   Other skin changes due to chronic exposure to nonionizing radiation 07/22/2021   Skin tag 07/22/2021   Low back pain 08/03/2020   Nonallopathic lesion of sacral region 08/03/2020   Nonallopathic lesion of thoracic region 08/03/2020   Nonallopathic lesion of lumbar region 08/03/2020   Class 1 obesity due to excess calories without serious comorbidity with body mass index (BMI) of 33.0 to 33.9 in adult 11/17/2017   Hypercholesterolemia, not on statin 08/18/2017   H/O abnormal cervical Papanicolaou smear 01/06/2013    PCP: Jarold Motto, PA   REFERRING PROVIDER: Jarold Motto, PA   REFERRING DIAG: [phone] (ICD-10-CM) - Discitis of lumbosacral region   Rationale for Evaluation and Treatment: Rehabilitation  THERAPY DIAG:  Other low back pain  Difficulty in walking, not elsewhere classified  Muscle weakness (generalized)  ONSET DATE: 05/29/23 fall with subsequent osteomyelitis L5/S1  SUBJECTIVE:  SUBJECTIVE STATEMENT: 08/01/2023 Reports she is having a better week and having some sciatica in her calf but it is mainly in the AM and after sitting for longer periods of time. Arrives without walker. Still taking the gabapentin and has started to reduce it.  Eval: Patient presents with husband and is standing during evaluation secondary to high levels of pain and concern.  Patient unsure if she can even lay down on table for fear she would not be able to get back up due to pain  Patient for started having pain on 9 2 of this year and her left hip where she saw a doctor who gave her some medications.  She then  went abroad to [place] where she fell during a hike and went to a physician overseas.  He gave her an injection of medications.  When she came back she saw another doctor as she was having more pain and MRI demonstrated osteomyelitis at L5-S1. She was instructed to go to the ER and was subsequently admitted to hospital from 06/15/23 - 06/20/23 for discitis, bacteremia, staphylococcus aureus infection. Given IV antibiotics. Repeat blood cultures negative.  Patient was taking gabapentin, naproxen, ibuprofen and oxy but has run out of [place] and her GABA prescription is almost up currently she is in severe pain and concerned for her quality of life once her GABA prescription runs up.  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  She is currently having severe pain with all activities movements and can barely get comfortable side-lying is most comfortable but she tosses and turns she has not been on her stomach.  It feels like she gets stuck after sitting for a while and cannot get upright and everything just locks up on her.  She has never had pain like this before.  Patient also reports that her middle finger was also possibly injured on the right hand during a transfer at the hospital it is swollen and painful.  Her right knee is also bothering her and she has noticed numbness in her right great toe it used to be in her heel but now is primarily just in her great toe.  PERTINENT HISTORY:  Migraines, GERD  PAIN:  Are you having pain? Yes: NPRS scale: 1/10 Pain location: back and R calf  Pain description: aching and throbbing Aggravating factors: Movement sitting transitional movements Relieving factors: Medication  PRECAUTIONS: Fall  RED FLAGS: None   WEIGHT BEARING RESTRICTIONS: No  FALLS:  Has patient fallen in last 6 months? Yes. 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  DIAGNOSTIC FINDINGS:  Lumbar MRI 06/15/2023    IMPRESSION: 1. Positive for Discitis Osteomyelitis at L5-S1. Pronounced epidural inflammation there, and small sacral epidural abscess tracking within the right posterior and lateral sacral epidural space from S1 through at least S3, involving the exiting right sacral nerve roots. Subsequent mild to moderate L5-S1 spinal stenosis, in part due to dural thickening and enhancement. And bilateral L5 neural foraminal stenosis and foraminal inflammation.   2. No paraspinal  muscle abscess.  No other lumbar levels involved.    PATIENT SURVEYS:  FOTO 9%  SCREENING FOR RED FLAGS: Bowel or bladder incontinence: No Spinal tumors: No Cauda equina syndrome: No Compression fracture: No Abdominal aneurysm: No  COGNITION: Overall cognitive status: Within functional limits for tasks assessed     SENSATION: Not tested    POSTURE: Slumped to forward posture with heavy use hands on rolling walker  PALPATION: No tenderness to palpation noted in lumbar musculature or glutes  LUMBAR ROM: Unable to formally test secondary to high levels of pain  AROM   Flexion   Extension   Right lateral flexion   Left lateral flexion   Right rotation   Left rotation    (Blank rows = not tested)    LE Measurements unable to formally test secondary to high levels of pain Lower Extremity Right  Left    A/PROM MMT A/PROM MMT  Hip Flexion      Hip Extension      Hip Abduction      Hip Adduction      Hip Internal rotation      Hip External rotation      Knee Flexion      Knee Extension      Ankle Dorsiflexion       Ankle Plantarflexion      Ankle Inversion      Ankle Eversion       (Blank rows = not tested) * pain'   LUMBAR SPECIAL TESTS:  Unable to test secondary to high levels of pain FUNCTIONAL TESTS:  Sit to stand: Slow labored movements patient holding breath with increased pain strong use of arms. Unable to perform any bed mobilities today secondary to pain, fear of pain and fear of not being able to get up Stand to sit 3 attempts with elevated height of chair and strong use of her arms very painful slow-labored movements  GAIT: Distance walked: 25 feet in clinic Assistive device utilized: Walker - 2 wheeled Level of assistance: SBA Comments: Labored gait with a rolling walker, excessive weight on upper extremities, wide base of support, antalgic  TODAY'S TREATMENT:                                                                                                                              DATE:   08/01/2023  Therapeutic Exercise: Supine: bridges 12x2, Marches 5 minutes total- alternating, LTR - intermittent between marches ,  self mobilization to right glutes with tennis ball 5 minutes Prone:    Seated:STS 23 inch height x12 total - slow decent - rest between reps, piriformis stretch x3 30" holds B, LAQs 3x5  Standing: Neuromuscular Re-education: Manual Therapy:   Therapeutic Activity:   Self Care: Trigger Point Dry Needling:  Modalities:     PATIENT EDUCATION:  Education details: on HEP and rationale for interventions, importance of glute activation and anticipated DOMs  Person educated: Patient Education method: Explanation,  Demonstration, and Handouts Education comprehension: verbalized understanding   HOME EXERCISE PROGRAM: [id number]  ASSESSMENT:  CLINICAL IMPRESSION: 08/01/2023 Improved ability to perform bridges on this date without having to take rest breaks between each rep. Initially hesitant to perform new exercises but with encouragement able to perform  well without increase in pain. Slight sciatica noted end of session, this resolved with self mobilization to right glutes/ piriformis. Will continue with current POC as tolerated.    Eval: Patient presents to physical therapy with complaints of severe low back pain after a fall resulting in osteomyelitis of L5-S1.  Patient has been treated with a course of antibiotics in the hospital and blood work has come back negative.  She presents with use of rolling walker and severe pain with all movements and transitional movements.  Session limited on objective information today secondary to high levels of pain.  Primary goal of today's session was to reassure patient and her family and give pain management strategies to help cope with the current pain levels.  Patient is greatly limited in function due to pain in range of motion and strength deficits and would greatly benefit from skilled PT at this time.  OBJECTIVE IMPAIRMENTS: Abnormal gait, decreased activity tolerance, decreased balance, decreased coordination, decreased endurance, decreased knowledge of use of DME, decreased mobility, difficulty walking, decreased ROM, decreased strength, improper body mechanics, postural dysfunction, and pain.   ACTIVITY LIMITATIONS: carrying, lifting, bending, sitting, standing, squatting, sleeping, stairs, transfers, bed mobility, bathing, toileting, dressing, hygiene/grooming, and locomotion level  PARTICIPATION LIMITATIONS: meal prep, cleaning, laundry, driving, shopping, community activity, and occupation  PERSONAL FACTORS: Age, Fitness, and 1 comorbidity: Osteomyelitis L5-S1  are also affecting patient's functional outcome.   REHAB POTENTIAL: Good  CLINICAL DECISION MAKING: Evolving/moderate complexity  EVALUATION COMPLEXITY: Moderate   GOALS: Goals reviewed with patient? yes  SHORT TERM GOALS: Target date: 08/10/2023 Patient will be independent in self management strategies to improve quality of life  and functional outcomes. Baseline: New Program Goal status: INITIAL  2.  Patient will report at least 25 % improvement in overall symptoms and/or function to demonstrate improved functional mobility Baseline: 0% better Goal status: INITIAL  3.  Patient will be able to transition from sit to stand and stand to sit from a normal height chair without the use of her arms Baseline: Unable and very painful Goal status: INITIAL  4.  Patient will be able to walk for 15 minutes with use of assistive device as needed without severe pain to improve ambulatory mobility in the home Baseline: Requires walker 5 to 10 minutes max with severe pain afterwards Goal status: INITIAL    LONG TERM GOALS: Target date: 09/20/2023 Patient will report at least 50 % improvement in overall symptoms and/or function to demonstrate improved functional mobility Baseline: 0% better Goal status: INITIAL  2.  Patient will improve score on FOTO outcomes measure to projected score to demonstrate overall improved function and QOL Baseline: see above Goal status: INITIAL  3.  Patient will be able to walk up to 30 minutes without the use of a rolling walker and without severe pain to improve overall function and return to prior level of function Baseline: 5 to 10 minutes with rolling walker painful Goal status: INITIAL  4.  Patient will be able to sit for at least 8 hours with intermittent standing without severe pain at the end of the dayto be able to perform job tasks Baseline: Currently 5 hours but severe pain afterwards Goal status: INITIAL  PLAN:  PT FREQUENCY: 2x/week  PT DURATION: 12 weeks  PLANNED INTERVENTIONS: Therapeutic exercises, Therapeutic activity, Neuromuscular re-education, Balance training, Gait training, Patient/Family education, Self Care, Joint mobilization, Joint manipulation, Stair training, Vestibular training, Canalith repositioning, Orthotic/Fit training, Prosthetic training, DME  instructions, Aquatic Therapy, Dry Needling, Electrical stimulation, Spinal manipulation, Spinal mobilization, Cryotherapy, Moist heat, Taping, Traction, Ultrasound, Ionotophoresis 4mg /ml Dexamethasone, Manual therapy, and Re-evaluation.   PLAN FOR NEXT SESSION:PN no bending/lifting/twisting, gentle isometrics, explain interventions for reassurance and understanding, lx/hip ROM, supine/ gravity eliminated tolerated better. How are sx, did the towel roll support help prevent calf sx?    4:44 PM, 08/01/23 Tereasa Coop, DPT Physical Therapy with [facility]

## 2023-08-02 ENCOUNTER — Encounter: Payer: BC Managed Care – PPO | Admitting: Physical Therapy

## 2023-08-02 ENCOUNTER — Telehealth: Payer: Self-pay

## 2023-08-02 ENCOUNTER — Other Ambulatory Visit: Payer: Self-pay

## 2023-08-02 ENCOUNTER — Encounter: Payer: Self-pay | Admitting: Internal Medicine

## 2023-08-02 ENCOUNTER — Ambulatory Visit (INDEPENDENT_AMBULATORY_CARE_PROVIDER_SITE_OTHER): Payer: BC Managed Care – PPO | Admitting: Internal Medicine

## 2023-08-02 VITALS — BP 123/85 | HR 68 | Temp 97.1°F | Ht 68.0 in | Wt 203.0 lb

## 2023-08-02 DIAGNOSIS — M4640 Discitis, unspecified, site unspecified: Secondary | ICD-10-CM | POA: Diagnosis not present

## 2023-08-02 DIAGNOSIS — G062 Extradural and subdural abscess, unspecified: Secondary | ICD-10-CM | POA: Diagnosis not present

## 2023-08-02 DIAGNOSIS — M4626 Osteomyelitis of vertebra, lumbar region: Secondary | ICD-10-CM

## 2023-08-02 MED ORDER — ONDANSETRON 4 MG PO TBDP: 4.0000 mg | ORAL_TABLET | Freq: Three times a day (TID) | ORAL | 1 refills | Status: DC | PRN

## 2023-08-02 MED ORDER — DOXYCYCLINE HYCLATE 100 MG PO TABS: 100.0000 mg | ORAL_TABLET | Freq: Two times a day (BID) | ORAL | 1 refills | Status: DC

## 2023-08-02 NOTE — Progress Notes (Signed)
RFV: follow up for MSSA bacteremia and epidural abscess  Patient ID: Susan [name], female   DOB: [birth year], 47 y.o.   MRN: [id number]  HPI 47yo F with no significant past medical history, who was hospitalized with sepsis due to MSSA bacteremia, found to have epidural abscess. She is currently receiving cefazolin 2g IV Q8hr through 11/25. She is Going to PT to help with mobility.  Picc line dressing changed weekly. She recently had repeat mri on 11/3.  Outpatient Encounter Medications as of 08/02/2023  Medication Sig   ceFAZolin (ANCEF) IVPB Inject 2 g into the vein every 8 (eight) hours. Indication:   MSSA discitis/epidural abscess First Dose: Yes Last Day of Therapy:  08/13/23 Labs - Once weekly:  CBC/D and BMP, Labs - Once weekly: ESR and CRP Method of administration: IV Push Method of administration may be changed at the discretion of home infusion pharmacist based upon assessment of the patient and/or caregiver's ability to self-administer the medication ordered.   diclofenac (VOLTAREN) 75 MG EC tablet Take 1 tablet (75 mg total) by mouth 2 (two) times daily.   gabapentin (NEURONTIN) 300 MG capsule Take 1-2 capsules (300-600 mg total) by mouth 3 (three) times daily.   pantoprazole (PROTONIX) 40 MG tablet TAKE 1 TABLET BY MOUTH EVERY DAY   traZODone (DESYREL) 100 MG tablet TAKE 1 TABLET BY MOUTH EVERYDAY AT BEDTIME   pravastatin (PRAVACHOL) 80 MG tablet TAKE 1 TABLET BY MOUTH EVERY DAY (Patient not taking: Reported on 08/02/2023)   No facility-administered encounter medications on file as of 08/02/2023.     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   History of malignant neoplasm of skin 07/22/2021   Lentigo 07/22/2021   Melanocytic nevi of trunk 07/22/2021   Neoplasm of uncertain behavior of skin 07/22/2021   Other skin changes due to chronic exposure to nonionizing radiation 07/22/2021   Skin tag 07/22/2021   Low back pain 08/03/2020   Nonallopathic lesion of sacral region 08/03/2020   Nonallopathic lesion of thoracic region 08/03/2020   Nonallopathic lesion of lumbar region 08/03/2020   Class 1 obesity due to excess calories without serious comorbidity with body mass index (BMI) of 33.0 to 33.9 in adult 11/17/2017   Hypercholesterolemia, not on statin 08/18/2017   H/O abnormal cervical Papanicolaou smear 01/06/2013     There are no preventive care reminders to display for this patient.   Review of Systems  Constitutional: Negative for fever, chills, diaphoresis, activity change, appetite change, fatigue and unexpected weight change.  HENT: Negative for congestion, sore throat, rhinorrhea, sneezing, trouble swallowing and sinus pressure.  Eyes: Negative for photophobia and visual disturbance.  Respiratory: Negative for cough, chest tightness, shortness of breath, wheezing and stridor.  Cardiovascular: Negative for chest pain, palpitations and leg swelling.  Gastrointestinal: Negative for nausea, vomiting, abdominal pain, diarrhea, constipation, blood in stool, abdominal distention and anal bleeding.  Genitourinary: Negative for dysuria, hematuria, flank pain and difficulty urinating.  Musculoskeletal: Negative for myalgias, back pain, joint swelling, arthralgias and gait problem.  Skin: Negative for color change, pallor, rash and wound.  Neurological: Negative for dizziness, tremors, weakness and  light-headedness.  Hematological: Negative for adenopathy. Does not bruise/bleed easily.  Psychiatric/Behavioral: Negative for behavioral problems, confusion, sleep disturbance, dysphoric mood, decreased concentration and agitation.   Physical Exam   BP 123/85   Pulse 68   Temp (!) 97.1 F (36.2 C) (Temporal)   Ht 5\' 8"  (1.727 m)   Wt 203 lb (92.1 kg)   LMP 07/01/2023 (Approximate)   BMI 30.87 kg/m   Physical Exam  Constitutional:  oriented to person, place, and time. appears well-developed and well-nourished. No distress.  HENT: [place]/AT, PERRLA, no scleral icterus Mouth/Throat: Oropharynx is clear and moist. No oropharyngeal exudate.  Ext: picc line is c/d/i Neurological: alert and oriented to person, place, and time.  Skin: Skin is warm and dry. No rash noted. No erythema.  Psychiatric: a normal mood and affect.  behavior is normal.    CBC Lab Results  Component Value Date   WBC 11.4 (H) 06/20/2023   RBC 3.55 (L) 06/20/2023   HGB 9.9 (L) 06/20/2023   HCT 31.4 (L) 06/20/2023   PLT 877 (H) 06/20/2023   MCV 88.5 06/20/2023   MCH 27.9 06/20/2023   MCHC 31.5 06/20/2023   RDW 14.0 06/20/2023   LYMPHSABS 2.4 06/17/2023   MONOABS 0.7 06/17/2023   EOSABS 0.1 06/17/2023    BMET Lab Results  Component Value Date   NA 137 06/27/2023   K 4.1 06/27/2023   CL 100 06/27/2023   CO2 29 06/27/2023   GLUCOSE 97 06/27/2023   BUN 18 06/27/2023   CREATININE 0.89 06/27/2023   CALCIUM 10.0 06/27/2023   GFRNONAA >60 06/20/2023   GFRAA >60 09/01/2019    Narrative & Impression  CLINICAL DATA:  Low back pain, fall 1.5 months ago, right leg pain   EXAM: MRI LUMBAR SPINE WITHOUT CONTRAST   TECHNIQUE: Multiplanar, multisequence MR imaging of the lumbar spine was performed. No intravenous contrast was administered.   COMPARISON:  06/15/2023   FINDINGS: Segmentation:  5 lumbar type vertebral bodies.   Alignment:  No listhesis.   Vertebrae: Evaluation is somewhat limited by the  absence of intravenous contrast. Increased abnormal marrow signal at L5 and S1, which extends further into the S1 sacral ala bilaterally (series 8, images 2 and 14), previously greater on the right. The abnormal signal now involves more of the L5 and S1 vertebral bodies. There is erosion of the cortex at L5-S1, with further increased T2 signal in the disc space. Previously noted T2 hyperintense material in the presacral space persists but is slightly decreased compared to the prior exam (series 8, image 13). Increased T2 signal is also noted between the spinous processes of L5 and S1 (series 8, image 8.   No other abnormal signal in the lumbar spine.   Conus medullaris and cauda equina: Conus extends to the L2 level. The nerve roots appear thickened and clumping at L5 and S1 (series 14, image 32). A previously noted epidural collection is no longer seen.   Paraspinal and other soft tissues: Redemonstrated T2 hyperintense material along the anterior and left-greater-than-right aspects of L5 and S1 (series 8, images 5-13),, although this appears slightly decreased in quantity compared to the prior exam. No evidence of psoas abscess. Some edema is noted in the right greater than left medial posterior paraspinous muscles (series 8, image 5 and 11).  Disc levels:   T12-L4: Negative.   L4-L5: Disc desiccation with small central disc bulge with annular fissure. Mild facet arthropathy. No spinal canal stenosis or neural foraminal narrowing.   L5-S1: Disc height loss and increased erosive endplate changes. Previously noted epidural collection is no longer seen. Fluid in the facets bilaterally (series 8, images 6 and 12). Inflammatory changes extend into the bilateral neural foramina. No remaining spinal canal stenosis. Mild left and moderate right neural foraminal narrowing.   IMPRESSION: 1. Evaluation is somewhat limited by the absence of intravenous contrast. Within this  limitation, redemonstrated findings consistent with discitis-osteomyelitis, with worsening abnormal marrow signal in L5 and S1, which now extends further into the sacral ala bilaterally. 2. Previously noted epidural collection is no longer seen, with resolved spinal canal stenosis at L5-S1, but worsening neural foraminal narrowing, likely secondary to inflammatory changes as well as progressive disc height loss. 3. 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 4. Slightly decreased prevertebral phlegmon. 5. Fluid in the facets bilaterally at L5-S1, as well as between the spinous processes of L5 and S1, which may be reactive or infectious. Edema in the adjacent right greater than left medial inferior posterior paraspinous muscles may also be reactive or infectious.    Assessment and Plan Plan to continue cefazolin through 11/25 Pull picc line on 11/26 Repeat mri lumbar w wo con at end of the year Start doxy for chronic suppression on 11/26 Rtc in 7-8 wk

## 2023-08-02 NOTE — Progress Notes (Signed)
[name] [address] [place] [address] Phone: [phone] Subjective:   [name], am serving as a scribe for Dr. Antoine Primas.  I'm seeing this patient by the request  of:  Jarold Motto, [name]  CC: Low back pain follow-up  [id number]  07/03/2023 Patient had knee swelling noted.  None patient is doing better overall.  Still needs to work on some of the range of motion and exercises which I do think formal physical therapy will be helpful.  We discussed with patient about other different medications.  At this point patient did not want to make any significant changes and feels that the pain medication is helpful.  Hopefully with patient starting to increase her activity she will be able to start doing strengthening and other modalities that will help her discontinue this medication in the near future.  We did discuss the possibility of other nerve modulators but patient wanted to hold at the moment.  Follow-up with me again in 2 months.  Continue the IV antibiotics for total of 6 weeks Toradol and Depo-Medrol also given today secondary to patient's pain.     Updated 08/06/2023 Susan [name] is a 47 y.o. female coming in with complaint of here to follow up on everything. Feels a little better each day.  MRI IMPRESSION: 1. Evaluation is somewhat limited by the absence of intravenous contrast. Within this limitation, redemonstrated findings consistent with discitis-osteomyelitis, with worsening abnormal marrow signal in L5 and S1, which now extends further into the sacral ala bilaterally. 2. Previously noted epidural collection is no longer seen, with resolved spinal canal stenosis at L5-S1, but worsening neural foraminal narrowing, likely secondary to inflammatory changes as well as progressive disc height loss. 3. 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 4. Slightly decreased prevertebral  phlegmon. 5. Fluid in the facets bilaterally at L5-S1, as well as between the spinous processes of L5 and S1, which may be reactive or infectious. Edema in the adjacent right greater than left medial inferior posterior paraspinous muscles may also be reactive or infectious.     Past Medical History:  Diagnosis Date   Abnormal pap 02/2003   CIN 2-3   Basal cell carcinoma of back 07/22/2021   GERD (gastroesophageal reflux disease)    Hyperlipidemia    Migraine with aura    Past Surgical History:  Procedure Laterality Date   BREAST BIOPSY Left 08/04/2022   [place] LT BREAST BX W LOC DEV 1ST LESION IMG BX SPEC US GUIDE 08/04/2022 [facility]   CERVICAL BIOPSY  W/ LOOP ELECTRODE EXCISION  02/2003   CIN 2-3   CHOLECYSTECTOMY  2015   COSMETIC SURGERY  2009   TEE WITHOUT CARDIOVERSION N/A 06/18/2023   Procedure: TRANSESOPHAGEAL ECHOCARDIOGRAM;  Surgeon: Wendall Stade, MD;  Location: [facility];  Service: Cardiovascular;  Laterality: N/A;   Social History   Socioeconomic History   Marital status: Married    Spouse name: Not on file   Number of children: 0   Years of education: Not on file   Highest education level: Not on file  Occupational History    Employer: [employer]  Tobacco Use   Smoking status: Never   Smokeless tobacco: Never  Vaping Use   Vaping status: Never Used  Substance and Sexual Activity   Alcohol use: Yes    Alcohol/week: 2.0 standard drinks of alcohol    Types: 2 Glasses of wine per week  Comment: 1-2 glasses/week   Drug use: No   Sexual activity: Yes    Partners: Male    Birth control/protection: Condom  Other Topics Concern   Not on file  Social History Narrative   Married.    No children.    Social Determinants of Health   Financial Resource Strain: Not on file  Food Insecurity: No Food Insecurity (06/15/2023)   Hunger Vital Sign    Worried About Running Out of Food in the Last Year: Never true    Ran Out of Food in the Last Year:  Never true  Transportation Needs: No Transportation Needs (06/15/2023)   PRAPARE - Administrator, Civil Service (Medical): No    Lack of Transportation (Non-Medical): No  Physical Activity: Not on file  Stress: Not on file  Social Connections: Not on file   No Known Allergies Family History  Problem Relation Age of Onset   HIV Mother    Other Mother    Early death Mother        Age 30   Hypertension Father    Depression Father        Suicide   Early death Brother        Age 61   Drug abuse Brother    Hypertension Maternal Grandmother    Diabetes Maternal Grandmother    Stroke Maternal Grandmother    Heart failure Maternal Grandfather    Heart disease Maternal Grandfather    Diabetes Paternal Grandmother    Depression Paternal Grandfather        Suicide   Colon cancer Neg Hx    Colon polyps Neg Hx    Esophageal cancer Neg Hx    Rectal cancer Neg Hx    Stomach cancer Neg Hx      Current Outpatient Medications (Cardiovascular):    pravastatin (PRAVACHOL) 80 MG tablet, TAKE 1 TABLET BY MOUTH EVERY DAY (Patient not taking: Reported on 08/02/2023)   Current Outpatient Medications (Analgesics):    diclofenac (VOLTAREN) 75 MG EC tablet, Take 1 tablet (75 mg total) by mouth 2 (two) times daily.   Current Outpatient Medications (Other):    ceFAZolin (ANCEF) IVPB, Inject 2 g into the vein every 8 (eight) hours. Indication:   MSSA discitis/epidural abscess First Dose: Yes Last Day of Therapy:  08/13/23 Labs - Once weekly:  CBC/D and BMP, Labs - Once weekly: ESR and CRP Method of administration: IV Push Method of administration may be changed at the discretion of home infusion pharmacist based upon assessment of the patient and/or caregiver's ability to self-administer the medication ordered.   doxycycline (VIBRA-TABS) 100 MG tablet, Take 1 tablet (100 mg total) by mouth 2 (two) times daily. Start on 11/26   gabapentin (NEURONTIN) 300 MG capsule, Take 1-2 capsules  (300-600 mg total) by mouth 3 (three) times daily.   ondansetron (ZOFRAN-ODT) 4 MG disintegrating tablet, Take 1 tablet (4 mg total) by mouth every 8 (eight) hours as needed for nausea or vomiting.   pantoprazole (PROTONIX) 40 MG tablet, TAKE 1 TABLET BY MOUTH EVERY DAY   traZODone (DESYREL) 100 MG tablet, TAKE 1 TABLET BY MOUTH EVERYDAY AT BEDTIME   Reviewed prior external information including notes and imaging from  primary care provider As well as notes that were available from care everywhere and other healthcare systems.  Past medical history, social, surgical and family history all reviewed in electronic medical record.  No pertanent information unless stated regarding to the chief complaint.  Review of Systems:  No headache, visual changes, nausea, vomiting, diarrhea, constipation, dizziness, abdominal pain, skin rash, fevers, chills, night sweats, weight loss, swollen lymph nodes, body aches, joint swelling, chest pain, shortness of breath, mood changes. POSITIVE muscle aches  Objective  Blood pressure 108/70, pulse 77, height 5\' 8"  (1.727 m), last menstrual period 07/01/2023, SpO2 96%.   General: No apparent distress alert and oriented x3 mood and affect normal, dressed appropriately.  HEENT: Pupils equal, extraocular movements intact  Respiratory: Patient's speak in full sentences and does not appear short of breath  Cardiovascular: No lower extremity edema, non tender, no erythema  Low back does have some loss of lordosis still noted.  Able to get out of a seated position without any significant difficulty.  Significant improvement in balance and coordination.    Impression and Recommendations:    The above documentation has been reviewed and is accurate and complete Judi Saa, DO

## 2023-08-02 NOTE — Telephone Encounter (Signed)
IV abx end date 08/13/23 per Dr. Drue Second and picc can be removed after last dose. Orders given to [facility]. Misti Towle Jonathon Resides, CMA

## 2023-08-03 ENCOUNTER — Other Ambulatory Visit (HOSPITAL_BASED_OUTPATIENT_CLINIC_OR_DEPARTMENT_OTHER): Payer: Self-pay

## 2023-08-03 DIAGNOSIS — M4640 Discitis, unspecified, site unspecified: Secondary | ICD-10-CM | POA: Diagnosis not present

## 2023-08-04 DIAGNOSIS — M4640 Discitis, unspecified, site unspecified: Secondary | ICD-10-CM | POA: Diagnosis not present

## 2023-08-05 DIAGNOSIS — M4640 Discitis, unspecified, site unspecified: Secondary | ICD-10-CM | POA: Diagnosis not present

## 2023-08-06 ENCOUNTER — Ambulatory Visit (INDEPENDENT_AMBULATORY_CARE_PROVIDER_SITE_OTHER): Payer: BC Managed Care – PPO | Admitting: Family Medicine

## 2023-08-06 VITALS — BP 108/70 | HR 77 | Ht 68.0 in

## 2023-08-06 DIAGNOSIS — M545 Low back pain, unspecified: Secondary | ICD-10-CM | POA: Diagnosis not present

## 2023-08-06 DIAGNOSIS — G8929 Other chronic pain: Secondary | ICD-10-CM

## 2023-08-06 DIAGNOSIS — M4640 Discitis, unspecified, site unspecified: Secondary | ICD-10-CM | POA: Diagnosis not present

## 2023-08-06 NOTE — Assessment & Plan Note (Signed)
Patient low back did have a staph infection with osteomyelitis.  Patient has seen infectious disease.  Is scheduled to have another MRI in December with and without contrast to further evaluate the abscess but seems to be improving.  Held on any and manipulation of the lower back at the moment.  Did work on her neck though with some improvement.  Discussed with patient no other changes in medications but just try to titrate off some of the gabapentin and the diclofenac.  Follow-up with me again in 2 to 3 months

## 2023-08-06 NOTE — Patient Instructions (Signed)
I do think a redo of [place] is needed Send message when you get MRI See you again in 6-8 weeks

## 2023-08-07 ENCOUNTER — Ambulatory Visit (INDEPENDENT_AMBULATORY_CARE_PROVIDER_SITE_OTHER): Payer: BC Managed Care – PPO | Admitting: Physical Therapy

## 2023-08-07 ENCOUNTER — Encounter: Payer: Self-pay | Admitting: Physical Therapy

## 2023-08-07 DIAGNOSIS — M4640 Discitis, unspecified, site unspecified: Secondary | ICD-10-CM | POA: Diagnosis not present

## 2023-08-07 DIAGNOSIS — R262 Difficulty in walking, not elsewhere classified: Secondary | ICD-10-CM

## 2023-08-07 DIAGNOSIS — M6281 Muscle weakness (generalized): Secondary | ICD-10-CM

## 2023-08-07 DIAGNOSIS — M5459 Other low back pain: Secondary | ICD-10-CM | POA: Diagnosis not present

## 2023-08-07 NOTE — Therapy (Signed)
OUTPATIENT PHYSICAL THERAPY THORACOLUMBAR TREATMENT Progress Note Reporting Period 06/28/23 to 08/07/23  See note below for Objective Data and Assessment of Progress/Goals.      Patient Name: Susan [name] MRN: [id number] DOB:[birth year], 47 y.o., female Today's Date: 08/07/2023  END OF SESSION:  PT End of Session - 08/07/23 1518     Visit Number 11    Number of Visits 24    Date for PT Re-Evaluation 09/20/23    Authorization Type [employer] 40 VL    Authorization - Visit Number 11    Authorization - Number of Visits 40    Progress Note Due on Visit 10    PT Start Time 1519    PT Stop Time 1557    PT Time Calculation (min) 38 min    Activity Tolerance Patient tolerated treatment well    Behavior During Therapy WFL for tasks assessed/performed                Past Medical History:  Diagnosis Date   Abnormal pap 02/2003   CIN 2-3   Basal cell carcinoma of back 07/22/2021   GERD (gastroesophageal reflux disease)    Hyperlipidemia    Migraine with aura    Past Surgical History:  Procedure Laterality Date   BREAST BIOPSY Left 08/04/2022   [place] LT BREAST BX W LOC DEV 1ST LESION IMG BX SPEC US GUIDE 08/04/2022 [facility]   CERVICAL BIOPSY  W/ LOOP ELECTRODE EXCISION  02/2003   CIN 2-3   CHOLECYSTECTOMY  2015   COSMETIC SURGERY  2009   TEE WITHOUT CARDIOVERSION N/A 06/18/2023   Procedure: TRANSESOPHAGEAL ECHOCARDIOGRAM;  Surgeon: Wendall Stade, MD;  Location: [facility];  Service: Cardiovascular;  Laterality: N/A;   Patient Active Problem List   Diagnosis Date Noted   Malnutrition of moderate degree 06/19/2023   Staphylococcus aureus infection 06/19/2023   Bacteremia with signs of infection 06/18/2023   Discitis 06/15/2023   Right lateral epicondylitis 04/12/2023   Plantar fasciitis of left foot 08/17/2022   Ascending aorta dilatation -- found on CT Sep 2023 --> Recheck CT in Sep 2024 05/31/2022   Chest wall pain 04/20/2022   Traumatic  hematoma of lower leg, left, initial encounter 11/03/2021   Basal cell carcinoma of back 07/22/2021   Dermatofibroma 07/22/2021   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   History of malignant neoplasm of skin 07/22/2021   Lentigo 07/22/2021   Melanocytic nevi of trunk 07/22/2021   Neoplasm of uncertain behavior of skin 07/22/2021   Other skin changes due to chronic exposure to nonionizing radiation 07/22/2021   Skin tag 07/22/2021   Low back pain 08/03/2020   Nonallopathic lesion of sacral region 08/03/2020   Nonallopathic lesion of thoracic region 08/03/2020   Nonallopathic lesion of lumbar region 08/03/2020   Class 1 obesity due to excess calories without serious comorbidity with body mass index (BMI) of 33.0 to 33.9 in adult 11/17/2017   Hypercholesterolemia, not on statin 08/18/2017   H/O abnormal cervical Papanicolaou smear 01/06/2013    PCP: Jarold Motto, PA   REFERRING PROVIDER: Jarold Motto, PA   REFERRING DIAG: [phone] (ICD-10-CM) - Discitis of lumbosacral region   Rationale for Evaluation and Treatment: Rehabilitation  THERAPY DIAG:  Other low back pain  Difficulty in walking, not elsewhere classified  Muscle weakness (generalized)  ONSET DATE: 05/29/23 fall with subsequent osteomyelitis L5/S1  SUBJECTIVE:  SUBJECTIVE STATEMENT: 08/07/2023 States she is achy and has stiffness especially after sitting a while. States her exercises are going well. Reports she feels 80% better since the start of PT.  Eval: Patient presents with husband and is standing during evaluation secondary to high levels of pain and concern.  Patient unsure if she can even lay down on table for fear she would not be able to get back up due to pain  Patient for started having pain on 9 2 of this year and her left hip  where she saw a doctor who gave her some medications.  She then went abroad to [place] where she fell during a hike and went to a physician overseas.  He gave her an injection of medications.  When she came back she saw another doctor as she was having more pain and MRI demonstrated osteomyelitis at L5-S1. She was instructed to go to the ER and was subsequently admitted to hospital from 06/15/23 - 06/20/23 for discitis, bacteremia, staphylococcus aureus infection. Given IV antibiotics. Repeat blood cultures negative.  Patient was taking gabapentin, naproxen, ibuprofen and oxy but has run out of [place] and her GABA prescription is almost up currently she is in severe pain and concerned for her quality of life once her GABA prescription runs up.  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  She is currently having severe pain with all activities movements and can barely get comfortable side-lying is most comfortable but she tosses and turns she has not been on her stomach.  It feels like she gets stuck after sitting for a while and cannot get upright and everything just locks up on her.  She has never had pain like this before.  Patient also reports that her middle finger was also possibly injured on the right hand during a transfer at the hospital it is swollen and painful.  Her right knee is also bothering her and she has noticed numbness in her right great toe it used to be in her heel but now is primarily just in her great toe.  PERTINENT HISTORY:  Migraines, GERD  PAIN:  Are you having pain? Yes: NPRS scale: 0/10 Pain location: back and R calf  Pain description: aching and throbbing Aggravating factors: Movement sitting transitional movements Relieving factors: Medication  PRECAUTIONS: Fall  RED FLAGS: None   WEIGHT BEARING RESTRICTIONS: No  FALLS:  Has patient fallen in last 6 months? Yes. 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  DIAGNOSTIC FINDINGS:  Lumbar MRI 06/15/2023    IMPRESSION: 1. Positive for Discitis Osteomyelitis at L5-S1. Pronounced epidural inflammation there, and small sacral epidural abscess tracking within the right posterior and lateral sacral epidural space from S1 through at least S3, involving the exiting right sacral nerve roots. Subsequent mild to moderate L5-S1 spinal stenosis, in part due to dural thickening and enhancement. And bilateral L5 neural foraminal stenosis and foraminal inflammation.   2. No paraspinal muscle abscess.  No other lumbar levels involved.    PATIENT SURVEYS:  FOTO 9%  SCREENING FOR RED FLAGS: Bowel or bladder incontinence: No Spinal tumors: No Cauda equina syndrome: No Compression fracture: No Abdominal aneurysm: No  COGNITION: Overall cognitive status: Within functional limits for tasks assessed     SENSATION: Not tested    POSTURE: Slumped to forward posture with heavy use hands on rolling walker  PALPATION: No tenderness to palpation noted in lumbar musculature or glutes  LUMBAR ROM: Unable to formally test secondary to high levels of pain  AROM   Flexion   Extension   Right lateral flexion   Left lateral flexion   Right rotation   Left rotation    (Blank rows = not tested)    LE Measurements unable to formally test secondary to high levels of pain Lower Extremity Right  Left    A/PROM MMT A/PROM MMT  Hip Flexion      Hip Extension      Hip Abduction      Hip Adduction      Hip Internal rotation      Hip External rotation      Knee  Flexion      Knee Extension      Ankle Dorsiflexion      Ankle Plantarflexion      Ankle Inversion      Ankle Eversion       (Blank rows = not tested) * pain'   LUMBAR SPECIAL TESTS:  Unable to test secondary to high levels of pain FUNCTIONAL TESTS:  Sit to stand: Slow labored movements patient holding breath with increased pain strong use of arms. Unable to perform any bed mobilities today secondary to pain, fear of pain and fear of not being able to get up Stand to sit 3 attempts with elevated height of chair and strong use of her arms very painful slow-labored movements  GAIT: Distance walked: 25 feet in clinic Assistive device utilized: Walker - 2 wheeled Level of assistance: SBA Comments: Labored gait with a rolling walker, excessive weight on upper extremities, wide base of support, antalgic  TODAY'S TREATMENT:                                                                                                                              DATE:   08/07/2023  Therapeutic Exercise: Objective measures updated.  Prone:    Seated:STS 16 inch height x15 total - slow decent , piriformis stretch x3 30" holds B, LAQs 3x5, lumbar flexion stretch 2 minutes total   Standing: hip abd 3x10 B, hip extension B UE support 5x5, SLS x10 best attempts up to 30 seconds  Neuromuscular Re-education: Manual Therapy:   Therapeutic Activity:   Self Care: Trigger Point Dry Needling:  Modalities:     PATIENT EDUCATION:  Education details: on HEP, FOTO score, on progress made  Person educated: Patient Education method: Explanation, Demonstration, and Handouts Education comprehension: verbalized understanding   HOME EXERCISE PROGRAM: [id number]  ASSESSMENT:  CLINICAL IMPRESSION:  08/07/2023 Progress note performed on this date. Improvements noted in function and FOTO score. Has met all but one long and short term goal. Focused  on addition of standing exercises. Super set these with seated  interventions secondary to fatigue. Tolerated well with no increase in pain but fatigue noted. Overall patient doing well but hesitant to perform new exercises secondary to initial odd feeling with interventions. Will continue with current POC as tolerated.    Eval: Patient presents to physical therapy with complaints of severe low back pain after a fall resulting in osteomyelitis of L5-S1.  Patient has been treated with a course of antibiotics in the hospital and blood work has come back negative.  She presents with use of rolling walker and severe pain with all movements and transitional movements.  Session limited on objective information today secondary to high levels of pain.  Primary goal of today's session was to reassure patient and her family and give pain management strategies to help cope with the current pain levels.  Patient is greatly limited in function due to pain in range of motion and strength deficits and would greatly benefit from skilled PT at this time.  OBJECTIVE IMPAIRMENTS: Abnormal gait, decreased activity tolerance, decreased balance, decreased coordination, decreased endurance, decreased knowledge of use of DME, decreased mobility, difficulty walking, decreased ROM, decreased strength, improper body mechanics, postural dysfunction, and pain.   ACTIVITY LIMITATIONS: carrying, lifting, bending, sitting, standing, squatting, sleeping, stairs, transfers, bed mobility, bathing, toileting, dressing, hygiene/grooming, and locomotion level  PARTICIPATION LIMITATIONS: meal prep, cleaning, laundry, driving, shopping, community activity, and occupation  PERSONAL FACTORS: Age, Fitness, and 1 comorbidity: Osteomyelitis L5-S1  are also affecting patient's functional outcome.   REHAB POTENTIAL: Good  CLINICAL DECISION MAKING: Evolving/moderate complexity  EVALUATION COMPLEXITY: Moderate   GOALS: Goals reviewed with patient? yes  SHORT TERM GOALS: Target date: 08/10/2023 Patient  will be independent in self management strategies to improve quality of life and functional outcomes. Baseline: New Program Goal status: PROGRESSING  2.  Patient will report at least 25 % improvement in overall symptoms and/or function to demonstrate improved functional mobility Baseline: 0% better Goal status: MET  3.  Patient will be able to transition from sit to stand and stand to sit from a normal height chair without the use of her arms Baseline: Unable and very painful Goal status: MET  4.  Patient will be able to walk for 15 minutes with use of assistive device as needed without severe pain to improve ambulatory mobility in the home Baseline: Requires walker 5 to 10 minutes max with severe pain afterwards Goal status: MET    LONG TERM GOALS: Target date: 09/20/2023 Patient will report at least 50 % improvement in overall symptoms and/or function to demonstrate improved functional mobility Baseline: 0% better Goal status: MET  2.  Patient will improve score on FOTO outcomes measure to projected score to demonstrate overall improved function and QOL Baseline: see above Goal status: MET  3.  Patient will be able to walk up to 30 minutes without the use of a rolling walker and without severe pain to improve overall function and return to prior level of function Baseline: 5 to 10 minutes with rolling walker painful Goal status: MET  4.  Patient will be able to sit for at least 8 hours with intermittent standing without severe pain at the end of the dayto be able to perform job tasks Baseline: Currently 5 hours but severe pain afterwards Goal status: PROGRESSING -  current 1 hour   PLAN:  PT FREQUENCY: 2x/week  PT DURATION: 12 weeks  PLANNED INTERVENTIONS: Therapeutic exercises, Therapeutic activity, Neuromuscular re-education, Balance training, Gait training, Patient/Family education, Self Care, Joint mobilization, Joint manipulation, Stair training, Vestibular training,  Canalith repositioning, Orthotic/Fit training, Prosthetic training, DME instructions, Aquatic Therapy, Dry Needling, Electrical stimulation, Spinal manipulation, Spinal mobilization, Cryotherapy, Moist heat, Taping, Traction, Ultrasound, Ionotophoresis 4mg /ml Dexamethasone, Manual therapy, and Re-evaluation.   PLAN FOR NEXT SESSION: no bending/lifting/twisting, gentle isometrics, explain interventions for reassurance and understanding, lx/hip ROM, supine/ gravity eliminated tolerated better. How are sx, did the towel roll support help prevent calf sx?    3:57 PM, 08/07/23 Tereasa Coop, DPT Physical Therapy with [facility]

## 2023-08-08 ENCOUNTER — Encounter: Payer: Self-pay | Admitting: Internal Medicine

## 2023-08-08 DIAGNOSIS — M4626 Osteomyelitis of vertebra, lumbar region: Secondary | ICD-10-CM | POA: Diagnosis not present

## 2023-08-08 DIAGNOSIS — M4640 Discitis, unspecified, site unspecified: Secondary | ICD-10-CM | POA: Diagnosis not present

## 2023-08-08 NOTE — Telephone Encounter (Signed)
I spoke the the patient and the home health nurse Loraine Leriche and verbal orders given to remove picc per Dr. Drue Second.  Patient advised to start doxy that has already been sent to the pharmacy for her. Patient verbalized understanding.  I have sent a message to our pharmacy team and [facility] as well with the orders.  Aryelle Figg Jonathon Resides, CMA

## 2023-08-09 ENCOUNTER — Encounter: Payer: Self-pay | Admitting: Physical Therapy

## 2023-08-09 ENCOUNTER — Ambulatory Visit: Payer: BC Managed Care – PPO | Admitting: Physical Therapy

## 2023-08-09 DIAGNOSIS — R262 Difficulty in walking, not elsewhere classified: Secondary | ICD-10-CM

## 2023-08-09 DIAGNOSIS — M5459 Other low back pain: Secondary | ICD-10-CM

## 2023-08-09 DIAGNOSIS — M4640 Discitis, unspecified, site unspecified: Secondary | ICD-10-CM | POA: Diagnosis not present

## 2023-08-09 DIAGNOSIS — M6281 Muscle weakness (generalized): Secondary | ICD-10-CM

## 2023-08-09 NOTE — Therapy (Signed)
OUTPATIENT PHYSICAL THERAPY THORACOLUMBAR TREATMENT       Patient Name: Susan [name] MRN: [id number] DOB:23-Mar-1976, 47 y.o., female Today's Date: 08/09/2023  END OF SESSION:  PT End of Session - 08/09/23 1541     Visit Number 12    Number of Visits 24    Date for PT Re-Evaluation 09/20/23    Authorization Type [employer] 40 VL    Authorization - Visit Number 12    Authorization - Number of Visits 40    Progress Note Due on Visit 21    PT Start Time 1521    PT Stop Time 1600    PT Time Calculation (min) 39 min    Activity Tolerance Patient tolerated treatment well    Behavior During Therapy WFL for tasks assessed/performed                 Past Medical History:  Diagnosis Date   Abnormal pap 02/2003   CIN 2-3   Basal cell carcinoma of back 07/22/2021   GERD (gastroesophageal reflux disease)    Hyperlipidemia    Migraine with aura    Past Surgical History:  Procedure Laterality Date   BREAST BIOPSY Left 08/04/2022   [place] LT BREAST BX W LOC DEV 1ST LESION IMG BX SPEC US GUIDE 08/04/2022 [facility]   CERVICAL BIOPSY  W/ LOOP ELECTRODE EXCISION  02/2003   CIN 2-3   CHOLECYSTECTOMY  2015   COSMETIC SURGERY  2009   TEE WITHOUT CARDIOVERSION N/A 06/18/2023   Procedure: TRANSESOPHAGEAL ECHOCARDIOGRAM;  Surgeon: Wendall Stade, MD;  Location: [facility];  Service: Cardiovascular;  Laterality: N/A;   Patient Active Problem List   Diagnosis Date Noted   Malnutrition of moderate degree 06/19/2023   Staphylococcus aureus infection 06/19/2023   Bacteremia with signs of infection 06/18/2023   Discitis 06/15/2023   Right lateral epicondylitis 04/12/2023   Plantar fasciitis of left foot 08/17/2022   Ascending aorta dilatation -- found on CT Sep 2023 --> Recheck CT in Sep 2024 05/31/2022   Chest wall pain 04/20/2022   Traumatic hematoma of lower leg, left, initial encounter 11/03/2021   Basal cell carcinoma of back 07/22/2021   Dermatofibroma  07/22/2021   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   History of malignant neoplasm of skin 07/22/2021   Lentigo 07/22/2021   Melanocytic nevi of trunk 07/22/2021   Neoplasm of uncertain behavior of skin 07/22/2021   Other skin changes due to chronic exposure to nonionizing radiation 07/22/2021   Skin tag 07/22/2021   Low back pain 08/03/2020   Nonallopathic lesion of sacral region 08/03/2020   Nonallopathic lesion of thoracic region 08/03/2020   Nonallopathic lesion of lumbar region 08/03/2020   Class 1 obesity due to excess calories without serious comorbidity with body mass index (BMI) of 33.0 to 33.9 in adult 11/17/2017   Hypercholesterolemia, not on statin 08/18/2017   H/O abnormal cervical Papanicolaou smear 01/06/2013    PCP: Jarold Motto, PA   REFERRING PROVIDER: Jarold Motto, PA   REFERRING DIAG: [phone] (ICD-10-CM) - Discitis of lumbosacral region   Rationale for Evaluation and Treatment: Rehabilitation  THERAPY DIAG:  Other low back pain  Difficulty in walking, not elsewhere classified  Muscle weakness (generalized)  ONSET DATE: 05/29/23 fall with subsequent osteomyelitis L5/S1  SUBJECTIVE:  SUBJECTIVE STATEMENT: 08/09/2023 States that she got her picc line out. States that her skin is irritated.  Eval: Patient presents with husband and is standing during evaluation secondary to high levels of pain and concern.  Patient unsure if she can even lay down on table for fear she would not be able to get back up due to pain  Patient for started having pain on 9 2 of this year and her left hip where she saw a doctor who gave her some medications.  She then went abroad to [place] where she fell during a hike and went to a physician overseas.  He gave her an injection of medications.  When she  came back she saw another doctor as she was having more pain and MRI demonstrated osteomyelitis at L5-S1. She was instructed to go to the ER and was subsequently admitted to hospital from 06/15/23 - 06/20/23 for discitis, bacteremia, staphylococcus aureus infection. Given IV antibiotics. Repeat blood cultures negative.  Patient was taking gabapentin, naproxen, ibuprofen and oxy but has run out of [place] and her GABA prescription is almost up currently she is in severe pain and concerned for her quality of life once her GABA prescription runs up.  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  She is currently having severe pain with all activities movements and can barely get comfortable side-lying is most comfortable but she tosses and turns she has not been on her stomach.  It feels like she gets stuck after sitting for a while and cannot get upright and everything just locks up on her.  She has never had pain like this before.  Patient also reports that her middle finger was also possibly injured on the right hand during a transfer at the hospital it is swollen and painful.  Her right knee is also bothering her and she has noticed numbness in her right great toe it used to be in her heel but now is primarily just in her great toe.  PERTINENT HISTORY:  Migraines, GERD  PAIN:  Are you having pain? Yes: NPRS scale: 0/10 Pain location: back and R calf  Pain description: aching and throbbing Aggravating factors: Movement sitting transitional movements Relieving factors: Medication  PRECAUTIONS: Fall  RED FLAGS: None   WEIGHT BEARING RESTRICTIONS: No  FALLS:  Has patient fallen in last 6 months? Yes. 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  DIAGNOSTIC FINDINGS:  Lumbar MRI 06/15/2023    IMPRESSION: 1. Positive for Discitis Osteomyelitis at L5-S1. Pronounced epidural inflammation there, and small sacral epidural abscess tracking within the right posterior and lateral sacral epidural space from S1 through at least S3, involving the exiting right sacral nerve roots. Subsequent mild to moderate L5-S1 spinal stenosis, in part due to dural thickening and enhancement. And bilateral L5 neural foraminal stenosis and foraminal inflammation.   2. No paraspinal muscle abscess.  No other lumbar levels involved.    PATIENT SURVEYS:  FOTO 9%  SCREENING FOR RED FLAGS: Bowel or bladder incontinence: No Spinal tumors:  No Cauda equina syndrome: No Compression fracture: No Abdominal aneurysm: No  COGNITION: Overall cognitive status: Within functional limits for tasks assessed     SENSATION: Not tested    POSTURE: Slumped to forward posture with heavy use hands on rolling walker  PALPATION: No tenderness to palpation noted in lumbar musculature or glutes  LUMBAR ROM: Unable to formally test secondary to high levels of pain  AROM   Flexion   Extension   Right lateral flexion   Left lateral flexion   Right rotation   Left rotation    (Blank rows = not tested)    LE Measurements unable to formally test secondary to high levels of pain Lower Extremity Right  Left    A/PROM MMT A/PROM MMT  Hip Flexion      Hip Extension      Hip Abduction      Hip Adduction      Hip Internal rotation      Hip External rotation      Knee Flexion      Knee Extension      Ankle Dorsiflexion      Ankle Plantarflexion      Ankle Inversion      Ankle Eversion       (Blank rows = not tested) * pain'   LUMBAR SPECIAL  TESTS:  Unable to test secondary to high levels of pain FUNCTIONAL TESTS:  Sit to stand: Slow labored movements patient holding breath with increased pain strong use of arms. Unable to perform any bed mobilities today secondary to pain, fear of pain and fear of not being able to get up Stand to sit 3 attempts with elevated height of chair and strong use of her arms very painful slow-labored movements  GAIT: Distance walked: 25 feet in clinic Assistive device utilized: Walker - 2 wheeled Level of assistance: SBA Comments: Labored gait with a rolling walker, excessive weight on upper extremities, wide base of support, antalgic  TODAY'S TREATMENT:                                                                                                                              DATE:   08/09/2023  Therapeutic Exercise: Prone:    Seated:STS 16 inch height 2x10 total - slow decent ,   S/l: clams 3x5 B slow and controlled, rev clamshells 2x10 B  Standing: FORWARD STEPS 6" 2X10 B, side steps ups 6" 3x5 B Neuromuscular Re-education: Manual Therapy:   Therapeutic Activity:   Self Care: on basic wound care advise - gentle gauze wrap without tape on skin to reduce irritation - on keeping wound covered to reduced risk of irritation and promote healing. Trigger Point Dry Needling:  Modalities:     PATIENT EDUCATION:  Education details: on HEP, and rationale behind interventions Person educated: Patient Education method: Explanation, Demonstration, and Handouts Education comprehension: verbalized understanding   HOME EXERCISE PROGRAM: [id number]  ASSESSMENT:  CLINICAL IMPRESSION: 08/09/2023  Continued to progress exercises. Cues for form. No pain but fatigued quickly with new exercises. Added new exercises to HEP. Overall patient is doing much better but continues to demonstrate weakness. Will continue to benefit from skilled PT at this time.    Eval: Patient presents to physical therapy  with complaints of severe low back pain after a fall resulting in osteomyelitis of L5-S1.  Patient has been treated with a course of antibiotics in the hospital and blood work has come back negative.  She presents with use of rolling walker and severe pain with all movements and transitional movements.  Session limited on objective information today secondary to high levels of pain.  Primary goal of today's session was to reassure patient and her family and give pain management strategies to help cope with the current pain levels.  Patient is greatly limited in function due to pain in range of motion and strength deficits and would greatly benefit from skilled PT at this time.  OBJECTIVE IMPAIRMENTS: Abnormal gait, decreased activity tolerance, decreased balance, decreased coordination, decreased endurance, decreased knowledge of use of DME, decreased mobility, difficulty walking, decreased ROM, decreased strength, improper body mechanics, postural dysfunction, and pain.   ACTIVITY LIMITATIONS: carrying, lifting, bending, sitting, standing, squatting, sleeping, stairs, transfers, bed mobility, bathing, toileting, dressing, hygiene/grooming, and locomotion level  PARTICIPATION LIMITATIONS: meal prep, cleaning, laundry, driving, shopping, community activity, and occupation  PERSONAL FACTORS: Age, Fitness, and 1 comorbidity: Osteomyelitis L5-S1  are also affecting patient's functional outcome.   REHAB POTENTIAL: Good  CLINICAL DECISION MAKING: Evolving/moderate complexity  EVALUATION COMPLEXITY: Moderate   GOALS: Goals reviewed with patient? yes  SHORT TERM GOALS: Target date: 08/10/2023 Patient will be independent in self management strategies to improve quality of life and functional outcomes. Baseline: New Program Goal status: PROGRESSING  2.  Patient will report at least 25 % improvement in overall symptoms and/or function to demonstrate improved functional mobility Baseline: 0%  better Goal status: MET  3.  Patient will be able to transition from sit to stand and stand to sit from a normal height chair without the use of her arms Baseline: Unable and very painful Goal status: MET  4.  Patient will be able to walk for 15 minutes with use of assistive device as needed without severe pain to improve ambulatory mobility in the home Baseline: Requires walker 5 to 10 minutes max with severe pain afterwards Goal status: MET    LONG TERM GOALS: Target date: 09/20/2023 Patient will report at least 50 % improvement in overall symptoms and/or function to demonstrate improved functional mobility Baseline: 0% better Goal status: MET  2.  Patient will improve score on FOTO outcomes measure to projected score to demonstrate overall improved function and QOL Baseline: see above Goal status: MET  3.  Patient will be able to walk up to 30 minutes without the use of a rolling walker and without severe pain to improve overall function and return to prior level of function Baseline: 5 to 10 minutes with rolling walker painful Goal status: MET  4.  Patient will be able to sit for at least 8 hours with intermittent standing without severe pain at the end of the dayto be able to perform job tasks Baseline: Currently 5 hours but severe pain afterwards Goal status: PROGRESSING - current 1 hour   PLAN:  PT FREQUENCY: 2x/week  PT DURATION: 12 weeks  PLANNED INTERVENTIONS: Therapeutic exercises, Therapeutic activity, Neuromuscular re-education, Balance training, Gait training, Patient/Family education, Self Care, Joint  mobilization, Joint manipulation, Stair training, Vestibular training, Canalith repositioning, Orthotic/Fit training, Prosthetic training, DME instructions, Aquatic Therapy, Dry Needling, Electrical stimulation, Spinal manipulation, Spinal mobilization, Cryotherapy, Moist heat, Taping, Traction, Ultrasound, Ionotophoresis 4mg /ml Dexamethasone, Manual therapy, and  Re-evaluation.   PLAN FOR NEXT SESSION: no bending/lifting/twisting,standing and progress exercises as tolerated  4:01 PM, 08/09/23 Tereasa Coop, DPT Physical Therapy with [facility]

## 2023-08-10 DIAGNOSIS — M4640 Discitis, unspecified, site unspecified: Secondary | ICD-10-CM | POA: Diagnosis not present

## 2023-08-10 NOTE — Progress Notes (Incomplete)
Susan [name] is a 47 y.o. female and is here for a comprehensive physical exam.  HPI There are no preventive care reminders to display for this patient. No chief complaint on file.  Acute Concerns: {ExamConcerns:31114}  Chronic Issues: Insomnia: Managed with 100 mg Trazadone nightly.   Denies SI/HI   Health Maintenance: Immunizations -- *** Endoscopy -- Last done 10/13/20. 2 polyps removed (1 found to be pre-cancerous), otherwise normal. Repeat 2029. Mammogram -- Last done 07/26/23. Results were normal. Repeat 2025. PAP -- Last done 07/21/20. Results were normal. Repeat 2026. Bone Density -- N/A Diet -- {CPE Diet/Exercise:30649} Exercise -- {CPE Diet/Exercise:30649}  Sleep habits -- {CPE Mood/Sleep:30650} Mood -- {CPE Mood/Sleep:30650}  UTD with dentist? - {Eye Doctor/Dentist:30651} UTD with eye doctor? - {Eye Doctor/Dentist:30651} Established/UTD with dermatology? - {Opthamology/Dentistry/Dermatology:30651}  Weight history: Wt Readings from Last 10 Encounters:  08/02/23 203 lb (92.1 kg)  07/05/23 203 lb (92.1 kg)  06/27/23 205 lb (93 kg)  06/15/23 208 lb (94.3 kg)  04/12/23 228 lb (103.4 kg)  02/01/23 228 lb (103.4 kg)  12/22/22 220 lb (99.8 kg)  12/07/22 226 lb (102.5 kg)  09/28/22 225 lb (102.1 kg)  08/17/22 215 lb (97.5 kg)   There is no height or weight on file to calculate BMI. Patient's last menstrual period was 07/01/2023 (approximate).  Alcohol use:  reports current alcohol use of about 2.0 standard drinks of alcohol per week.  Tobacco use:  Tobacco Use: Low Risk  (08/09/2023)   Patient History    Smoking Tobacco Use: Never    Smokeless Tobacco Use: Never    Passive Exposure: Not on file   Eligible for lung cancer screening? ***     08/02/2023    2:31 PM  Depression screen PHQ 2/9  Decreased Interest 0  Down, Depressed, Hopeless 0  PHQ - 2 Score 0    Other providers/specialists: Patient Care Team: Jarold Motto, [name] as PCP -  General (Physician Assistant)   PMHx, SurgHx, SocialHx, Medications, and Allergies were reviewed in the Visit Navigator and updated as appropriate.   Past Medical History:  Diagnosis Date   Abnormal pap 02/2003   CIN 2-3   Basal cell carcinoma of back 07/22/2021   GERD (gastroesophageal reflux disease)    Hyperlipidemia    Migraine with aura     Past Surgical History:  Procedure Laterality Date   BREAST BIOPSY Left 08/04/2022   [place] LT BREAST BX W LOC DEV 1ST LESION IMG BX SPEC US GUIDE 08/04/2022 [facility]   CERVICAL BIOPSY  W/ LOOP ELECTRODE EXCISION  02/2003   CIN 2-3   CHOLECYSTECTOMY  2015   COSMETIC SURGERY  2009   TEE WITHOUT CARDIOVERSION N/A 06/18/2023   Procedure: TRANSESOPHAGEAL ECHOCARDIOGRAM;  Surgeon: Wendall Stade, MD;  Location: [facility];  Service: Cardiovascular;  Laterality: N/A;   Family History  Problem Relation Age of Onset   HIV Mother    Other Mother    Early death Mother        Age 31   Hypertension Father    Depression Father        Suicide   Early death Brother        Age 50   Drug abuse Brother    Hypertension Maternal Grandmother    Diabetes Maternal Grandmother    Stroke Maternal Grandmother    Heart failure Maternal Grandfather    Heart disease Maternal Grandfather    Diabetes Paternal Grandmother    Depression Paternal Grandfather  Suicide   Colon cancer Neg Hx    Colon polyps Neg Hx    Esophageal cancer Neg Hx    Rectal cancer Neg Hx    Stomach cancer Neg Hx    Social History   Tobacco Use   Smoking status: Never   Smokeless tobacco: Never  Vaping Use   Vaping status: Never Used  Substance Use Topics   Alcohol use: Yes    Alcohol/week: 2.0 standard drinks of alcohol    Types: 2 Glasses of wine per week    Comment: 1-2 glasses/week   Drug use: No   Review of Systems:   ROS See pertinent positives and negatives as per the HPI.  Objective:   LMP 07/01/2023 (Approximate)  There is no height or  weight on file to calculate BMI.   General Appearance:    Alert, cooperative, no distress, appears stated age  Head:    Normocephalic, without obvious abnormality, atraumatic  Eyes:    PERRL, conjunctiva/corneas clear, EOM's intact, fundi    benign, both eyes  Ears:    Normal TM's and external ear canals, both ears  Nose:   Nares normal, septum midline, mucosa normal, no drainage    or sinus tenderness  Throat:   Lips, mucosa, and tongue normal; teeth and gums normal  Neck:   Supple, symmetrical, trachea midline, no adenopathy;    thyroid:  no enlargement/tenderness/nodules; no carotid   bruit or JVD  Back:     Symmetric, no curvature, ROM normal, no CVA tenderness  Lungs:     Clear to auscultation bilaterally, respirations unlabored  Chest Wall:    No tenderness or deformity   Heart:    Regular rate and rhythm, S1 and S2 normal, no murmur, rub or gallop  Breast Exam:    ***No tenderness, masses, or nipple abnormality  Abdomen:     Soft, non-tender, bowel sounds active all four quadrants,    no masses, no organomegaly  Genitalia:    ***Normal female without lesion, discharge or tenderness  Extremities:   Extremities normal, atraumatic, no cyanosis or edema  Pulses:   2+ and symmetric all extremities  Skin:   Skin color, texture, turgor normal, no rashes or lesions  Lymph nodes:   Cervical, supraclavicular, and axillary nodes normal  Neurologic:   CNII-XII intact, normal strength, sensation and reflexes    throughout    Assessment/Plan:   There are no diagnoses linked to this encounter.          I,Emily Lagle,acting as a Neurosurgeon for [employer], PA.,have documented all relevant documentation on the behalf of Jarold Motto, PA,as directed by  Jarold Motto, PA while in the presence of Jarold Motto, [name].  *** (refresh reminder)  I, Jarold Motto, PA, have reviewed all documentation for this visit. The documentation on 08/10/23 for the exam, diagnosis, procedures,  and orders are all accurate and complete.  Jarold Motto, PA-C [facility]

## 2023-08-13 ENCOUNTER — Encounter: Payer: Self-pay | Admitting: Physician Assistant

## 2023-08-13 ENCOUNTER — Ambulatory Visit (INDEPENDENT_AMBULATORY_CARE_PROVIDER_SITE_OTHER): Payer: BC Managed Care – PPO | Admitting: Physical Therapy

## 2023-08-13 ENCOUNTER — Encounter: Payer: Self-pay | Admitting: Physical Therapy

## 2023-08-13 ENCOUNTER — Ambulatory Visit (INDEPENDENT_AMBULATORY_CARE_PROVIDER_SITE_OTHER): Payer: BC Managed Care – PPO | Admitting: Physician Assistant

## 2023-08-13 VITALS — BP 120/84 | HR 67 | Temp 98.0°F | Ht 68.0 in | Wt 203.0 lb

## 2023-08-13 DIAGNOSIS — M5459 Other low back pain: Secondary | ICD-10-CM | POA: Diagnosis not present

## 2023-08-13 DIAGNOSIS — R932 Abnormal findings on diagnostic imaging of liver and biliary tract: Secondary | ICD-10-CM | POA: Diagnosis not present

## 2023-08-13 DIAGNOSIS — Z0001 Encounter for general adult medical examination with abnormal findings: Secondary | ICD-10-CM

## 2023-08-13 DIAGNOSIS — M4647 Discitis, unspecified, lumbosacral region: Secondary | ICD-10-CM | POA: Diagnosis not present

## 2023-08-13 DIAGNOSIS — M6281 Muscle weakness (generalized): Secondary | ICD-10-CM

## 2023-08-13 DIAGNOSIS — Z0000 Encounter for general adult medical examination without abnormal findings: Secondary | ICD-10-CM

## 2023-08-13 DIAGNOSIS — R7989 Other specified abnormal findings of blood chemistry: Secondary | ICD-10-CM

## 2023-08-13 DIAGNOSIS — R262 Difficulty in walking, not elsewhere classified: Secondary | ICD-10-CM | POA: Diagnosis not present

## 2023-08-13 DIAGNOSIS — E669 Obesity, unspecified: Secondary | ICD-10-CM

## 2023-08-13 LAB — COMPREHENSIVE METABOLIC PANEL
ALT: 14 U/L (ref 0–35)
AST: 24 U/L (ref 0–37)
Albumin: 4.4 g/dL (ref 3.5–5.2)
Alkaline Phosphatase: 115 U/L (ref 39–117)
BUN: 15 mg/dL (ref 6–23)
CO2: 27 meq/L (ref 19–32)
Calcium: 9.4 mg/dL (ref 8.4–10.5)
Chloride: 102 meq/L (ref 96–112)
Creatinine, Ser: 0.89 mg/dL (ref 0.40–1.20)
GFR: 76.65 mL/min (ref >60.00)
Glucose, Bld: 103 mg/dL — ABNORMAL HIGH (ref 70–99)
Potassium: 3.9 meq/L (ref 3.5–5.1)
Sodium: 138 meq/L (ref 135–145)
Total Bilirubin: 0.4 mg/dL (ref 0.2–1.2)
Total Protein: 7.3 g/dL (ref 6.0–8.3)

## 2023-08-13 LAB — CBC WITH DIFFERENTIAL/PLATELET
Basophils Absolute: 0.1 10*3/uL (ref 0.0–0.1)
Basophils Relative: 0.9 % (ref 0.0–3.0)
Eosinophils Absolute: 0.1 10*3/uL (ref 0.0–0.7)
Eosinophils Relative: 1.1 % (ref 0.0–5.0)
HCT: 34.8 % — ABNORMAL LOW (ref 36.0–46.0)
Hemoglobin: 11.3 g/dL — ABNORMAL LOW (ref 12.0–15.0)
Lymphocytes Relative: 25.6 % (ref 12.0–46.0)
Lymphs Abs: 2.1 10*3/uL (ref 0.7–4.0)
MCHC: 32.6 g/dL (ref 30.0–36.0)
MCV: 86.8 fL (ref 78.0–100.0)
Monocytes Absolute: 0.6 10*3/uL (ref 0.1–1.0)
Monocytes Relative: 6.7 % (ref 3.0–12.0)
Neutro Abs: 5.4 10*3/uL (ref 1.4–7.7)
Neutrophils Relative %: 65.7 % (ref 43.0–77.0)
Platelets: 411 10*3/uL — ABNORMAL HIGH (ref 150.0–400.0)
RBC: 4.01 Mil/uL (ref 3.87–5.11)
RDW: 16.5 % — ABNORMAL HIGH (ref 11.5–15.5)
WBC: 8.3 10*3/uL (ref 4.0–10.5)

## 2023-08-13 LAB — LIPID PANEL
Cholesterol: 249 mg/dL — ABNORMAL HIGH (ref 0–200)
HDL: 52.1 mg/dL (ref >39.00)
LDL Cholesterol: 158 mg/dL — ABNORMAL HIGH (ref 0–99)
NonHDL: 196.67
Total CHOL/HDL Ratio: 5
Triglycerides: 194 mg/dL — ABNORMAL HIGH (ref 0.0–149.0)
VLDL: 38.8 mg/dL (ref 0.0–40.0)

## 2023-08-13 NOTE — Progress Notes (Signed)
Subjective:    Susan [name] is a 47 y.o. female and is here for a comprehensive physical exam.  HPI  There are no preventive care reminders to display for this patient.  Acute Concerns: Abnormal ultrasound - recent hospitalization showed quite elevated liver labs with Alk Phos -- 441, AST -- 141, ALT -- 197  Labs have improved however she had ultrasound of her liver done in hospital on 06/15/23 that showed "Mild nodular contour to the liver may indicate cirrhosis." She denies abdominal pain. She is currently drinking 1-2 glasses of wine per week. She is not taking tylenol. She has continued to hold her statin  Chronic Issues: Discitis of low back Continues to see physical therapy and sports medicine for her issues She has decreased her gabapentin to 300 mg in am She does continue diclofenac 75 mg twice daily but is not sure its providing much benefit Per ID, she continues on doxycycline 100 mg twice daily  Has repeat MRI next month  Health Maintenance: Immunizations -- utd Colonoscopy -- utd Mammogram -- utd PAP -- utd Bone Density -- n/a Diet -- working on healthy diet as able Exercise -- limited due to pain  Sleep habits -- sometimes has issues with sleep -- will take trazodone as needed  Mood -- overall stable  UTD with dentist? - completed UTD with eye doctor? - completed  Weight history: Wt Readings from Last 10 Encounters:  08/13/23 203 lb (92.1 kg)  08/02/23 203 lb (92.1 kg)  07/05/23 203 lb (92.1 kg)  06/27/23 205 lb (93 kg)  06/15/23 208 lb (94.3 kg)  04/12/23 228 lb (103.4 kg)  02/01/23 228 lb (103.4 kg)  12/22/22 220 lb (99.8 kg)  12/07/22 226 lb (102.5 kg)  09/28/22 225 lb (102.1 kg)   Body mass index is 30.87 kg/m. Patient's last menstrual period was 05/19/2023.  Alcohol use:  reports current alcohol use of about 1.0 standard drink of alcohol per week.  Tobacco use:  Tobacco Use: Low Risk  (08/13/2023)   Patient History    Smoking  Tobacco Use: Never    Smokeless Tobacco Use: Never    Passive Exposure: Not on file   Eligible for lung cancer screening? No     08/13/2023    1:08 PM  Depression screen PHQ 2/9  Decreased Interest 0  Down, Depressed, Hopeless 0  PHQ - 2 Score 0  Altered sleeping 0  Tired, decreased energy 1  Change in appetite 0  Feeling bad or failure about yourself  0  Trouble concentrating 0  Moving slowly or fidgety/restless 0  Suicidal thoughts 0  PHQ-9 Score 1  Difficult doing work/chores Not difficult at all     Other providers/specialists: Patient Care Team: Jarold Motto, [name] as PCP - General (Physician Assistant)    PMHx, SurgHx, SocialHx, Medications, and Allergies were reviewed in the Visit Navigator and updated as appropriate.   Past Medical History:  Diagnosis Date   Abnormal pap 02/2003   CIN 2-3   Basal cell carcinoma of back 07/22/2021   GERD (gastroesophageal reflux disease)    Hyperlipidemia    Migraine with aura      Past Surgical History:  Procedure Laterality Date   BREAST BIOPSY Left 08/04/2022   [place] LT BREAST BX W LOC DEV 1ST LESION IMG BX SPEC US GUIDE 08/04/2022 [facility]   CERVICAL BIOPSY  W/ LOOP ELECTRODE EXCISION  02/2003   CIN 2-3   CHOLECYSTECTOMY  2015   COSMETIC SURGERY  2009   TEE WITHOUT CARDIOVERSION N/A 06/18/2023   Procedure: TRANSESOPHAGEAL ECHOCARDIOGRAM;  Surgeon: Wendall Stade, MD;  Location: [facility] INVASIVE CV LAB;  Service: Cardiovascular;  Laterality: N/A;     Family History  Problem Relation Age of Onset   HIV Mother    Other Mother    Early death Mother        Age 71   Hypertension Father    Depression Father        Suicide   Early death Brother        Age 9   Drug abuse Brother    Hypertension Maternal Grandmother    Diabetes Maternal Grandmother    Stroke Maternal Grandmother    Heart failure Maternal Grandfather    Heart disease Maternal Grandfather    Diabetes Paternal Grandmother    Depression  Paternal Grandfather        Suicide   Colon cancer Neg Hx    Colon polyps Neg Hx    Esophageal cancer Neg Hx    Rectal cancer Neg Hx    Stomach cancer Neg Hx     Social History   Tobacco Use   Smoking status: Never   Smokeless tobacco: Never  Vaping Use   Vaping status: Never Used  Substance Use Topics   Alcohol use: Yes    Alcohol/week: 1.0 standard drink of alcohol    Types: 1 Glasses of wine per week    Comment: 1-2 glasses/week   Drug use: No    Review of Systems:   Review of Systems  Constitutional:  Negative for chills, fever, malaise/fatigue and weight loss.  HENT:  Negative for hearing loss, sinus pain and sore throat.   Respiratory:  Negative for cough and hemoptysis.   Cardiovascular:  Negative for chest pain, palpitations, leg swelling and PND.  Gastrointestinal:  Negative for abdominal pain, constipation, diarrhea, heartburn, nausea and vomiting.  Genitourinary:  Negative for dysuria, frequency and urgency.  Musculoskeletal:  Negative for back pain, myalgias and neck pain.  Skin:  Negative for itching and rash.  Neurological:  Negative for dizziness, tingling, seizures and headaches.  Endo/Heme/Allergies:  Negative for polydipsia.  Psychiatric/Behavioral:  Negative for depression. The patient is not nervous/anxious.     Objective:   BP 120/84 (BP Location: Left Arm, Patient Position: Sitting, Cuff Size: Large)   Pulse 67   Temp 98 F (36.7 C) (Temporal)   Ht 5\' 8"  (1.727 m)   Wt 203 lb (92.1 kg)   LMP 05/19/2023   SpO2 99%   BMI 30.87 kg/m  Body mass index is 30.87 kg/m.   General Appearance:    Alert, cooperative, no distress, appears stated age  Head:    Normocephalic, without obvious abnormality, atraumatic  Eyes:    PERRL, conjunctiva/corneas clear, EOM's intact, fundi    benign, both eyes  Ears:    Normal TM's and external ear canals, both ears  Nose:   Nares normal, septum midline, mucosa normal, no drainage    or sinus tenderness   Throat:   Lips, mucosa, and tongue normal; teeth and gums normal  Neck:   Supple, symmetrical, trachea midline, no adenopathy;    thyroid:  no enlargement/tenderness/nodules; no carotid   bruit or JVD  Back:     Symmetric, no curvature, ROM normal, no CVA tenderness  Lungs:     Clear to auscultation bilaterally, respirations unlabored  Chest Wall:    No tenderness or deformity   Heart:    Regular  rate and rhythm, S1 and S2 normal, no murmur, rub or gallop  Breast Exam:    Deferred  Abdomen:     Soft, non-tender, bowel sounds active all four quadrants,    no masses, no organomegaly  Genitalia:    Deferred  Extremities:   Extremities normal, atraumatic, no cyanosis or edema  Pulses:   2+ and symmetric all extremities  Skin:   Skin color, texture, turgor normal, no rashes or lesions  Lymph nodes:   Cervical, supraclavicular, and axillary nodes normal  Neurologic:   CNII-XII intact, normal strength, sensation and reflexes    throughout    Assessment/Plan:   Routine physical examination Today patient counseled on age appropriate routine health concerns for screening and prevention, each reviewed and up to date or declined. Immunizations reviewed and up to date or declined. Labs ordered and reviewed. Risk factors for depression reviewed and negative. Hearing function and visual acuity are intact. ADLs screened and addressed as needed. Functional ability and level of safety reviewed and appropriate. Education, counseling and referrals performed based on assessed risks today. Patient provided with a copy of personalized plan for preventive services.  Abnormal liver ultrasound Will refer to gastroenterology for further evaluation  Elevated LFTs Recheck LFTs today Continue to hold statin per patient preference She was instructed to hold tylenol and statin Recommend avoid all alcohol Gastroenterology referral If new abdominal pain - reach out to our office  Discitis of lumbosacral  region Ongoing Recommend continued tapering of gabapentin as able She may also stop the diclofenac to see if this is providing benefit -- and if not, may stop completely or resume -- did encourage her to continue pantoprazole daily  Obesity, unspecified class, unspecified obesity type, unspecified whether serious comorbidity present Continue efforts at healthy lifestyle  Jarold Motto, PA-C [facility]

## 2023-08-13 NOTE — Therapy (Signed)
OUTPATIENT PHYSICAL THERAPY THORACOLUMBAR TREATMENT       Patient Name: Susan [name] MRN: [id number] DOB:[birth year], 47 y.o., female Today's Date: 08/13/2023  END OF SESSION:  PT End of Session - 08/13/23 1345     Visit Number 13    Number of Visits 24    Date for PT Re-Evaluation 09/20/23    Authorization Type [employer] 40 VL    Authorization - Visit Number 13    Authorization - Number of Visits 40    Progress Note Due on Visit 21    PT Start Time 1345    PT Stop Time 1425    PT Time Calculation (min) 40 min    Activity Tolerance Patient tolerated treatment well    Behavior During Therapy WFL for tasks assessed/performed                 Past Medical History:  Diagnosis Date   Abnormal pap 02/2003   CIN 2-3   Basal cell carcinoma of back 07/22/2021   GERD (gastroesophageal reflux disease)    Hyperlipidemia    Migraine with aura    Past Surgical History:  Procedure Laterality Date   BREAST BIOPSY Left 08/04/2022   [place] LT BREAST BX W LOC DEV 1ST LESION IMG BX SPEC US GUIDE 08/04/2022 [facility]   CERVICAL BIOPSY  W/ LOOP ELECTRODE EXCISION  02/2003   CIN 2-3   CHOLECYSTECTOMY  2015   COSMETIC SURGERY  2009   TEE WITHOUT CARDIOVERSION N/A 06/18/2023   Procedure: TRANSESOPHAGEAL ECHOCARDIOGRAM;  Surgeon: Wendall Stade, MD;  Location: [facility] INVASIVE CV LAB;  Service: Cardiovascular;  Laterality: N/A;   Patient Active Problem List   Diagnosis Date Noted   Malnutrition of moderate degree 06/19/2023   Staphylococcus aureus infection 06/19/2023   Bacteremia with signs of infection 06/18/2023   Discitis 06/15/2023   Right lateral epicondylitis 04/12/2023   Plantar fasciitis of left foot 08/17/2022   Ascending aorta dilatation -- found on CT Sep 2023 --> Recheck CT in Sep 2024 05/31/2022   Chest wall pain 04/20/2022   Traumatic hematoma of lower leg, left, initial encounter 11/03/2021   Basal cell carcinoma of back 07/22/2021   Dermatofibroma  07/22/2021   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   History of malignant neoplasm of skin 07/22/2021   Lentigo 07/22/2021   Melanocytic nevi of trunk 07/22/2021   Neoplasm of uncertain behavior of skin 07/22/2021   Other skin changes due to chronic exposure to nonionizing radiation 07/22/2021   Skin tag 07/22/2021   Low back pain 08/03/2020   Nonallopathic lesion of sacral region 08/03/2020   Nonallopathic lesion of thoracic region 08/03/2020   Nonallopathic lesion of lumbar region 08/03/2020   Class 1 obesity due to excess calories without serious comorbidity with body mass index (BMI) of 33.0 to 33.9 in adult 11/17/2017   Hypercholesterolemia, not on statin 08/18/2017   H/O abnormal cervical Papanicolaou smear 01/06/2013    PCP: Jarold Motto, PA   REFERRING PROVIDER: Jarold Motto, PA   REFERRING DIAG: [phone] (ICD-10-CM) - Discitis of lumbosacral region   Rationale for Evaluation and Treatment: Rehabilitation  THERAPY DIAG:  Other low back pain  Difficulty in walking, not elsewhere classified  Muscle weakness (generalized)  ONSET DATE: 05/29/23 fall with subsequent osteomyelitis L5/S1  SUBJECTIVE:  SUBJECTIVE STATEMENT: 08/13/2023 States she is doing well. States that she got her labs drawn today and they are still concerned about her liver. States she thinks she overdid her exercises on Friday and was really sore on Saturday.  Eval: Patient presents with husband and is standing during evaluation secondary to high levels of pain and concern.  Patient unsure if she can even lay down on table for fear she would not be able to get back up due to pain  Patient for started having pain on 9 2 of this year and her left hip where she saw a doctor who gave her some medications.  She then went abroad to  [place] where she fell during a hike and went to a physician overseas.  He gave her an injection of medications.  When she came back she saw another doctor as she was having more pain and MRI demonstrated osteomyelitis at L5-S1. She was instructed to go to the ER and was subsequently admitted to hospital from 06/15/23 - 06/20/23 for discitis, bacteremia, staphylococcus aureus infection. Given IV antibiotics. Repeat blood cultures negative.  Patient was taking gabapentin, naproxen, ibuprofen and oxy but has run out of [place] and her GABA prescription is almost up currently she is in severe pain and concerned for her quality of life once her GABA prescription runs up.  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  She is currently having severe pain with all activities movements and can barely get comfortable side-lying is most comfortable but she tosses and turns she has not been on her stomach.  It feels like she gets stuck after sitting for a while and cannot get upright and everything just locks up on her.  She has never had pain like this before.  Patient also reports that her middle finger was also possibly injured on the right hand during a transfer at the hospital it is swollen and painful.  Her right knee is also bothering her and she has noticed numbness in her right great toe it used to be in her heel but now is primarily just in her great toe.  PERTINENT HISTORY:  Migraines, GERD  PAIN:  Are you having pain? Yes: NPRS scale: 0/10 Pain location: back and R calf  Pain description: aching and throbbing Aggravating factors: Movement sitting transitional movements Relieving factors: Medication  PRECAUTIONS: Fall  RED FLAGS: None   WEIGHT BEARING RESTRICTIONS: No  FALLS:  Has patient fallen in last 6 months? Yes. 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  DIAGNOSTIC FINDINGS:  Lumbar MRI 06/15/2023    IMPRESSION: 1. Positive for Discitis Osteomyelitis at L5-S1. Pronounced epidural inflammation there, and small sacral epidural abscess tracking within the right posterior and lateral sacral epidural space from S1 through at least S3, involving the exiting right sacral nerve roots. Subsequent mild to moderate L5-S1 spinal stenosis, in part due to dural thickening and enhancement. And bilateral L5 neural foraminal stenosis and foraminal inflammation.   2. No paraspinal muscle abscess.  No other lumbar  levels involved.    PATIENT SURVEYS:  FOTO 9%  SCREENING FOR RED FLAGS: Bowel or bladder incontinence: No Spinal tumors: No Cauda equina syndrome: No Compression fracture: No Abdominal aneurysm: No  COGNITION: Overall cognitive status: Within functional limits for tasks assessed     SENSATION: Not tested    POSTURE: Slumped to forward posture with heavy use hands on rolling walker  PALPATION: No tenderness to palpation noted in lumbar musculature or glutes  LUMBAR ROM: Unable to formally test secondary to high levels of pain  AROM   Flexion   Extension   Right lateral flexion   Left lateral flexion   Right rotation   Left rotation    (Blank rows = not tested)    LE Measurements unable to formally test secondary to high levels of pain Lower Extremity Right  Left    A/PROM MMT A/PROM MMT  Hip Flexion      Hip Extension      Hip Abduction      Hip Adduction      Hip Internal rotation      Hip External rotation      Knee Flexion      Knee Extension      Ankle Dorsiflexion      Ankle Plantarflexion       Ankle Inversion      Ankle Eversion       (Blank rows = not tested) * pain'   LUMBAR SPECIAL TESTS:  Unable to test secondary to high levels of pain FUNCTIONAL TESTS:  Sit to stand: Slow labored movements patient holding breath with increased pain strong use of arms. Unable to perform any bed mobilities today secondary to pain, fear of pain and fear of not being able to get up Stand to sit 3 attempts with elevated height of chair and strong use of her arms very painful slow-labored movements  GAIT: Distance walked: 25 feet in clinic Assistive device utilized: Walker - 2 wheeled Level of assistance: SBA Comments: Labored gait with a rolling walker, excessive weight on upper extremities, wide base of support, antalgic  TODAY'S TREATMENT:                                                                                                                              DATE:   08/13/2023  Therapeutic Exercise: Prone:    Seated:piriformis stretch IR/ER x2 Each 30" holds B, gentle lumbar flexion stretch x5 10" holds, goddess pose 3 minutes total - 10" holds, modified seated child's pose L/R/C with ball 3 minutes total    S/l:    Standing: side steps with anchored resistance 4x2 facing each direction red theraband 5" holds, pallof press one red band 4x5 B Neuromuscular Re-education: Manual Therapy:   Therapeutic Activity:   Self Care:   Trigger Point Dry Needling:  Modalities:     PATIENT EDUCATION:  Education details: on HEP, and rationale behind interventions, progress being made  Person  educated: Patient Education method: Explanation, Demonstration, and Handouts Education comprehension: verbalized understanding   HOME EXERCISE PROGRAM: [id number]  ASSESSMENT:  CLINICAL IMPRESSION: 08/13/2023 Able to progress exercises. Standing exercises were challenging but able to tolerate when seated exercises were performed in between standing exercises as active rest breaks. Overall  patient doing well slight achiness  sin her back end of session but this reduced with stretching. Will continue with current POC as tolerated.    Eval: Patient presents to physical therapy with complaints of severe low back pain after a fall resulting in osteomyelitis of L5-S1.  Patient has been treated with a course of antibiotics in the hospital and blood work has come back negative.  She presents with use of rolling walker and severe pain with all movements and transitional movements.  Session limited on objective information today secondary to high levels of pain.  Primary goal of today's session was to reassure patient and her family and give pain management strategies to help cope with the current pain levels.  Patient is greatly limited in function due to pain in range of motion and strength deficits and would greatly benefit from skilled PT at this time.  OBJECTIVE IMPAIRMENTS: Abnormal gait, decreased activity tolerance, decreased balance, decreased coordination, decreased endurance, decreased knowledge of use of DME, decreased mobility, difficulty walking, decreased ROM, decreased strength, improper body mechanics, postural dysfunction, and pain.   ACTIVITY LIMITATIONS: carrying, lifting, bending, sitting, standing, squatting, sleeping, stairs, transfers, bed mobility, bathing, toileting, dressing, hygiene/grooming, and locomotion level  PARTICIPATION LIMITATIONS: meal prep, cleaning, laundry, driving, shopping, community activity, and occupation  PERSONAL FACTORS: Age, Fitness, and 1 comorbidity: Osteomyelitis L5-S1  are also affecting patient's functional outcome.   REHAB POTENTIAL: Good  CLINICAL DECISION MAKING: Evolving/moderate complexity  EVALUATION COMPLEXITY: Moderate   GOALS: Goals reviewed with patient? yes  SHORT TERM GOALS: Target date: 08/10/2023 Patient will be independent in self management strategies to improve quality of life and functional outcomes. Baseline: New  Program Goal status: PROGRESSING  2.  Patient will report at least 25 % improvement in overall symptoms and/or function to demonstrate improved functional mobility Baseline: 0% better Goal status: MET  3.  Patient will be able to transition from sit to stand and stand to sit from a normal height chair without the use of her arms Baseline: Unable and very painful Goal status: MET  4.  Patient will be able to walk for 15 minutes with use of assistive device as needed without severe pain to improve ambulatory mobility in the home Baseline: Requires walker 5 to 10 minutes max with severe pain afterwards Goal status: MET    LONG TERM GOALS: Target date: 09/20/2023 Patient will report at least 50 % improvement in overall symptoms and/or function to demonstrate improved functional mobility Baseline: 0% better Goal status: MET  2.  Patient will improve score on FOTO outcomes measure to projected score to demonstrate overall improved function and QOL Baseline: see above Goal status: MET  3.  Patient will be able to walk up to 30 minutes without the use of a rolling walker and without severe pain to improve overall function and return to prior level of function Baseline: 5 to 10 minutes with rolling walker painful Goal status: MET  4.  Patient will be able to sit for at least 8 hours with intermittent standing without severe pain at the end of the dayto be able to perform job tasks Baseline: Currently 5 hours but severe pain afterwards Goal status: PROGRESSING -  current 1 hour   PLAN:  PT FREQUENCY: 2x/week  PT DURATION: 12 weeks  PLANNED INTERVENTIONS: Therapeutic exercises, Therapeutic activity, Neuromuscular re-education, Balance training, Gait training, Patient/Family education, Self Care, Joint mobilization, Joint manipulation, Stair training, Vestibular training, Canalith repositioning, Orthotic/Fit training, Prosthetic training, DME instructions, Aquatic Therapy, Dry Needling,  Electrical stimulation, Spinal manipulation, Spinal mobilization, Cryotherapy, Moist heat, Taping, Traction, Ultrasound, Ionotophoresis 4mg /ml Dexamethasone, Manual therapy, and Re-evaluation.   PLAN FOR NEXT SESSION: no bending/lifting/twisting,standing and progress exercises as tolerated  2:28 PM, 08/13/23 Tereasa Coop, DPT Physical Therapy with [facility]

## 2023-08-13 NOTE — Patient Instructions (Signed)
It was great to see you!  We will place referral for gastroenterology at this time  I will update blood work today  Limit/avoid alcohol  Follow-up in 1 year, sooner if concerns  Take care,  Jarold Motto PA-C

## 2023-08-15 ENCOUNTER — Ambulatory Visit (INDEPENDENT_AMBULATORY_CARE_PROVIDER_SITE_OTHER): Payer: BC Managed Care – PPO | Admitting: Physical Therapy

## 2023-08-15 ENCOUNTER — Encounter: Payer: Self-pay | Admitting: Physical Therapy

## 2023-08-15 DIAGNOSIS — M6281 Muscle weakness (generalized): Secondary | ICD-10-CM | POA: Diagnosis not present

## 2023-08-15 DIAGNOSIS — M5459 Other low back pain: Secondary | ICD-10-CM

## 2023-08-15 DIAGNOSIS — R262 Difficulty in walking, not elsewhere classified: Secondary | ICD-10-CM | POA: Diagnosis not present

## 2023-08-15 NOTE — Therapy (Signed)
OUTPATIENT PHYSICAL THERAPY THORACOLUMBAR TREATMENT       Patient Name: Susan [name] MRN: [id number] DOB:Sep 15, 1976, 47 y.o., female Today's Date: 08/15/2023  END OF SESSION:  PT End of Session - 08/15/23 1518     Visit Number 14    Number of Visits 24    Date for PT Re-Evaluation 09/20/23    Authorization Type [employer] 40 VL    Authorization - Visit Number 14    Authorization - Number of Visits 40    Progress Note Due on Visit 21    PT Start Time 1519    PT Stop Time 1557    PT Time Calculation (min) 38 min    Activity Tolerance Patient tolerated treatment well    Behavior During Therapy WFL for tasks assessed/performed                 Past Medical History:  Diagnosis Date   Abnormal pap 02/2003   CIN 2-3   Basal cell carcinoma of back 07/22/2021   GERD (gastroesophageal reflux disease)    Hyperlipidemia    Migraine with aura    Past Surgical History:  Procedure Laterality Date   BREAST BIOPSY Left 08/04/2022   [place] LT BREAST BX W LOC DEV 1ST LESION IMG BX SPEC US GUIDE 08/04/2022 [facility]   CERVICAL BIOPSY  W/ LOOP ELECTRODE EXCISION  02/2003   CIN 2-3   CHOLECYSTECTOMY  2015   COSMETIC SURGERY  2009   TEE WITHOUT CARDIOVERSION N/A 06/18/2023   Procedure: TRANSESOPHAGEAL ECHOCARDIOGRAM;  Surgeon: Wendall Stade, MD;  Location: [facility];  Service: Cardiovascular;  Laterality: N/A;   Patient Active Problem List   Diagnosis Date Noted   Malnutrition of moderate degree 06/19/2023   Staphylococcus aureus infection 06/19/2023   Bacteremia with signs of infection 06/18/2023   Discitis 06/15/2023   Right lateral epicondylitis 04/12/2023   Plantar fasciitis of left foot 08/17/2022   Ascending aorta dilatation -- found on CT Sep 2023 --> Recheck CT in Sep 2024 05/31/2022   Chest wall pain 04/20/2022   Traumatic hematoma of lower leg, left, initial encounter 11/03/2021   Basal cell carcinoma of back 07/22/2021   Dermatofibroma  07/22/2021   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   History of malignant neoplasm of skin 07/22/2021   Lentigo 07/22/2021   Melanocytic nevi of trunk 07/22/2021   Neoplasm of uncertain behavior of skin 07/22/2021   Other skin changes due to chronic exposure to nonionizing radiation 07/22/2021   Skin tag 07/22/2021   Low back pain 08/03/2020   Nonallopathic lesion of sacral region 08/03/2020   Nonallopathic lesion of thoracic region 08/03/2020   Nonallopathic lesion of lumbar region 08/03/2020   Class 1 obesity due to excess calories without serious comorbidity with body mass index (BMI) of 33.0 to 33.9 in adult 11/17/2017   Hypercholesterolemia, not on statin 08/18/2017   H/O abnormal cervical Papanicolaou smear 01/06/2013    PCP: Jarold Motto, PA   REFERRING PROVIDER: Jarold Motto, PA   REFERRING DIAG: [phone] (ICD-10-CM) - Discitis of lumbosacral region   Rationale for Evaluation and Treatment: Rehabilitation  THERAPY DIAG:  Other low back pain  Difficulty in walking, not elsewhere classified  Muscle weakness (generalized)  ONSET DATE: 05/29/23 fall with subsequent osteomyelitis L5/S1  SUBJECTIVE:  SUBJECTIVE STATEMENT: 08/15/2023 Reports she felt good after last session. States she is trying to bend over and it is getting easier.  Eval: Patient presents with husband and is standing during evaluation secondary to high levels of pain and concern.  Patient unsure if she can even lay down on table for fear she would not be able to get back up due to pain  Patient for started having pain on 9 2 of this year and her left hip where she saw a doctor who gave her some medications.  She then went abroad to [place] where she fell during a hike and went to a physician overseas.  He gave her an injection  of medications.  When she came back she saw another doctor as she was having more pain and MRI demonstrated osteomyelitis at L5-S1. She was instructed to go to the ER and was subsequently admitted to hospital from 06/15/23 - 06/20/23 for discitis, bacteremia, staphylococcus aureus infection. Given IV antibiotics. Repeat blood cultures negative.  Patient was taking gabapentin, naproxen, ibuprofen and oxy but has run out of [place] and her GABA prescription is almost up currently she is in severe pain and concerned for her quality of life once her GABA prescription runs up.  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  She is currently having severe pain with all activities movements and can barely get comfortable side-lying is most comfortable but she tosses and turns she has not been on her stomach.  It feels like she gets stuck after sitting for a while and cannot get upright and everything just locks up on her.  She has never had pain like this before.  Patient also reports that her middle finger was also possibly injured on the right hand during a transfer at the hospital it is swollen and painful.  Her right knee is also bothering her and she has noticed numbness in her right great toe it used to be in her heel but now is primarily just in her great toe.  PERTINENT HISTORY:  Migraines, GERD  PAIN:  Are you having pain? Yes: NPRS scale: 0/10 Pain location: back and R calf  Pain description: aching and throbbing Aggravating factors: Movement sitting transitional movements Relieving factors: Medication  PRECAUTIONS: Fall  RED FLAGS: None   WEIGHT BEARING RESTRICTIONS: No  FALLS:  Has patient fallen in last 6 months? Yes. 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  DIAGNOSTIC FINDINGS:  Lumbar MRI 06/15/2023    IMPRESSION: 1. Positive for Discitis Osteomyelitis at L5-S1. Pronounced epidural inflammation there, and small sacral epidural abscess tracking within the right posterior and lateral sacral epidural space from S1 through at least S3, involving the exiting right sacral nerve roots. Subsequent mild to moderate L5-S1 spinal stenosis, in part due to dural thickening and enhancement. And bilateral L5 neural foraminal stenosis and foraminal inflammation.   2. No paraspinal muscle abscess.  No other lumbar levels involved.    PATIENT SURVEYS:  FOTO 9%  SCREENING FOR RED FLAGS: Bowel or  bladder incontinence: No Spinal tumors: No Cauda equina syndrome: No Compression fracture: No Abdominal aneurysm: No  COGNITION: Overall cognitive status: Within functional limits for tasks assessed     SENSATION: Not tested    POSTURE: Slumped to forward posture with heavy use hands on rolling walker  PALPATION: No tenderness to palpation noted in lumbar musculature or glutes  LUMBAR ROM: Unable to formally test secondary to high levels of pain  AROM   Flexion   Extension   Right lateral flexion   Left lateral flexion   Right rotation   Left rotation    (Blank rows = not tested)    LE Measurements unable to formally test secondary to high levels of pain Lower Extremity Right  Left    A/PROM MMT A/PROM MMT  Hip Flexion      Hip Extension      Hip Abduction      Hip Adduction      Hip Internal rotation      Hip External rotation      Knee Flexion      Knee Extension      Ankle Dorsiflexion      Ankle Plantarflexion      Ankle Inversion      Ankle Eversion       (Blank rows = not tested) * pain'   LUMBAR  SPECIAL TESTS:  Unable to test secondary to high levels of pain FUNCTIONAL TESTS:  Sit to stand: Slow labored movements patient holding breath with increased pain strong use of arms. Unable to perform any bed mobilities today secondary to pain, fear of pain and fear of not being able to get up Stand to sit 3 attempts with elevated height of chair and strong use of her arms very painful slow-labored movements  GAIT: Distance walked: 25 feet in clinic Assistive device utilized: Walker - 2 wheeled Level of assistance: SBA Comments: Labored gait with a rolling walker, excessive weight on upper extremities, wide base of support, antalgic  TODAY'S TREATMENT:                                                                                                                              DATE:   08/15/2023  Therapeutic Exercise: Prone:    Seated:  S/l:    Standing:  Neuromuscular Re-education: on sit bone education in regards to posture and position, pelvic tilts supine - 5 minutes, pelvic tilts seated 5 minutes, forward reach hip hinge - 13 minutes, hip hinge with slight knee bend 15 minutes  Manual Therapy:   Therapeutic Activity:   Self Care:   Trigger Point Dry Needling:  Modalities:     PATIENT EDUCATION:  Education details: on HEP, on sit bones/ pelvic motion and bending mechanics.  Person educated: Patient Education method: Explanation, Demonstration, and Handouts Education comprehension: verbalized understanding   HOME EXERCISE PROGRAM: [id number]  ASSESSMENT:  CLINICAL IMPRESSION: 08/15/2023 Focused on movement mechanics on this date to  help with bending with reduced stress on lumbar spine. Tolerated pelvic tilts well with practice. Improved mobility noted by end of session. Added hip hinge motion in standing which was fatiguing but no pain. Added new exercises to HEP. Will continue with current POC as tolerated.    Eval: Patient presents to physical therapy with complaints  of severe low back pain after a fall resulting in osteomyelitis of L5-S1.  Patient has been treated with a course of antibiotics in the hospital and blood work has come back negative.  She presents with use of rolling walker and severe pain with all movements and transitional movements.  Session limited on objective information today secondary to high levels of pain.  Primary goal of today's session was to reassure patient and her family and give pain management strategies to help cope with the current pain levels.  Patient is greatly limited in function due to pain in range of motion and strength deficits and would greatly benefit from skilled PT at this time.  OBJECTIVE IMPAIRMENTS: Abnormal gait, decreased activity tolerance, decreased balance, decreased coordination, decreased endurance, decreased knowledge of use of DME, decreased mobility, difficulty walking, decreased ROM, decreased strength, improper body mechanics, postural dysfunction, and pain.   ACTIVITY LIMITATIONS: carrying, lifting, bending, sitting, standing, squatting, sleeping, stairs, transfers, bed mobility, bathing, toileting, dressing, hygiene/grooming, and locomotion level  PARTICIPATION LIMITATIONS: meal prep, cleaning, laundry, driving, shopping, community activity, and occupation  PERSONAL FACTORS: Age, Fitness, and 1 comorbidity: Osteomyelitis L5-S1  are also affecting patient's functional outcome.   REHAB POTENTIAL: Good  CLINICAL DECISION MAKING: Evolving/moderate complexity  EVALUATION COMPLEXITY: Moderate   GOALS: Goals reviewed with patient? yes  SHORT TERM GOALS: Target date: 08/10/2023 Patient will be independent in self management strategies to improve quality of life and functional outcomes. Baseline: New Program Goal status: PROGRESSING  2.  Patient will report at least 25 % improvement in overall symptoms and/or function to demonstrate improved functional mobility Baseline: 0% better Goal status:  MET  3.  Patient will be able to transition from sit to stand and stand to sit from a normal height chair without the use of her arms Baseline: Unable and very painful Goal status: MET  4.  Patient will be able to walk for 15 minutes with use of assistive device as needed without severe pain to improve ambulatory mobility in the home Baseline: Requires walker 5 to 10 minutes max with severe pain afterwards Goal status: MET    LONG TERM GOALS: Target date: 09/20/2023 Patient will report at least 50 % improvement in overall symptoms and/or function to demonstrate improved functional mobility Baseline: 0% better Goal status: MET  2.  Patient will improve score on FOTO outcomes measure to projected score to demonstrate overall improved function and QOL Baseline: see above Goal status: MET  3.  Patient will be able to walk up to 30 minutes without the use of a rolling walker and without severe pain to improve overall function and return to prior level of function Baseline: 5 to 10 minutes with rolling walker painful Goal status: MET  4.  Patient will be able to sit for at least 8 hours with intermittent standing without severe pain at the end of the dayto be able to perform job tasks Baseline: Currently 5 hours but severe pain afterwards Goal status: PROGRESSING - current 1 hour   PLAN:  PT FREQUENCY: 2x/week  PT DURATION: 12 weeks  PLANNED INTERVENTIONS: Therapeutic exercises, Therapeutic activity, Neuromuscular re-education, Balance training, Gait training,  Patient/Family education, Self Care, Joint mobilization, Joint manipulation, Stair training, Vestibular training, Canalith repositioning, Orthotic/Fit training, Prosthetic training, DME instructions, Aquatic Therapy, Dry Needling, Electrical stimulation, Spinal manipulation, Spinal mobilization, Cryotherapy, Moist heat, Taping, Traction, Ultrasound, Ionotophoresis 4mg /ml Dexamethasone, Manual therapy, and Re-evaluation.   PLAN  FOR NEXT SESSION: no bending/lifting/twisting,standing and progress exercises as tolerated   4:00 PM, 08/15/23 Tereasa Coop, DPT Physical Therapy with [facility]

## 2023-08-21 ENCOUNTER — Encounter: Payer: Self-pay | Admitting: Physical Therapy

## 2023-08-21 ENCOUNTER — Ambulatory Visit (INDEPENDENT_AMBULATORY_CARE_PROVIDER_SITE_OTHER): Payer: BC Managed Care – PPO | Admitting: Physical Therapy

## 2023-08-21 DIAGNOSIS — M6281 Muscle weakness (generalized): Secondary | ICD-10-CM

## 2023-08-21 DIAGNOSIS — R262 Difficulty in walking, not elsewhere classified: Secondary | ICD-10-CM | POA: Diagnosis not present

## 2023-08-21 DIAGNOSIS — M5459 Other low back pain: Secondary | ICD-10-CM | POA: Diagnosis not present

## 2023-08-21 NOTE — Therapy (Signed)
OUTPATIENT PHYSICAL THERAPY THORACOLUMBAR TREATMENT       Patient Name: Susan [name] MRN: [id number] DOB:May 05, 1976, 47 y.o., female Today's Date: 08/21/2023  END OF SESSION:  PT End of Session - 08/21/23 1515     Visit Number 15    Number of Visits 24    Date for PT Re-Evaluation 09/20/23    Authorization Type [employer] 40 VL    Authorization - Visit Number 15    Authorization - Number of Visits 40    Progress Note Due on Visit 21    PT Start Time 1516    PT Stop Time 1555    PT Time Calculation (min) 39 min    Activity Tolerance Patient tolerated treatment well    Behavior During Therapy WFL for tasks assessed/performed                 Past Medical History:  Diagnosis Date   Abnormal pap 02/2003   CIN 2-3   Basal cell carcinoma of back 07/22/2021   GERD (gastroesophageal reflux disease)    Hyperlipidemia    Migraine with aura    Past Surgical History:  Procedure Laterality Date   BREAST BIOPSY Left 08/04/2022   [place] LT BREAST BX W LOC DEV 1ST LESION IMG BX SPEC US GUIDE 08/04/2022 [facility]   CERVICAL BIOPSY  W/ LOOP ELECTRODE EXCISION  02/2003   CIN 2-3   CHOLECYSTECTOMY  2015   COSMETIC SURGERY  2009   TEE WITHOUT CARDIOVERSION N/A 06/18/2023   Procedure: TRANSESOPHAGEAL ECHOCARDIOGRAM;  Surgeon: Wendall Stade, MD;  Location: [facility];  Service: Cardiovascular;  Laterality: N/A;   Patient Active Problem List   Diagnosis Date Noted   Malnutrition of moderate degree 06/19/2023   Staphylococcus aureus infection 06/19/2023   Bacteremia with signs of infection 06/18/2023   Discitis 06/15/2023   Right lateral epicondylitis 04/12/2023   Plantar fasciitis of left foot 08/17/2022   Ascending aorta dilatation -- found on CT Sep 2023 --> Recheck CT in Sep 2024 05/31/2022   Chest wall pain 04/20/2022   Traumatic hematoma of lower leg, left, initial encounter 11/03/2021   Basal cell carcinoma of back 07/22/2021   Dermatofibroma  07/22/2021   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   History of malignant neoplasm of skin 07/22/2021   Lentigo 07/22/2021   Melanocytic nevi of trunk 07/22/2021   Neoplasm of uncertain behavior of skin 07/22/2021   Other skin changes due to chronic exposure to nonionizing radiation 07/22/2021   Skin tag 07/22/2021   Low back pain 08/03/2020   Nonallopathic lesion of sacral region 08/03/2020   Nonallopathic lesion of thoracic region 08/03/2020   Nonallopathic lesion of lumbar region 08/03/2020   Class 1 obesity due to excess calories without serious comorbidity with body mass index (BMI) of 33.0 to 33.9 in adult 11/17/2017   Hypercholesterolemia, not on statin 08/18/2017   H/O abnormal cervical Papanicolaou smear 01/06/2013    PCP: Jarold Motto, PA   REFERRING PROVIDER: Jarold Motto, PA   REFERRING DIAG: [phone] (ICD-10-CM) - Discitis of lumbosacral region   Rationale for Evaluation and Treatment: Rehabilitation  THERAPY DIAG:  Other low back pain  Difficulty in walking, not elsewhere classified  Muscle weakness (generalized)  ONSET DATE: 05/29/23 fall with subsequent osteomyelitis L5/S1  SUBJECTIVE:  SUBJECTIVE STATEMENT: 08/21/2023 Reports occasional sciatic pain on the left side. States its not terrible and she can deal with it. Reports she did 3.5 hours of walking and had to take breaks.   Eval: Patient presents with husband and is standing during evaluation secondary to high levels of pain and concern.  Patient unsure if she can even lay down on table for fear she would not be able to get back up due to pain  Patient for started having pain on 9 2 of this year and her left hip where she saw a doctor who gave her some medications.  She then went abroad to [place] where she fell during a hike  and went to a physician overseas.  He gave her an injection of medications.  When she came back she saw another doctor as she was having more pain and MRI demonstrated osteomyelitis at L5-S1. She was instructed to go to the ER and was subsequently admitted to hospital from 06/15/23 - 06/20/23 for discitis, bacteremia, staphylococcus aureus infection. Given IV antibiotics. Repeat blood cultures negative.  Patient was taking gabapentin, naproxen, ibuprofen and oxy but has run out of [place] and her GABA prescription is almost up currently she is in severe pain and concerned for her quality of life once her GABA prescription runs up.  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  She is currently having severe pain with all activities movements and can barely get comfortable side-lying is most comfortable but she tosses and turns she has not been on her stomach.  It feels like she gets stuck after sitting for a while and cannot get upright and everything just locks up on her.  She has never had pain like this before.  Patient also reports that her middle finger was also possibly injured on the right hand during a transfer at the hospital it is swollen and painful.  Her right knee is also bothering her and she has noticed numbness in her right great toe it used to be in her heel but now is primarily just in her great toe.  PERTINENT HISTORY:  Migraines, GERD  PAIN:  Are you having pain? Yes: NPRS scale: 0/10 Pain location: back and R calf  Pain description: aching and throbbing Aggravating factors: Movement sitting transitional movements Relieving factors: Medication  PRECAUTIONS: Fall  RED FLAGS: None   WEIGHT BEARING RESTRICTIONS: No  FALLS:  Has patient fallen in last 6 months? Yes. 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  DIAGNOSTIC FINDINGS:  Lumbar MRI 06/15/2023    IMPRESSION: 1. Positive for Discitis Osteomyelitis at L5-S1. Pronounced epidural inflammation there, and small sacral epidural abscess tracking within the right posterior and lateral sacral epidural space from S1 through at least S3, involving the exiting right sacral nerve roots. Subsequent mild to moderate L5-S1 spinal stenosis, in part due to dural thickening and enhancement. And bilateral L5 neural foraminal stenosis and foraminal inflammation.   2. No paraspinal muscle abscess.  No other lumbar levels involved.  PATIENT SURVEYS:  FOTO 9%  SCREENING FOR RED FLAGS: Bowel or bladder incontinence: No Spinal tumors: No Cauda equina syndrome: No Compression fracture: No Abdominal aneurysm: No  COGNITION: Overall cognitive status: Within functional limits for tasks assessed     SENSATION: Not tested    POSTURE: Slumped to forward posture with heavy use hands on rolling walker  PALPATION: No tenderness to palpation noted in lumbar musculature or glutes  LUMBAR ROM: Unable to formally test secondary to high levels of pain  AROM   Flexion   Extension   Right lateral flexion   Left lateral flexion   Right rotation   Left rotation    (Blank rows = not tested)    LE Measurements unable to formally test secondary to high levels of pain Lower Extremity Right  Left    A/PROM MMT A/PROM MMT  Hip Flexion      Hip Extension      Hip Abduction      Hip Adduction      Hip Internal rotation      Hip External rotation      Knee Flexion      Knee Extension      Ankle Dorsiflexion      Ankle Plantarflexion      Ankle Inversion       Ankle Eversion       (Blank rows = not tested) * pain'   LUMBAR SPECIAL TESTS:  Unable to test secondary to high levels of pain FUNCTIONAL TESTS:  Sit to stand: Slow labored movements patient holding breath with increased pain strong use of arms. Unable to perform any bed mobilities today secondary to pain, fear of pain and fear of not being able to get up Stand to sit 3 attempts with elevated height of chair and strong use of her arms very painful slow-labored movements  GAIT: Distance walked: 25 feet in clinic Assistive device utilized: Walker - 2 wheeled Level of assistance: SBA Comments: Labored gait with a rolling walker, excessive weight on upper extremities, wide base of support, antalgic  TODAY'S TREATMENT:                                                                                                                              DATE:   08/21/2023  Therapeutic Exercise: Reviewed HEP    Seated: STS with pauses for 5" holds 2x5 reps, lumbar flexion stretch x5 15" holds kneeling: lunge hold with glute squeezes, tall kneeling holds with hip hinge - 16 minutes total     Standing:  Neuromuscular Re-education: tandem x1 30" holds B on floor--> on foam x3 30" holds , narrow BOS EX 3 10" holds on foam, SLS on floor x2 30" holds Manual Therapy:   Therapeutic Activity:   Self Care:   Trigger Point Dry Needling:  Modalities:     PATIENT EDUCATION:  Education details: on HEP, on rationale behind interventions, importance of stepping strategy for balance  recovery Person educated: Patient Education method: Explanation, Demonstration, and Handouts Education comprehension: verbalized understanding   HOME EXERCISE PROGRAM: [id number]  ASSESSMENT:  CLINICAL IMPRESSION: 08/21/2023 Added balance exercises which were tolerated well on compliant surface and significantly more challenging on non compliant surface. Added these exercises to HEP. Added kneeling positions and holds,  fatiguing for patient but no pain. Overall patient is doing well and would continue to benefit from skilled PT at this time.    Eval: Patient presents to physical therapy with complaints of severe low back pain after a fall resulting in osteomyelitis of L5-S1.  Patient has been treated with a course of antibiotics in the hospital and blood work has come back negative.  She presents with use of rolling walker and severe pain with all movements and transitional movements.  Session limited on objective information today secondary to high levels of pain.  Primary goal of today's session was to reassure patient and her family and give pain management strategies to help cope with the current pain levels.  Patient is greatly limited in function due to pain in range of motion and strength deficits and would greatly benefit from skilled PT at this time.  OBJECTIVE IMPAIRMENTS: Abnormal gait, decreased activity tolerance, decreased balance, decreased coordination, decreased endurance, decreased knowledge of use of DME, decreased mobility, difficulty walking, decreased ROM, decreased strength, improper body mechanics, postural dysfunction, and pain.   ACTIVITY LIMITATIONS: carrying, lifting, bending, sitting, standing, squatting, sleeping, stairs, transfers, bed mobility, bathing, toileting, dressing, hygiene/grooming, and locomotion level  PARTICIPATION LIMITATIONS: meal prep, cleaning, laundry, driving, shopping, community activity, and occupation  PERSONAL FACTORS: Age, Fitness, and 1 comorbidity: Osteomyelitis L5-S1  are also affecting patient's functional outcome.   REHAB POTENTIAL: Good  CLINICAL DECISION MAKING: Evolving/moderate complexity  EVALUATION COMPLEXITY: Moderate   GOALS: Goals reviewed with patient? yes  SHORT TERM GOALS: Target date: 08/10/2023 Patient will be independent in self management strategies to improve quality of life and functional outcomes. Baseline: New Program Goal  status: PROGRESSING  2.  Patient will report at least 25 % improvement in overall symptoms and/or function to demonstrate improved functional mobility Baseline: 0% better Goal status: MET  3.  Patient will be able to transition from sit to stand and stand to sit from a normal height chair without the use of her arms Baseline: Unable and very painful Goal status: MET  4.  Patient will be able to walk for 15 minutes with use of assistive device as needed without severe pain to improve ambulatory mobility in the home Baseline: Requires walker 5 to 10 minutes max with severe pain afterwards Goal status: MET    LONG TERM GOALS: Target date: 09/20/2023 Patient will report at least 50 % improvement in overall symptoms and/or function to demonstrate improved functional mobility Baseline: 0% better Goal status: MET  2.  Patient will improve score on FOTO outcomes measure to projected score to demonstrate overall improved function and QOL Baseline: see above Goal status: MET  3.  Patient will be able to walk up to 30 minutes without the use of a rolling walker and without severe pain to improve overall function and return to prior level of function Baseline: 5 to 10 minutes with rolling walker painful Goal status: MET  4.  Patient will be able to sit for at least 8 hours with intermittent standing without severe pain at the end of the dayto be able to perform job tasks Baseline: Currently 5 hours but severe pain afterwards Goal status:  PROGRESSING - current 1 hour   PLAN:  PT FREQUENCY: 2x/week  PT DURATION: 12 weeks  PLANNED INTERVENTIONS: Therapeutic exercises, Therapeutic activity, Neuromuscular re-education, Balance training, Gait training, Patient/Family education, Self Care, Joint mobilization, Joint manipulation, Stair training, Vestibular training, Canalith repositioning, Orthotic/Fit training, Prosthetic training, DME instructions, Aquatic Therapy, Dry Needling, Electrical  stimulation, Spinal manipulation, Spinal mobilization, Cryotherapy, Moist heat, Taping, Traction, Ultrasound, Ionotophoresis 4mg /ml Dexamethasone, Manual therapy, and Re-evaluation.   PLAN FOR NEXT SESSION: no bending/lifting/twisting,standing and progress exercises as tolerated   3:56 PM, 08/21/23 Tereasa Coop, DPT Physical Therapy with [facility]

## 2023-08-23 ENCOUNTER — Encounter: Payer: Self-pay | Admitting: Physical Therapy

## 2023-08-23 ENCOUNTER — Ambulatory Visit: Payer: BC Managed Care – PPO | Admitting: Physical Therapy

## 2023-08-23 DIAGNOSIS — M5459 Other low back pain: Secondary | ICD-10-CM

## 2023-08-23 DIAGNOSIS — R262 Difficulty in walking, not elsewhere classified: Secondary | ICD-10-CM | POA: Diagnosis not present

## 2023-08-23 DIAGNOSIS — M6281 Muscle weakness (generalized): Secondary | ICD-10-CM

## 2023-08-23 NOTE — Therapy (Signed)
OUTPATIENT PHYSICAL THERAPY THORACOLUMBAR TREATMENT       Patient Name: Susan [name] MRN: [id number] DOB:1976/03/01, 47 y.o., female Today's Date: 08/23/2023  END OF SESSION:  PT End of Session - 08/23/23 1511     Visit Number 16    Number of Visits 24    Date for PT Re-Evaluation 09/20/23    Authorization Type [employer] 40 VL    Authorization - Visit Number 16    Authorization - Number of Visits 40    Progress Note Due on Visit 21    PT Start Time 1512    PT Stop Time 1600    PT Time Calculation (min) 48 min    Activity Tolerance Patient tolerated treatment well    Behavior During Therapy WFL for tasks assessed/performed                 Past Medical History:  Diagnosis Date   Abnormal pap 02/2003   CIN 2-3   Basal cell carcinoma of back 07/22/2021   GERD (gastroesophageal reflux disease)    Hyperlipidemia    Migraine with aura    Past Surgical History:  Procedure Laterality Date   BREAST BIOPSY Left 08/04/2022   [place] LT BREAST BX W LOC DEV 1ST LESION IMG BX SPEC US GUIDE 08/04/2022 [facility]   CERVICAL BIOPSY  W/ LOOP ELECTRODE EXCISION  02/2003   CIN 2-3   CHOLECYSTECTOMY  2015   COSMETIC SURGERY  2009   TEE WITHOUT CARDIOVERSION N/A 06/18/2023   Procedure: TRANSESOPHAGEAL ECHOCARDIOGRAM;  Surgeon: Wendall Stade, MD;  Location: [facility];  Service: Cardiovascular;  Laterality: N/A;   Patient Active Problem List   Diagnosis Date Noted   Malnutrition of moderate degree 06/19/2023   Staphylococcus aureus infection 06/19/2023   Bacteremia with signs of infection 06/18/2023   Discitis 06/15/2023   Right lateral epicondylitis 04/12/2023   Plantar fasciitis of left foot 08/17/2022   Ascending aorta dilatation -- found on CT Sep 2023 --> Recheck CT in Sep 2024 05/31/2022   Chest wall pain 04/20/2022   Traumatic hematoma of lower leg, left, initial encounter 11/03/2021   Basal cell carcinoma of back 07/22/2021   Dermatofibroma  07/22/2021   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   History of malignant neoplasm of skin 07/22/2021   Lentigo 07/22/2021   Melanocytic nevi of trunk 07/22/2021   Neoplasm of uncertain behavior of skin 07/22/2021   Other skin changes due to chronic exposure to nonionizing radiation 07/22/2021   Skin tag 07/22/2021   Low back pain 08/03/2020   Nonallopathic lesion of sacral region 08/03/2020   Nonallopathic lesion of thoracic region 08/03/2020   Nonallopathic lesion of lumbar region 08/03/2020   Class 1 obesity due to excess calories without serious comorbidity with body mass index (BMI) of 33.0 to 33.9 in adult 11/17/2017   Hypercholesterolemia, not on statin 08/18/2017   H/O abnormal cervical Papanicolaou smear 01/06/2013    PCP: Jarold Motto, PA   REFERRING PROVIDER: Jarold Motto, PA   REFERRING DIAG: [phone] (ICD-10-CM) - Discitis of lumbosacral region   Rationale for Evaluation and Treatment: Rehabilitation  THERAPY DIAG:  Other low back pain  Difficulty in walking, not elsewhere classified  Muscle weakness (generalized)  ONSET DATE: 05/29/23 fall with subsequent osteomyelitis L5/S1  SUBJECTIVE:  SUBJECTIVE STATEMENT: 08/23/2023 Reports she was was a little sore yesterday. Reports she is like 80% better.   Eval: Patient presents with husband and is standing during evaluation secondary to high levels of pain and concern.  Patient unsure if she can even lay down on table for fear she would not be able to get back up due to pain  Patient for started having pain on 9 2 of this year and her left hip where she saw a doctor who gave her some medications.  She then went abroad to [place] where she fell during a hike and went to a physician overseas.  He gave her an injection of medications.  When she  came back she saw another doctor as she was having more pain and MRI demonstrated osteomyelitis at L5-S1. She was instructed to go to the ER and was subsequently admitted to hospital from 06/15/23 - 06/20/23 for discitis, bacteremia, staphylococcus aureus infection. Given IV antibiotics. Repeat blood cultures negative.  Patient was taking gabapentin, naproxen, ibuprofen and oxy but has run out of [place] and her GABA prescription is almost up currently she is in severe pain and concerned for her quality of life once her GABA prescription runs up.  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  She is currently having severe pain with all activities movements and can barely get comfortable side-lying is most comfortable but she tosses and turns she has not been on her stomach.  It feels like she gets stuck after sitting for a while and cannot get upright and everything just locks up on her.  She has never had pain like this before.  Patient also reports that her middle finger was also possibly injured on the right hand during a transfer at the hospital it is swollen and painful.  Her right knee is also bothering her and she has noticed numbness in her right great toe it used to be in her heel but now is primarily just in her great toe.  PERTINENT HISTORY:  Migraines, GERD  PAIN:  Are you having pain? Yes: NPRS scale: 0/10 Pain location: back and R calf  Pain description: aching and throbbing Aggravating factors: Movement sitting transitional movements Relieving factors: Medication  PRECAUTIONS: Fall  RED FLAGS: None   WEIGHT BEARING RESTRICTIONS: No  FALLS:  Has patient fallen in last 6 months? Yes. 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  DIAGNOSTIC FINDINGS:  Lumbar MRI 06/15/2023    IMPRESSION: 1. Positive for Discitis Osteomyelitis at L5-S1. Pronounced epidural inflammation there, and small sacral epidural abscess tracking within the right posterior and lateral sacral epidural space from S1 through at least S3, involving the exiting right sacral nerve roots. Subsequent mild to moderate L5-S1 spinal stenosis, in part due to dural thickening and enhancement. And bilateral L5 neural foraminal stenosis and foraminal inflammation.   2. No paraspinal muscle abscess.  No other lumbar levels involved.    PATIENT SURVEYS:  FOTO 9% , 12/5 76%   SCREENING FOR RED FLAGS: Bowel or  bladder incontinence: No Spinal tumors: No Cauda equina syndrome: No Compression fracture: No Abdominal aneurysm: No  COGNITION: Overall cognitive status: Within functional limits for tasks assessed     SENSATION: Not tested    POSTURE: Slumped to forward posture with heavy use hands on rolling walker  PALPATION: No tenderness to palpation noted in lumbar musculature or glutes  LUMBAR ROM: Unable to formally test secondary to high levels of pain  AROM 12/5  Flexion 25% limited  Extension 75% limited*  Right lateral flexion 50% limited  Left lateral flexion 50% limited*  Right rotation   Left rotation    (Blank rows = not tested)     Lower Extremity Right 12/5 Left 12/5   A/PROM MMT A/PROM MMT  Hip Flexion WFL 4 WFL 4+  Hip Extension 10 3+* 10 3+*  Hip Abduction      Hip Adduction      Hip Internal rotation 30  45   Hip External rotation 60  60   Knee Flexion WFL 4 WFL 4  Knee Extension WFL 4+ WFL 4+  Ankle Dorsiflexion  4+  4+  Ankle Plantarflexion      Ankle Inversion      Ankle Eversion       (Blank rows =  not tested) * pain/uncomfortable   LUMBAR SPECIAL TESTS:  Slump neg B Ely's Test neg B  Transitional movements: all bed mobilities and transitional movements pain free    TODAY'S TREATMENT:                                                                                                                              DATE:   08/23/2023   Therapeutic Exercise:  Objective measures updated, goals reviewed and updated: Supine:LTR 2 minutes, SKC x5 10" holds B, long body stretch with breathes x10 5" hlds Prone:  Seated: lumbar flexion x15 5" holds  Standing: Neuromuscular Re-education: Manual Therapy: Therapeutic Activity: Self Care: Trigger Point Dry Needling:  Modalities:     PATIENT EDUCATION:  Education details: on HEP, on morning mobility program, goals, progress and current presentation, FOTO score Person educated: Patient Education method: Explanation, Demonstration, and Handouts Education comprehension: verbalized understanding   HOME EXERCISE PROGRAM: [id number]  ASSESSMENT:  CLINICAL IMPRESSION: 08/23/2023 Updated objective measures on this date. Overall patient doing well. Discussed continued tightness and physical limitations. Updated goals but continuing with current POC. Answered all questions and importance of morning mobility routine. Educated patient in safe transition to gym/workout routing. Will continue with current POC as tolerated.    Eval: Patient presents to physical therapy with complaints of severe low back pain after a fall resulting in osteomyelitis of L5-S1.  Patient has been treated with a course of antibiotics in the hospital and blood work has come back negative.  She presents with use of rolling walker and severe pain with all movements and transitional movements.  Session limited on objective information today secondary to high levels  of pain.  Primary goal of today's session was to reassure patient and her family and give pain management  strategies to help cope with the current pain levels.  Patient is greatly limited in function due to pain in range of motion and strength deficits and would greatly benefit from skilled PT at this time.  OBJECTIVE IMPAIRMENTS: Abnormal gait, decreased activity tolerance, decreased balance, decreased coordination, decreased endurance, decreased knowledge of use of DME, decreased mobility, difficulty walking, decreased ROM, decreased strength, improper body mechanics, postural dysfunction, and pain.   ACTIVITY LIMITATIONS: carrying, lifting, bending, sitting, standing, squatting, sleeping, stairs, transfers, bed mobility, bathing, toileting, dressing, hygiene/grooming, and locomotion level  PARTICIPATION LIMITATIONS: meal prep, cleaning, laundry, driving, shopping, community activity, and occupation  PERSONAL FACTORS: Age, Fitness, and 1 comorbidity: Osteomyelitis L5-S1  are also affecting patient's functional outcome.   REHAB POTENTIAL: Good  CLINICAL DECISION MAKING: Evolving/moderate complexity  EVALUATION COMPLEXITY: Moderate   GOALS: Goals reviewed with patient? yes  SHORT TERM GOALS: Target date: 08/10/2023 Patient will be independent in self management strategies to improve quality of life and functional outcomes. Baseline: New Program Goal status: PROGRESSING  2.  Patient will report at least 25 % improvement in overall symptoms and/or function to demonstrate improved functional mobility Baseline: 0% better Goal status: MET  3.  Patient will be able to transition from sit to stand and stand to sit from a normal height chair without the use of her arms Baseline: Unable and very painful Goal status: MET  4.  Patient will be able to walk for 15 minutes with use of assistive device as needed without severe pain to improve ambulatory mobility in the home Baseline: Requires walker 5 to 10 minutes max with severe pain afterwards Goal status: MET    LONG TERM GOALS: Target date:  09/20/2023 Patient will report at least 50 % improvement in overall symptoms and/or function to demonstrate improved functional mobility Baseline: 0% better Goal status: MET  2.  Patient will improve score on FOTO outcomes measure to projected score to demonstrate overall improved function and QOL Baseline: see above Goal status: MET  3.  Patient will be able to walk up to 30 minutes without the use of a rolling walker and without severe pain to improve overall function and return to prior level of function Baseline: 5 to 10 minutes with rolling walker painful Goal status: MET  4.  Patient will be able to sit for at least 8 hours with intermittent standing without severe pain at the end of the dayto be able to perform job tasks Baseline: Currently 5 hours but severe pain afterwards Goal status: MET  5.  Patient will report performing daily mobility routine in the morning to aid in ability to get off toilet. Baseline: difficult - uses UE to assist Goal status: NEW  6.  Patient will demonstrate pain-free lumbar ROM in all directions Baseline: painful Goal status: NEW  7.  Patient will be able to perform squat hold for 30 seconds to improve ability to clean tires on car. Baseline: unable Goal status: NEW  PLAN:  PT FREQUENCY: 2x/week  PT DURATION: 12 weeks  PLANNED INTERVENTIONS: Therapeutic exercises, Therapeutic activity, Neuromuscular re-education, Balance training, Gait training, Patient/Family education, Self Care, Joint mobilization, Joint manipulation, Stair training, Vestibular training, Canalith repositioning, Orthotic/Fit training, Prosthetic training, DME instructions, Aquatic Therapy, Dry Needling, Electrical stimulation, Spinal manipulation, Spinal mobilization, Cryotherapy, Moist heat, Taping, Traction, Ultrasound, Ionotophoresis 4mg /ml Dexamethasone, Manual therapy, and Re-evaluation.   PLAN FOR NEXT  SESSION: work towards updated goals  4:09 PM, 08/23/23 Tereasa Coop, DPT Physical Therapy with [facility]

## 2023-08-25 ENCOUNTER — Other Ambulatory Visit: Payer: Self-pay | Admitting: Physician Assistant

## 2023-08-27 ENCOUNTER — Encounter: Payer: Self-pay | Admitting: Physical Therapy

## 2023-08-27 ENCOUNTER — Ambulatory Visit (INDEPENDENT_AMBULATORY_CARE_PROVIDER_SITE_OTHER): Payer: BC Managed Care – PPO | Admitting: Physical Therapy

## 2023-08-27 DIAGNOSIS — M6281 Muscle weakness (generalized): Secondary | ICD-10-CM | POA: Diagnosis not present

## 2023-08-27 DIAGNOSIS — R262 Difficulty in walking, not elsewhere classified: Secondary | ICD-10-CM

## 2023-08-27 DIAGNOSIS — M5459 Other low back pain: Secondary | ICD-10-CM

## 2023-08-27 NOTE — Therapy (Signed)
OUTPATIENT PHYSICAL THERAPY THORACOLUMBAR TREATMENT       Patient Name: Susan [name] MRN: [id number] DOB:1976/08/23, 47 y.o., female Today's Date: 08/27/2023  END OF SESSION:  PT End of Session - 08/27/23 1518     Visit Number 17    Number of Visits 24    Date for PT Re-Evaluation 09/20/23    Authorization Type [employer] 40 VL    Authorization - Visit Number 17    Authorization - Number of Visits 40    Progress Note Due on Visit 21    PT Start Time 1518    PT Stop Time 1556    PT Time Calculation (min) 38 min    Activity Tolerance Patient tolerated treatment well    Behavior During Therapy WFL for tasks assessed/performed                 Past Medical History:  Diagnosis Date   Abnormal pap 02/2003   CIN 2-3   Basal cell carcinoma of back 07/22/2021   GERD (gastroesophageal reflux disease)    Hyperlipidemia    Migraine with aura    Past Surgical History:  Procedure Laterality Date   BREAST BIOPSY Left 08/04/2022   [place] LT BREAST BX W LOC DEV 1ST LESION IMG BX SPEC US GUIDE 08/04/2022 [facility]   CERVICAL BIOPSY  W/ LOOP ELECTRODE EXCISION  02/2003   CIN 2-3   CHOLECYSTECTOMY  2015   COSMETIC SURGERY  2009   TEE WITHOUT CARDIOVERSION N/A 06/18/2023   Procedure: TRANSESOPHAGEAL ECHOCARDIOGRAM;  Surgeon: Wendall Stade, MD;  Location: [facility];  Service: Cardiovascular;  Laterality: N/A;   Patient Active Problem List   Diagnosis Date Noted   Malnutrition of moderate degree 06/19/2023   Staphylococcus aureus infection 06/19/2023   Bacteremia with signs of infection 06/18/2023   Discitis 06/15/2023   Right lateral epicondylitis 04/12/2023   Plantar fasciitis of left foot 08/17/2022   Ascending aorta dilatation -- found on CT Sep 2023 --> Recheck CT in Sep 2024 05/31/2022   Chest wall pain 04/20/2022   Traumatic hematoma of lower leg, left, initial encounter 11/03/2021   Basal cell carcinoma of back 07/22/2021   Dermatofibroma  07/22/2021   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   History of malignant neoplasm of skin 07/22/2021   Lentigo 07/22/2021   Melanocytic nevi of trunk 07/22/2021   Neoplasm of uncertain behavior of skin 07/22/2021   Other skin changes due to chronic exposure to nonionizing radiation 07/22/2021   Skin tag 07/22/2021   Low back pain 08/03/2020   Nonallopathic lesion of sacral region 08/03/2020   Nonallopathic lesion of thoracic region 08/03/2020   Nonallopathic lesion of lumbar region 08/03/2020   Class 1 obesity due to excess calories without serious comorbidity with body mass index (BMI) of 33.0 to 33.9 in adult 11/17/2017   Hypercholesterolemia, not on statin 08/18/2017   H/O abnormal cervical Papanicolaou smear 01/06/2013    PCP: Jarold Motto, PA   REFERRING PROVIDER: Jarold Motto, PA   REFERRING DIAG: [phone] (ICD-10-CM) - Discitis of lumbosacral region   Rationale for Evaluation and Treatment: Rehabilitation  THERAPY DIAG:  Other low back pain  Difficulty in walking, not elsewhere classified  Muscle weakness (generalized)  ONSET DATE: 05/29/23 fall with subsequent osteomyelitis L5/S1  SUBJECTIVE:  SUBJECTIVE STATEMENT: 08/27/2023 Reports she was was a little sore yesterday. Reports she is like 80% better. States the morning exercises have helped.  Eval: Patient presents with husband and is standing during evaluation secondary to high levels of pain and concern.  Patient unsure if she can even lay down on table for fear she would not be able to get back up due to pain  Patient for started having pain on 9 2 of this year and her left hip where she saw a doctor who gave her some medications.  She then went abroad to [place] where she fell during a hike and went to a physician overseas.  He gave  her an injection of medications.  When she came back she saw another doctor as she was having more pain and MRI demonstrated osteomyelitis at L5-S1. She was instructed to go to the ER and was subsequently admitted to hospital from 06/15/23 - 06/20/23 for discitis, bacteremia, staphylococcus aureus infection. Given IV antibiotics. Repeat blood cultures negative.  Patient was taking gabapentin, naproxen, ibuprofen and oxy but has run out of [place] and her GABA prescription is almost up currently she is in severe pain and concerned for her quality of life once her GABA prescription runs up.  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  She is currently having severe pain with all activities movements and can barely get comfortable side-lying is most comfortable but she tosses and turns she has not been on her stomach.  It feels like she gets stuck after sitting for a while and cannot get upright and everything just locks up on her.  She has never had pain like this before.  Patient also reports that her middle finger was also possibly injured on the right hand during a transfer at the hospital it is swollen and painful.  Her right knee is also bothering her and she has noticed numbness in her right great toe it used to be in her heel but now is primarily just in her great toe.  PERTINENT HISTORY:  Migraines, GERD  PAIN:  Are you having pain? Yes: NPRS scale: 1/10 Pain location: back and R calf  Pain description: aching and throbbing Aggravating factors: Movement sitting transitional movements Relieving factors: Medication  PRECAUTIONS: Fall  RED FLAGS: None   WEIGHT BEARING RESTRICTIONS: No  FALLS:  Has patient fallen in last 6 months? Yes. 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  DIAGNOSTIC FINDINGS:  Lumbar MRI 06/15/2023    IMPRESSION: 1. Positive for Discitis Osteomyelitis at L5-S1. Pronounced epidural inflammation there, and small sacral epidural abscess tracking within the right posterior and lateral sacral epidural space from S1 through at least S3, involving the exiting right sacral nerve roots. Subsequent mild to moderate L5-S1 spinal stenosis, in part due to dural thickening and enhancement. And bilateral L5 neural foraminal stenosis and foraminal inflammation.   2. No paraspinal muscle abscess.  No other lumbar levels involved.    PATIENT SURVEYS:  FOTO 9% , 12/5 76%   SCREENING  FOR RED FLAGS: Bowel or bladder incontinence: No Spinal tumors: No Cauda equina syndrome: No Compression fracture: No Abdominal aneurysm: No  COGNITION: Overall cognitive status: Within functional limits for tasks assessed     SENSATION: Not tested    POSTURE: Slumped to forward posture with heavy use hands on rolling walker  PALPATION: No tenderness to palpation noted in lumbar musculature or glutes  LUMBAR ROM: Unable to formally test secondary to high levels of pain  AROM 12/5  Flexion 25% limited  Extension 75% limited*  Right lateral flexion 50% limited  Left lateral flexion 50% limited*  Right rotation   Left rotation    (Blank rows = not tested)     Lower Extremity Right 12/5 Left 12/5   A/PROM MMT A/PROM MMT  Hip Flexion WFL 4 WFL 4+  Hip Extension 10 3+* 10 3+*  Hip Abduction      Hip Adduction      Hip Internal rotation 30  45   Hip External rotation 60  60   Knee Flexion WFL 4 WFL 4  Knee Extension WFL 4+ WFL 4+  Ankle Dorsiflexion  4+  4+  Ankle Plantarflexion      Ankle Inversion      Ankle  Eversion       (Blank rows = not tested) * pain/uncomfortable   LUMBAR SPECIAL TESTS:  Slump neg B Ely's Test neg B  Transitional movements: all bed mobilities and transitional movements pain free    TODAY'S TREATMENT:                                                                                                                              DATE:   08/27/2023   Therapeutic Exercise: Supine:90/90 supportive -5 minutes, DKC 2 minutes, LTR 2 minutes, SAQS - ALTERNATING   2 minutes on ball, bike L1-2 5 minutes LTR 2 minutes, SKC x5 10" holds B, long body stretch with breathes x10 5" hlds Prone:  Seated:    Standing: wall squats with ball support x12 - slow and controlled, suitcase carry x2 laps B with 6# then with 10# x2 laps B, hip flexor stretch 5" holds 2 minutes each leg Neuromuscular Re-education: Manual Therapy: Therapeutic Activity: Self Care: Trigger Point Dry Needling:  Modalities:     PATIENT EDUCATION:  Education details: on HEP, on benefits of 90/90 position Person educated: Patient Education method: Explanation, Demonstration, and Handouts Education comprehension: verbalized understanding   HOME EXERCISE PROGRAM: [id number]  ASSESSMENT:  CLINICAL IMPRESSION: 08/27/2023 Started with 90/90 supportive position secondary to pain in back. This abolished back symptoms. Able to add bike with slight resistance even though this was challenging. Added weighted suitcase care for light loading of the spine and legs. Tolerated this well with no pain noted. Overall patient doing well. Added decompressive exercises (90/90 position) to HEP. Will continue with current POC as tolerated.    Eval: Patient presents to physical therapy with complaints of severe  low back pain after a fall resulting in osteomyelitis of L5-S1.  Patient has been treated with a course of antibiotics in the hospital and blood work has come back negative.  She presents with use of rolling walker and severe  pain with all movements and transitional movements.  Session limited on objective information today secondary to high levels of pain.  Primary goal of today's session was to reassure patient and her family and give pain management strategies to help cope with the current pain levels.  Patient is greatly limited in function due to pain in range of motion and strength deficits and would greatly benefit from skilled PT at this time.  OBJECTIVE IMPAIRMENTS: Abnormal gait, decreased activity tolerance, decreased balance, decreased coordination, decreased endurance, decreased knowledge of use of DME, decreased mobility, difficulty walking, decreased ROM, decreased strength, improper body mechanics, postural dysfunction, and pain.   ACTIVITY LIMITATIONS: carrying, lifting, bending, sitting, standing, squatting, sleeping, stairs, transfers, bed mobility, bathing, toileting, dressing, hygiene/grooming, and locomotion level  PARTICIPATION LIMITATIONS: meal prep, cleaning, laundry, driving, shopping, community activity, and occupation  PERSONAL FACTORS: Age, Fitness, and 1 comorbidity: Osteomyelitis L5-S1  are also affecting patient's functional outcome.   REHAB POTENTIAL: Good  CLINICAL DECISION MAKING: Evolving/moderate complexity  EVALUATION COMPLEXITY: Moderate   GOALS: Goals reviewed with patient? yes  SHORT TERM GOALS: Target date: 08/10/2023 Patient will be independent in self management strategies to improve quality of life and functional outcomes. Baseline: New Program Goal status: PROGRESSING  2.  Patient will report at least 25 % improvement in overall symptoms and/or function to demonstrate improved functional mobility Baseline: 0% better Goal status: MET  3.  Patient will be able to transition from sit to stand and stand to sit from a normal height chair without the use of her arms Baseline: Unable and very painful Goal status: MET  4.  Patient will be able to walk for 15 minutes  with use of assistive device as needed without severe pain to improve ambulatory mobility in the home Baseline: Requires walker 5 to 10 minutes max with severe pain afterwards Goal status: MET    LONG TERM GOALS: Target date: 09/20/2023 Patient will report at least 50 % improvement in overall symptoms and/or function to demonstrate improved functional mobility Baseline: 0% better Goal status: MET  2.  Patient will improve score on FOTO outcomes measure to projected score to demonstrate overall improved function and QOL Baseline: see above Goal status: MET  3.  Patient will be able to walk up to 30 minutes without the use of a rolling walker and without severe pain to improve overall function and return to prior level of function Baseline: 5 to 10 minutes with rolling walker painful Goal status: MET  4.  Patient will be able to sit for at least 8 hours with intermittent standing without severe pain at the end of the dayto be able to perform job tasks Baseline: Currently 5 hours but severe pain afterwards Goal status: MET  5.  Patient will report performing daily mobility routine in the morning to aid in ability to get off toilet. Baseline: difficult - uses UE to assist Goal status: NEW  6.  Patient will demonstrate pain-free lumbar ROM in all directions Baseline: painful Goal status: NEW  7.  Patient will be able to perform squat hold for 30 seconds to improve ability to clean tires on car. Baseline: unable Goal status: NEW  PLAN:  PT FREQUENCY: 2x/week  PT DURATION: 12 weeks  PLANNED  INTERVENTIONS: Therapeutic exercises, Therapeutic activity, Neuromuscular re-education, Balance training, Gait training, Patient/Family education, Self Care, Joint mobilization, Joint manipulation, Stair training, Vestibular training, Canalith repositioning, Orthotic/Fit training, Prosthetic training, DME instructions, Aquatic Therapy, Dry Needling, Electrical stimulation, Spinal manipulation,  Spinal mobilization, Cryotherapy, Moist heat, Taping, Traction, Ultrasound, Ionotophoresis 4mg /ml Dexamethasone, Manual therapy, and Re-evaluation.   PLAN FOR NEXT SESSION: work towards updated goals  3:56 PM, 08/27/23 Tereasa Coop, DPT Physical Therapy with [facility]

## 2023-08-29 ENCOUNTER — Encounter: Payer: BC Managed Care – PPO | Admitting: Physical Therapy

## 2023-08-30 ENCOUNTER — Encounter: Payer: BC Managed Care – PPO | Admitting: Physical Therapy

## 2023-08-30 ENCOUNTER — Ambulatory Visit (HOSPITAL_COMMUNITY)
Admission: RE | Admit: 2023-08-30 | Discharge: 2023-08-30 | Disposition: A | Discharge status: Home / Self Care | Payer: BC Managed Care – PPO | Source: Ambulatory Visit | Attending: Internal Medicine | Admitting: Internal Medicine

## 2023-08-30 DIAGNOSIS — M4626 Osteomyelitis of vertebra, lumbar region: Secondary | ICD-10-CM | POA: Diagnosis not present

## 2023-08-30 DIAGNOSIS — M869 Osteomyelitis, unspecified: Secondary | ICD-10-CM | POA: Diagnosis not present

## 2023-08-30 DIAGNOSIS — M4647 Discitis, unspecified, lumbosacral region: Secondary | ICD-10-CM | POA: Diagnosis not present

## 2023-08-30 DIAGNOSIS — G062 Extradural and subdural abscess, unspecified: Secondary | ICD-10-CM | POA: Diagnosis not present

## 2023-08-30 DIAGNOSIS — M4808 Spinal stenosis, sacral and sacrococcygeal region: Secondary | ICD-10-CM | POA: Diagnosis not present

## 2023-08-30 DIAGNOSIS — M48061 Spinal stenosis, lumbar region without neurogenic claudication: Secondary | ICD-10-CM | POA: Diagnosis not present

## 2023-08-30 MED ORDER — GADOBUTROL 1 MMOL/ML IV SOLN
7.0000 mL | Freq: Once | INTRAVENOUS | Status: AC | PRN
  Administered 2023-08-30: 7 mL via INTRAVENOUS

## 2023-09-04 ENCOUNTER — Encounter: Payer: BC Managed Care – PPO | Admitting: Physical Therapy

## 2023-09-10 ENCOUNTER — Encounter: Payer: Self-pay | Admitting: Physical Therapy

## 2023-09-10 ENCOUNTER — Ambulatory Visit (INDEPENDENT_AMBULATORY_CARE_PROVIDER_SITE_OTHER): Payer: BC Managed Care – PPO | Admitting: Physical Therapy

## 2023-09-10 DIAGNOSIS — M6281 Muscle weakness (generalized): Secondary | ICD-10-CM | POA: Diagnosis not present

## 2023-09-10 DIAGNOSIS — R262 Difficulty in walking, not elsewhere classified: Secondary | ICD-10-CM | POA: Diagnosis not present

## 2023-09-10 DIAGNOSIS — M5459 Other low back pain: Secondary | ICD-10-CM

## 2023-09-10 NOTE — Therapy (Signed)
OUTPATIENT PHYSICAL THERAPY THORACOLUMBAR TREATMENT   PHYSICAL THERAPY DISCHARGE SUMMARY  Visits from Start of Care: 18  Current functional level related to goals / functional outcomes: All goals met   Remaining deficits: See below   Education / Equipment: See below   Patient agrees to discharge. Patient goals were met. Patient is being discharged due to being pleased with the current functional level.     Patient Name: Susan [name] MRN: [id number] DOB:05/20/1976, 47 y.o., female Today's Date: 09/10/2023  END OF SESSION:  PT End of Session - 09/10/23 1102     Visit Number 18    Number of Visits 24    Date for PT Re-Evaluation 09/20/23    Authorization Type [employer] [id number] VL    Authorization - Visit Number 18    Authorization - Number of Visits 40    Progress Note Due on Visit 21    PT Start Time 1104    PT Stop Time 1142    PT Time Calculation (min) 38 min    Activity Tolerance Patient tolerated treatment well    Behavior During Therapy WFL for tasks assessed/performed                 Past Medical History:  Diagnosis Date   Abnormal pap 02/2003   CIN 2-3   Basal cell carcinoma of back 07/22/2021   GERD (gastroesophageal reflux disease)    Hyperlipidemia    Migraine with aura    Past Surgical History:  Procedure Laterality Date   BREAST BIOPSY Left 08/04/2022   [place] LT BREAST BX W LOC DEV 1ST LESION IMG BX SPEC US GUIDE 08/04/2022 [facility]   CERVICAL BIOPSY  W/ LOOP ELECTRODE EXCISION  02/2003   CIN 2-3   CHOLECYSTECTOMY  2015   COSMETIC SURGERY  2009   TEE WITHOUT CARDIOVERSION N/A 06/18/2023   Procedure: TRANSESOPHAGEAL ECHOCARDIOGRAM;  Surgeon: Wendall Stade, MD;  Location: [facility];  Service: Cardiovascular;  Laterality: N/A;   Patient Active Problem List   Diagnosis Date Noted   Malnutrition of moderate degree 06/19/2023   Staphylococcus aureus infection 06/19/2023   Bacteremia with signs of infection 06/18/2023    Discitis 06/15/2023   Right lateral epicondylitis 04/12/2023   Plantar fasciitis of left foot 08/17/2022   Ascending aorta dilatation -- found on CT Sep 2023 --> Recheck CT in Sep 2024 05/31/2022   Chest wall pain 04/20/2022   Traumatic hematoma of lower leg, left, initial encounter 11/03/2021   Basal cell carcinoma of back 07/22/2021   Dermatofibroma 07/22/2021   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   History of malignant neoplasm of skin 07/22/2021   Lentigo 07/22/2021   Melanocytic nevi of trunk 07/22/2021   Neoplasm of uncertain behavior of skin 07/22/2021   Other skin changes due to chronic exposure to nonionizing radiation 07/22/2021   Skin tag 07/22/2021   Low back pain 08/03/2020   Nonallopathic lesion of sacral region 08/03/2020   Nonallopathic lesion of thoracic region 08/03/2020   Nonallopathic lesion of lumbar region 08/03/2020   Class 1 obesity due to excess calories without serious comorbidity with body mass index (BMI) of 33.0 to 33.9 in adult 11/17/2017   Hypercholesterolemia, not on statin 08/18/2017   H/O abnormal cervical Papanicolaou smear 01/06/2013    PCP: Jarold Motto, PA   REFERRING PROVIDER: Jarold Motto, PA   REFERRING DIAG: [phone] (ICD-10-CM) - Discitis of lumbosacral region   Rationale for Evaluation and Treatment: Rehabilitation  THERAPY DIAG:  Other low back  pain  Difficulty in walking, not elsewhere classified  Muscle weakness (generalized)  ONSET DATE: 05/29/23 fall with subsequent osteomyelitis L5/S1  SUBJECTIVE:                                                                                                                                                                                           SUBJECTIVE STATEMENT: 09/10/2023 States she is doing better. She made her MRI but doesn't have the report. States she is bending over and getting off the toilet better. States the mornings continue to be worse and sitting for too longer. But getting  up and morning stretches help. States she took a 20 minute walk without pain but  back felt more fatigue.   Eval: Patient presents with husband and is standing during evaluation secondary to high levels of pain and concern.  Patient unsure if she can even lay down on table for fear she would not be able to get back up due to pain  Patient for started having pain on 9 2 of this year and her left hip where she saw a doctor who gave her some medications.  She then went abroad to [place] where she fell during a hike and went to a physician overseas.  He gave her an injection of medications.  When she came back she saw another doctor as she was having more pain and MRI demonstrated osteomyelitis at L5-S1. She was instructed to go to the ER and was subsequently admitted to hospital from 06/15/23 - 06/20/23 for discitis, bacteremia, staphylococcus aureus infection. Given IV antibiotics. Repeat blood cultures negative.  Patient was taking gabapentin, naproxen, ibuprofen and oxy but has run out of [place] and her GABA prescription is almost up currently she is in severe pain and concerned for her quality of life once her GABA prescription runs up.  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  She is currently having severe pain with all activities movements and can barely get comfortable side-lying is most comfortable but she tosses and turns she has not been on her stomach.  It feels like she gets stuck after sitting for a while and cannot get upright and everything just locks up on her.  She has never had pain like this before.  Patient also reports that her middle finger was also possibly injured on the right hand during a transfer at the hospital it is swollen and painful.  Her right knee is also bothering her and she has noticed numbness in her right great toe it used to be in her heel but now is primarily just in her great  toe.  PERTINENT HISTORY:  Migraines, GERD  PAIN:  Are you having pain? Yes:  NPRS scale: 0/10 Pain location: back and R calf  Pain description: aching and throbbing Aggravating factors: Movement sitting transitional movements Relieving factors: Medication  PRECAUTIONS: Fall  RED FLAGS: None   WEIGHT BEARING RESTRICTIONS: No  FALLS:  Has patient fallen in last 6 months? Yes. 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  DIAGNOSTIC FINDINGS:  Lumbar MRI 06/15/2023    IMPRESSION: 1. Positive for Discitis Osteomyelitis at L5-S1. Pronounced epidural inflammation there, and small sacral epidural abscess tracking within the right posterior and lateral sacral epidural space from S1 through at least S3, involving the exiting right sacral nerve roots. Subsequent mild to moderate L5-S1 spinal stenosis, in part due to dural thickening and enhancement. And bilateral L5 neural foraminal stenosis and foraminal inflammation.   2. No paraspinal muscle abscess.  No other lumbar levels involved.    PATIENT SURVEYS:  FOTO 9% , 12/5 76%, 12/23 83%  SCREENING FOR RED FLAGS: Bowel or bladder incontinence: No Spinal tumors: No Cauda equina syndrome: No Compression fracture: No Abdominal aneurysm: No  COGNITION: Overall cognitive status: Within functional limits for tasks assessed     SENSATION: Not tested    POSTURE: Slumped to forward posture with heavy use hands on rolling walker  PALPATION: No tenderness to palpation noted in lumbar musculature or glutes  LUMBAR ROM: Unable to formally test secondary to high levels of  pain  AROM 12/23  Flexion 25% limited  Extension 75% limited  Right lateral flexion 50% limited  Left lateral flexion 50% limited  Right rotation   Left rotation    (Blank rows = not tested)     Lower Extremity Right 12/23 Left 12/23   A/PROM MMT A/PROM MMT  Hip Flexion WFL 4+ WFL 5  Hip Extension 10 3+ 10 3+  Hip Abduction      Hip Adduction      Hip Internal rotation 30  45   Hip External rotation 60  60   Knee Flexion WFL 4+ WFL 4+  Knee Extension WFL 5 WFL 5  Ankle Dorsiflexion  4+  4+  Ankle Plantarflexion      Ankle Inversion      Ankle Eversion       (Blank rows = not tested) * pain/uncomfortable   LUMBAR SPECIAL TESTS:  Slump neg B Ely's Test neg B  Transitional movements: all bed mobilities and transitional movements pain free    TODAY'S TREATMENT:  DATE:   09/10/2023   Therapeutic Exercise: Objective measures updated, Foto updated, and goals updated review of entire home exercise program, education on transition to gym and things to avoid and things to focus on. Supine: Bridges 2 x 15  Seated:    Standing:  Neuromuscular Re-education: Manual Therapy: Therapeutic Activity: Self Care: Trigger Point Dry Needling:  Modalities:     PATIENT EDUCATION:  Education details: on HEP, on progress made Person educated: Patient Education method: Explanation, Demonstration, and Handouts Education comprehension: verbalized understanding   HOME EXERCISE PROGRAM: [id number]  ASSESSMENT:  CLINICAL IMPRESSION: 09/10/2023 Patient doing very well and has met all new goals at this time and all previous goals as well.  Answered all questions and reviewed home exercise program.  Discussed transition to gym as well as things to avoid (heavy weights with spinal compression) and things to focus on to avoid injury.  Patient to discharge from  PT at this time secondary to progress made and strong questions prior to end of session.   Eval: Patient presents to physical therapy with complaints of severe low back pain after a fall resulting in osteomyelitis of L5-S1.  Patient has been treated with a course of antibiotics in the hospital and blood work has come back negative.  She presents with use of rolling walker and severe pain with all movements and transitional movements.  Session limited on objective information today secondary to high levels of pain.  Primary goal of today's session was to reassure patient and her family and give pain management strategies to help cope with the current pain levels.  Patient is greatly limited in function due to pain in range of motion and strength deficits and would greatly benefit from skilled PT at this time.  OBJECTIVE IMPAIRMENTS: Abnormal gait, decreased activity tolerance, decreased balance, decreased coordination, decreased endurance, decreased knowledge of use of DME, decreased mobility, difficulty walking, decreased ROM, decreased strength, improper body mechanics, postural dysfunction, and pain.   ACTIVITY LIMITATIONS: carrying, lifting, bending, sitting, standing, squatting, sleeping, stairs, transfers, bed mobility, bathing, toileting, dressing, hygiene/grooming, and locomotion level  PARTICIPATION LIMITATIONS: meal prep, cleaning, laundry, driving, shopping, community activity, and occupation  PERSONAL FACTORS: Age, Fitness, and 1 comorbidity: Osteomyelitis L5-S1  are also affecting patient's functional outcome.   REHAB POTENTIAL: Good  CLINICAL DECISION MAKING: Evolving/moderate complexity  EVALUATION COMPLEXITY: Moderate   GOALS: Goals reviewed with patient? yes  SHORT TERM GOALS: Target date: 08/10/2023 Patient will be independent in self management strategies to improve quality of life and functional outcomes. Baseline: New Program Goal status: MET  2.  Patient will report  at least 25 % improvement in overall symptoms and/or function to demonstrate improved functional mobility Baseline: 0% better Goal status: MET  3.  Patient will be able to transition from sit to stand and stand to sit from a normal height chair without the use of her arms Baseline: Unable and very painful Goal status: MET  4.  Patient will be able to walk for 15 minutes with use of assistive device as needed without severe pain to improve ambulatory mobility in the home Baseline: Requires walker 5 to 10 minutes max with severe pain afterwards Goal status: MET    LONG TERM GOALS: Target date: 09/20/2023 Patient will report at least 50 % improvement in overall symptoms and/or function to demonstrate improved functional mobility Baseline: 0% better Goal status: MET  2.  Patient will improve score on FOTO outcomes measure to projected score to demonstrate overall improved function  and QOL Baseline: see above Goal status: MET  3.  Patient will be able to walk up to 30 minutes without the use of a rolling walker and without severe pain to improve overall function and return to prior level of function Baseline: 5 to 10 minutes with rolling walker painful Goal status: MET  4.  Patient will be able to sit for at least 8 hours with intermittent standing without severe pain at the end of the dayto be able to perform job tasks Baseline: Currently 5 hours but severe pain afterwards Goal status: MET  5.  Patient will report performing daily mobility routine in the morning to aid in ability to get off toilet. Baseline: difficult - uses UE to assist Goal status: MET  6.  Patient will demonstrate pain-free lumbar ROM in all directions Baseline: painful Goal status: MET  7.  Patient will be able to perform squat hold for 30 seconds to improve ability to clean tires on car. Baseline: unable Goal status: MET  PLAN:  PT FREQUENCY: 2x/week  PT DURATION: 12 weeks  PLANNED INTERVENTIONS:  Therapeutic exercises, Therapeutic activity, Neuromuscular re-education, Balance training, Gait training, Patient/Family education, Self Care, Joint mobilization, Joint manipulation, Stair training, Vestibular training, Canalith repositioning, Orthotic/Fit training, Prosthetic training, DME instructions, Aquatic Therapy, Dry Needling, Electrical stimulation, Spinal manipulation, Spinal mobilization, Cryotherapy, Moist heat, Taping, Traction, Ultrasound, Ionotophoresis 4mg /ml Dexamethasone, Manual therapy, and Re-evaluation.   PLAN FOR NEXT SESSION: DC to HEP  11:50 AM, 09/10/23 Tereasa Coop, DPT Physical Therapy with [facility]

## 2023-09-20 ENCOUNTER — Ambulatory Visit: Payer: BC Managed Care – PPO | Admitting: Internal Medicine

## 2023-09-27 ENCOUNTER — Other Ambulatory Visit: Payer: Self-pay

## 2023-09-27 ENCOUNTER — Ambulatory Visit: Payer: BC Managed Care – PPO | Admitting: Internal Medicine

## 2023-09-27 ENCOUNTER — Encounter: Payer: Self-pay | Admitting: Internal Medicine

## 2023-09-27 VITALS — BP 129/85 | HR 58 | Temp 98.1°F | Ht 68.0 in | Wt 212.0 lb

## 2023-09-27 DIAGNOSIS — G062 Extradural and subdural abscess, unspecified: Secondary | ICD-10-CM | POA: Diagnosis not present

## 2023-09-27 DIAGNOSIS — B958 Unspecified staphylococcus as the cause of diseases classified elsewhere: Secondary | ICD-10-CM | POA: Diagnosis not present

## 2023-09-27 DIAGNOSIS — R7881 Bacteremia: Secondary | ICD-10-CM

## 2023-09-27 DIAGNOSIS — M4626 Osteomyelitis of vertebra, lumbar region: Secondary | ICD-10-CM | POA: Diagnosis not present

## 2023-09-27 MED ORDER — DOXYCYCLINE HYCLATE 100 MG PO TABS: 100.0000 mg | ORAL_TABLET | Freq: Two times a day (BID) | ORAL | 1 refills | Status: DC

## 2023-09-27 NOTE — Progress Notes (Signed)
 [name] [address] [place] [address] Phone: [phone] Subjective:   Susan [name], am serving as a scribe for Dr. Arthea [name].  I'm seeing this patient by the request  of:  Susan [name], Susan [name]  CC: back pain follow up   YEP:Dlagzrupcz  08/06/2023 Patient low back did have a staph infection with osteomyelitis.  Patient has seen infectious disease.  Is scheduled to have another MRI in December with and without contrast to further evaluate the abscess but seems to be improving.  Held on any and manipulation of the lower back at the moment.  Did work on her neck though with some improvement.  Discussed with patient no other changes in medications but just try to titrate off some of the gabapentin  and the diclofenac .  Follow-up with me again in 2 to 3 months     Updated 10/01/2023 Susan [name] is a 48 y.o. female coming in with complaint of back pain. Patient is not having pain but feels like her back fatigues easily. Also notes spasming in hips when walking intermittently.     Seen by ID recently labs normal! MRI in 12/24 looked like significant improvement of the osteomyelitis   Past Medical History:  Diagnosis Date   Abnormal pap 02/2003   CIN 2-3   Basal cell carcinoma of back 07/22/2021   GERD (gastroesophageal reflux disease)    Hyperlipidemia    Migraine with aura    Past Surgical History:  Procedure Laterality Date   BREAST BIOPSY Left 08/04/2022   US  LT BREAST BX W LOC DEV 1ST LESION IMG BX SPEC US  GUIDE 08/04/2022 [facility]   CERVICAL BIOPSY  W/ LOOP ELECTRODE EXCISION  02/2003   CIN 2-3   CHOLECYSTECTOMY  2015   COSMETIC SURGERY  2009   TEE WITHOUT CARDIOVERSION N/A 06/18/2023   Procedure: TRANSESOPHAGEAL ECHOCARDIOGRAM;  Surgeon: Susan Maude JAYSON, MD;  Location: [facility];  Service: Cardiovascular;  Laterality: N/A;   Social History   Socioeconomic History   Marital status: Married     Spouse name: Not on file   Number of children: 0   Years of education: Not on file   Highest education level: Not on file  Occupational History    Employer: [employer]  Tobacco Use   Smoking status: Never   Smokeless tobacco: Never  Vaping Use   Vaping status: Never Used  Substance and Sexual Activity   Alcohol use: Yes    Alcohol/week: 1.0 standard drink of alcohol    Types: 1 Glasses of wine per week    Comment: 1-2 glasses/week   Drug use: No   Sexual activity: Yes    Partners: Male    Birth control/protection: Condom  Other Topics Concern   Not on file  Social History Narrative   Married.    No children.    Social Drivers of Corporate Investment Banker Strain: Not on file  Food Insecurity: No Food Insecurity (06/15/2023)   Hunger Vital Sign    Worried About Running Out of Food in the Last Year: Never true    Ran Out of Food in the Last Year: Never true  Transportation Needs: No Transportation Needs (06/15/2023)   PRAPARE - Administrator, Civil Service (Medical): No    Lack of Transportation (Non-Medical): No  Physical Activity: Not on file  Stress: Not on file  Social Connections: Not on file   No Known Allergies Family History  Problem Relation Age of Onset   HIV Mother    Other Mother    Early death Mother        Age 76   Hypertension Father    Depression Father        Suicide   Early death Brother        Age 10   Drug abuse Brother    Hypertension Maternal Grandmother    Diabetes Maternal Grandmother    Stroke Maternal Grandmother    Heart failure Maternal Grandfather    Heart disease Maternal Grandfather    Diabetes Paternal Grandmother    Depression Paternal Grandfather        Suicide   Colon cancer Neg Hx    Colon polyps Neg Hx    Esophageal cancer Neg Hx    Rectal cancer Neg Hx    Stomach cancer Neg Hx      Current Outpatient Medications (Cardiovascular):    pravastatin  (PRAVACHOL ) 80 MG tablet, TAKE 1 TABLET BY MOUTH EVERY  DAY   Current Outpatient Medications (Analgesics):    diclofenac  (VOLTAREN ) 75 MG EC tablet, TAKE 1 TABLET BY MOUTH TWICE A DAY   Current Outpatient Medications (Other):    doxycycline  (VIBRA -TABS) 100 MG tablet, Take 1 tablet (100 mg total) by mouth 2 (two) times daily. Start on 11/26   gabapentin  (NEURONTIN ) 300 MG capsule, Take 1-2 capsules (300-600 mg total) by mouth 3 (three) times daily.   ondansetron  (ZOFRAN -ODT) 4 MG disintegrating tablet, Take 1 tablet (4 mg total) by mouth every 8 (eight) hours as needed for nausea or vomiting.   pantoprazole  (PROTONIX ) 40 MG tablet, TAKE 1 TABLET BY MOUTH EVERY DAY   traZODone  (DESYREL ) 100 MG tablet, TAKE 1 TABLET BY MOUTH EVERYDAY AT BEDTIME   Review of Systems:  No headache, visual changes, nausea, vomiting, diarrhea, constipation, dizziness, abdominal pain, skin rash, fevers, chills, night sweats, weight loss, swollen lymph nodes, body aches, joint swelling, chest pain, shortness of breath, mood changes. POSITIVE muscle aches  Objective  Blood pressure 116/82, pulse 75, height 5' 8 (1.727 m), weight 212 lb (96.2 kg), SpO2 98%.   General: No apparent distress alert and oriented x3 mood and affect normal, dressed appropriately.  HEENT: Pupils equal, extraocular movements intact  Respiratory: Patient's speak in full sentences and does not appear short of breath  Cardiovascular: No lower extremity edema, non tender, no erythema  Low back exam shows loss of lordosis positive FABER negative straight leg test noted.        Impression and Recommendations:    The above documentation has been reviewed and is accurate and complete Susan Minahan [name], DO

## 2023-09-28 ENCOUNTER — Other Ambulatory Visit: Payer: Self-pay | Admitting: Physician Assistant

## 2023-09-28 LAB — SEDIMENTATION RATE: Sed Rate: 14 mm/h (ref 0–20)

## 2023-09-28 LAB — C-REACTIVE PROTEIN: CRP: 3 mg/L (ref <8.0)

## 2023-10-01 ENCOUNTER — Encounter: Payer: Self-pay | Admitting: Internal Medicine

## 2023-10-01 ENCOUNTER — Ambulatory Visit (INDEPENDENT_AMBULATORY_CARE_PROVIDER_SITE_OTHER): Payer: BC Managed Care – PPO | Admitting: Family Medicine

## 2023-10-01 ENCOUNTER — Encounter: Payer: Self-pay | Admitting: Physician Assistant

## 2023-10-01 VITALS — BP 116/82 | HR 75 | Ht 68.0 in | Wt 212.0 lb

## 2023-10-01 DIAGNOSIS — M545 Low back pain, unspecified: Secondary | ICD-10-CM

## 2023-10-01 DIAGNOSIS — G8929 Other chronic pain: Secondary | ICD-10-CM

## 2023-10-01 NOTE — Patient Instructions (Signed)
 Good to see you Glad you are better [facility] is a good idea Stick to machines and pool Would recommend 5 days bursts of diclofenac See me in 6-8 weeks

## 2023-10-01 NOTE — Assessment & Plan Note (Signed)
 I believe the patient still has some inflammation and do think we should do intermittent diclofenac .  Patient did have the elevation in the liver enzymes and encouraged her not to do any more than 5 days at a time as needed.  Discussed which activities to do and which ones to avoid.  Increase activity slowly.  Follow-up with me again in 6 to 8 weeks and likely will consider the possibility of restarting osteopathic manipulation.  When reviewing the MRI it does appear that patient does have still some inflammation noted of the facet joints in the area but seems with this as well as the regular ESR at the infectious etiology is resolved.  Continues on the doxycycline  per ID.

## 2023-10-02 MED ORDER — PANTOPRAZOLE SODIUM 40 MG PO TBEC: 40.0000 mg | DELAYED_RELEASE_TABLET | Freq: Every day | ORAL | 1 refills | Status: DC

## 2023-10-02 MED ORDER — TRAZODONE HCL 100 MG PO TABS: 100.0000 mg | ORAL_TABLET | Freq: Every day | ORAL | 1 refills | Status: DC

## 2023-10-21 NOTE — Progress Notes (Signed)
RFV: follow up for discitis  Patient ID: Susan [name], female   DOB: 07/03/1976, 48 y.o.   MRN: [id number]  HPI Susan [name] is a 48yo F who was hospiltalized for a staph aureus sepsis associated with epidural abscess has tolerated being on doxycycline. She is getting back to her baseline health. Overall is much improved.denies back pain.  Outpatient Encounter Medications as of 09/27/2023  Medication Sig   gabapentin (NEURONTIN) 300 MG capsule Take 1-2 capsules (300-600 mg total) by mouth 3 (three) times daily.   ondansetron (ZOFRAN-ODT) 4 MG disintegrating tablet Take 1 tablet (4 mg total) by mouth every 8 (eight) hours as needed for nausea or vomiting.   [DISCONTINUED] diclofenac (VOLTAREN) 75 MG EC tablet TAKE 1 TABLET BY MOUTH TWICE A DAY   [DISCONTINUED] doxycycline (VIBRA-TABS) 100 MG tablet Take 1 tablet (100 mg total) by mouth 2 (two) times daily. Start on 11/26   [DISCONTINUED] pantoprazole (PROTONIX) 40 MG tablet TAKE 1 TABLET BY MOUTH EVERY DAY   [DISCONTINUED] traZODone (DESYREL) 100 MG tablet TAKE 1 TABLET BY MOUTH EVERYDAY AT BEDTIME   doxycycline (VIBRA-TABS) 100 MG tablet Take 1 tablet (100 mg total) by mouth 2 (two) times daily. Start on 11/26   pravastatin (PRAVACHOL) 80 MG tablet TAKE 1 TABLET BY MOUTH EVERY DAY   No facility-administered encounter medications on file as of 09/27/2023.     Patient Active Problem List   Diagnosis Date Noted   Malnutrition of moderate degree 06/19/2023   Staphylococcus aureus infection 06/19/2023   Bacteremia with signs of infection 06/18/2023   Discitis 06/15/2023   Right lateral epicondylitis 04/12/2023   Plantar fasciitis of left foot 08/17/2022   Ascending aorta dilatation -- found on CT Sep 2023 --> Recheck CT in Sep 2024 05/31/2022   Chest wall pain 04/20/2022   Traumatic hematoma of lower leg, left, initial encounter 11/03/2021   Basal cell carcinoma of back 07/22/2021   Dermatofibroma 07/22/2021   Grover's disease  07/22/2021   History of malignant neoplasm of skin 07/22/2021   Lentigo 07/22/2021   Melanocytic nevi of trunk 07/22/2021   Neoplasm of uncertain behavior of skin 07/22/2021   Other skin changes due to chronic exposure to nonionizing radiation 07/22/2021   Skin tag 07/22/2021   Low back pain 08/03/2020   Nonallopathic lesion of sacral region 08/03/2020   Nonallopathic lesion of thoracic region 08/03/2020   Nonallopathic lesion of lumbar region 08/03/2020   Class 1 obesity due to excess calories without serious comorbidity with body mass index (BMI) of 33.0 to 33.9 in adult 11/17/2017   Hypercholesterolemia, not on statin 08/18/2017   H/O abnormal cervical Papanicolaou smear 01/06/2013     There are no preventive care reminders to display for this patient.   Review of Systems 12 point ros is negative Physical Exam   BP 129/85   Pulse (!) 58   Temp 98.1 F (36.7 C) (Temporal)   Ht 5\' 8"  (1.727 m)   Wt 212 lb (96.2 kg)   SpO2 98%   BMI 32.23 kg/m   Physical Exam  Constitutional:  oriented to person, place, and time. appears well-developed and well-nourished. No distress.  HENT: [place]/AT, PERRLA, no scleral icterus Mouth/Throat: Oropharynx is clear and moist. No oropharyngeal exudate.  Cardiovascular: Normal rate, regular rhythm and normal heart sounds. Exam reveals no gallop and no friction rub.  No murmur heard.  Pulmonary/Chest: Effort normal and breath sounds normal. No respiratory distress.  has no wheezes.  Neurological: alert and oriented to person,  place, and time.  Skin: Skin is warm and dry. No rash noted. No erythema.  Psychiatric: a normal mood and affect.  behavior is normal.   CBC Lab Results  Component Value Date   WBC 8.3 08/13/2023   RBC 4.01 08/13/2023   HGB 11.3 (L) 08/13/2023   HCT 34.8 (L) 08/13/2023   PLT 411.0 (H) 08/13/2023   MCV 86.8 08/13/2023   MCH 27.9 06/20/2023   MCHC 32.6 08/13/2023   RDW 16.5 (H) 08/13/2023   LYMPHSABS 2.1 08/13/2023    MONOABS 0.6 08/13/2023   EOSABS 0.1 08/13/2023    BMET Lab Results  Component Value Date   NA 138 08/13/2023   K 3.9 08/13/2023   CL 102 08/13/2023   CO2 27 08/13/2023   GLUCOSE 103 (H) 08/13/2023   BUN 15 08/13/2023   CREATININE 0.89 08/13/2023   CALCIUM 9.4 08/13/2023   GFRNONAA >60 06/20/2023   GFRAA >60 09/01/2019   Lab Results  Component Value Date   ESRSEDRATE 14 09/27/2023   Lab Results  Component Value Date   CRP 3.0 09/27/2023   Narrative & Impression  CLINICAL DATA:  48 year old female with a history of L5-S1 discitis osteomyelitis in September. Subsequent encounter.   EXAM: MRI LUMBAR SPINE WITHOUT AND WITH CONTRAST   TECHNIQUE: Multiplanar and multiecho pulse sequences of the lumbar spine were obtained without and with intravenous contrast.   CONTRAST:  7mL GADAVIST GADOBUTROL 1 MMOL/ML IV SOLN   COMPARISON:  Lumbar MRI 07/22/2023 and earlier.   FINDINGS: Segmentation: Designated as normal as before. Vestigial S1-S2 disc space.   Alignment:  Maintained lumbar lordosis.   Vertebrae: L5 and S1 endplate erosions since the initial MRI in September, with regressed but not completely resolved vertebral marrow edema since November. Patchy residual STIR hyperintensity and enhancement surrounding the endplates there. Partial enhancement of the disc space, which has become severely narrow due.   No progressive marrow signal changes. The adjacent L4 and S2 levels remain normal. Background bone mineralization remains normal.   Conus medullaris and cauda equina: Conus extends to the L2 level. No lower spinal cord or conus signal abnormality. No abnormal intradural enhancement now and largely resolved epidural inflammation at the lumbosacral junction since September. No discrete residual epidural fluid collection. Mild architectural distortion and clumping of the cauda equina nerve roots at L4 and L5 compatible with a degree of arachnoiditis.   Paraspinal  and other soft tissues: Largely resolved paraspinal soft tissue inflammation since September. No new or progressive findings. Negative visible abdominal viscera.   Disc levels:   T11-T12 through L3-L4 remain negative for age.   L4-L5 disc desiccation, mild disc bulging, small posterior annular fissure of the disc. Mild facet hypertrophy. No stenosis there.   L5-S1: Evolution of discitis osteomyelitis with severe disc space loss. Endplate and facet spurring. No significant spinal stenosis. Mild mostly right side lateral recess stenosis (right S1 nerve level). Moderate bilateral L5 neural foraminal stenosis which has progressed since September.   IMPRESSION: 1. Improving/resolving L5-S1 discitis osteomyelitis. Some residual marrow edema but no new or progressive inflammation there. No residual abscess. Mild residual clumping and architectural distortion of the lower cauda equina nerve roots. Erosion of the endplates and severe disc space loss since September combined for moderate bilateral L5 neural foraminal stenosis, mild right lateral recess stenosis, but no significant spinal stenosis there.   2. Other lumbar levels are stable.  Mild degeneration at L4-L5.       Assessment and Plan Staph aureus bacteremia/epidural  abscess= repeat mri shows improvement. Recommend to finish her bottle on hand we will see back in 6 wk and compare to the current labs. Likely be able to stop at next visit.  Will check sed rate and crp  Amendment = inflammatory markers are WNL, which is reassuring  I have personally spent involved in face-to-face and non-face-to-face activities for this patient on the day of the visit. Professional time spent includes the following activities: Preparing to see the patient (review of tests), Obtaining and/or reviewing separately obtained history (admission/discharge record), Performing a medically appropriate examination and/or evaluation , Ordering  medications/tests/procedures, referring and communicating with other health care professionals, Documenting clinical information in the EMR, Independently interpreting results (not separately reported), Communicating results to the patient/family/caregiver, Counseling and educating the patient/family/caregiver and Care coordination (not separately reported).

## 2023-10-26 ENCOUNTER — Other Ambulatory Visit: Payer: Self-pay | Admitting: Physician Assistant

## 2023-11-08 NOTE — Progress Notes (Signed)
 Susan [name] [address] [place] [address] Phone: [phone] Subjective:   Susan [name] am a scribe for Susan [name].   I'm seeing this patient by the request  of:  Susan [name], Susan [name]  CC: Low back pain follow-up  [id number]  10/01/2023 I believe the patient still has some inflammation and do think we should do intermittent diclofenac.  Patient did have the elevation in the liver enzymes and encouraged her not to do any more than 5 days at a time as needed.  Discussed which activities to do and which ones to avoid.  Increase activity slowly.  Follow-up with me again in 6 to 8 weeks and likely will consider the possibility of restarting osteopathic manipulation.   When reviewing the MRI it does appear that patient does have still some inflammation noted of the facet joints in the area but seems with this as well as the regular ESR at the infectious etiology is resolved.  Continues on the doxycycline per ID.      Update 11/12/2023 Susan [name] is a 48 y.o. female coming in with complaint of LBP. Patient states it is good today. A little sore from walking around her neighborhood. Had a misstep, nothing major but does feel the soreness from it today.  Has seen infectious disease recently and likely will be off the antibiotics very soon from her osteomyelitis.       Past Medical History:  Diagnosis Date   Abnormal pap 02/2003   CIN 2-3   Basal cell carcinoma of back 07/22/2021   GERD (gastroesophageal reflux disease)    Hyperlipidemia    Migraine with aura    Past Surgical History:  Procedure Laterality Date   BREAST BIOPSY Left 08/04/2022   [place] LT BREAST BX W LOC DEV 1ST LESION IMG BX SPEC US GUIDE 08/04/2022 [facility]   CERVICAL BIOPSY  W/ LOOP ELECTRODE EXCISION  02/2003   CIN 2-3   CHOLECYSTECTOMY  2015   COSMETIC SURGERY  2009   TEE WITHOUT CARDIOVERSION N/A 06/18/2023   Procedure: TRANSESOPHAGEAL  ECHOCARDIOGRAM;  Surgeon: Susan Stade, Susan [name];  Location: [facility];  Service: Cardiovascular;  Laterality: N/A;   Social History   Socioeconomic History   Marital status: Married    Spouse name: Not on file   Number of children: 0   Years of education: Not on file   Highest education level: Not on file  Occupational History    Employer: [employer]  Tobacco Use   Smoking status: Never   Smokeless tobacco: Never  Vaping Use   Vaping status: Never Used  Substance and Sexual Activity   Alcohol use: Yes    Alcohol/week: 1.0 standard drink of alcohol    Types: 1 Glasses of wine per week    Comment: 1-2 glasses/week   Drug use: No   Sexual activity: Yes    Partners: Male    Birth control/protection: Condom  Other Topics Concern   Not on file  Social History Narrative   Married.    No children.    Social Drivers of Corporate investment banker Strain: Not on file  Food Insecurity: No Food Insecurity (06/15/2023)   Hunger Vital Sign    Worried About Running Out of Food in the Last Year: Never true    Ran Out of Food in the Last Year: Never true  Transportation Needs: No Transportation Needs (06/15/2023)   PRAPARE - Transportation    Lack  of Transportation (Medical): No    Lack of Transportation (Non-Medical): No  Physical Activity: Not on file  Stress: Not on file  Social Connections: Not on file   No Known Allergies Family History  Problem Relation Age of Onset   HIV Mother    Other Mother    Early death Mother        Age 87   Hypertension Father    Depression Father        Suicide   Early death Brother        Age 81   Drug abuse Brother    Hypertension Maternal Grandmother    Diabetes Maternal Grandmother    Stroke Maternal Grandmother    Heart failure Maternal Grandfather    Heart disease Maternal Grandfather    Diabetes Paternal Grandmother    Depression Paternal Grandfather        Suicide   Colon cancer Neg Hx    Colon polyps Neg Hx    Esophageal  cancer Neg Hx    Rectal cancer Neg Hx    Stomach cancer Neg Hx      Current Outpatient Medications (Cardiovascular):    pravastatin (PRAVACHOL) 80 MG tablet, TAKE 1 TABLET BY MOUTH EVERY DAY   Current Outpatient Medications (Analgesics):    diclofenac (VOLTAREN) 75 MG EC tablet, TAKE 1 TABLET BY MOUTH TWICE A DAY   Current Outpatient Medications (Other):    doxycycline (VIBRA-TABS) 100 MG tablet, Take 1 tablet (100 mg total) by mouth 2 (two) times daily. Start on 11/26   gabapentin (NEURONTIN) 300 MG capsule, Take 1-2 capsules (300-600 mg total) by mouth 3 (three) times daily.   ondansetron (ZOFRAN-ODT) 4 MG disintegrating tablet, Take 1 tablet (4 mg total) by mouth every 8 (eight) hours as needed for nausea or vomiting.   pantoprazole (PROTONIX) 40 MG tablet, Take 1 tablet (40 mg total) by mouth daily.   traZODone (DESYREL) 100 MG tablet, Take 1 tablet (100 mg total) by mouth at bedtime.   Reviewed prior external information including notes and imaging from  primary care provider As well as notes that were available from care everywhere and other healthcare systems.  Past medical history, social, surgical and family history all reviewed in electronic medical record.  No pertanent information unless stated regarding to the chief complaint.   Review of Systems:  No headache, visual changes, nausea, vomiting, diarrhea, constipation, dizziness, abdominal pain, skin rash, fevers, chills, night sweats, weight loss, swollen lymph nodes, body aches, joint swelling, chest pain, shortness of breath, mood changes. POSITIVE muscle aches  Objective  Blood pressure 110/70, pulse 65, height 5\' 8"  (1.727 m), SpO2 96%.   General: No apparent distress alert and oriented x3 mood and affect normal, dressed appropriately.  HEENT: Pupils equal, extraocular movements intact  Respiratory: Patient's speak in full sentences and does not appear short of breath  Cardiovascular: No lower extremity edema,  non tender, no erythema  Low back exam does have some loss lordosis noted.  Some tenderness to palpation in the paraspinal musculature. Tightness with Pearlean Brownie right greater than left.  Patient though is moving significantly better at the moment.  Osteopathic findings C2 flexed rotated and side bent right C4 flexed rotated and side bent left C6 flexed rotated and side bent left T3 extended rotated and side bent right inhaled third rib T9 extended rotated and side bent left L2 flexed rotated and side bent right Sacrum right on right    Impression and Recommendations:  Low back  pain Low back pain seems to be more musculoskeletal than it is really anything to do with the discitis or osteomyelitis.  Discussed with patient about icing regimen and home exercises, discussed continuing core strengthening exercises.  Discussed continuing to work on weight.  Will follow-up again in 6 to 8 weeks.    Decision today to treat with OMT was based on Physical Exam  After verbal consent patient was treated with HVLA, ME, FPR techniques in cervical, thoracic, rib, lumbar and sacral areas, all areas are chronic   Patient tolerated the procedure well with improvement in symptoms  Patient given exercises, stretches and lifestyle modifications  See medications in patient instructions if given  Patient will follow up in 4-8 weeks  The above documentation has been reviewed and is accurate and complete Susan Saa, DO

## 2023-11-09 DIAGNOSIS — L814 Other melanin hyperpigmentation: Secondary | ICD-10-CM | POA: Diagnosis not present

## 2023-11-09 DIAGNOSIS — L821 Other seborrheic keratosis: Secondary | ICD-10-CM | POA: Diagnosis not present

## 2023-11-09 DIAGNOSIS — D225 Melanocytic nevi of trunk: Secondary | ICD-10-CM | POA: Diagnosis not present

## 2023-11-09 DIAGNOSIS — L578 Other skin changes due to chronic exposure to nonionizing radiation: Secondary | ICD-10-CM | POA: Diagnosis not present

## 2023-11-12 ENCOUNTER — Ambulatory Visit: Payer: BC Managed Care – PPO | Admitting: Internal Medicine

## 2023-11-12 ENCOUNTER — Encounter: Payer: Self-pay | Admitting: Internal Medicine

## 2023-11-12 ENCOUNTER — Other Ambulatory Visit: Payer: Self-pay

## 2023-11-12 ENCOUNTER — Encounter: Payer: Self-pay | Admitting: Family Medicine

## 2023-11-12 ENCOUNTER — Ambulatory Visit (INDEPENDENT_AMBULATORY_CARE_PROVIDER_SITE_OTHER): Payer: BC Managed Care – PPO | Admitting: Family Medicine

## 2023-11-12 VITALS — BP 110/70 | HR 65 | Ht 68.0 in

## 2023-11-12 VITALS — BP 122/84 | HR 65 | Ht 68.0 in | Wt 216.0 lb

## 2023-11-12 DIAGNOSIS — M9901 Segmental and somatic dysfunction of cervical region: Secondary | ICD-10-CM | POA: Diagnosis not present

## 2023-11-12 DIAGNOSIS — B9561 Methicillin susceptible Staphylococcus aureus infection as the cause of diseases classified elsewhere: Secondary | ICD-10-CM | POA: Diagnosis not present

## 2023-11-12 DIAGNOSIS — M9902 Segmental and somatic dysfunction of thoracic region: Secondary | ICD-10-CM | POA: Diagnosis not present

## 2023-11-12 DIAGNOSIS — M545 Low back pain, unspecified: Secondary | ICD-10-CM | POA: Diagnosis not present

## 2023-11-12 DIAGNOSIS — M9908 Segmental and somatic dysfunction of rib cage: Secondary | ICD-10-CM

## 2023-11-12 DIAGNOSIS — G8929 Other chronic pain: Secondary | ICD-10-CM

## 2023-11-12 DIAGNOSIS — M9903 Segmental and somatic dysfunction of lumbar region: Secondary | ICD-10-CM

## 2023-11-12 DIAGNOSIS — M9904 Segmental and somatic dysfunction of sacral region: Secondary | ICD-10-CM

## 2023-11-12 DIAGNOSIS — M4626 Osteomyelitis of vertebra, lumbar region: Secondary | ICD-10-CM

## 2023-11-12 NOTE — Assessment & Plan Note (Signed)
 Low back pain seems to be more musculoskeletal than it is really anything to do with the discitis or osteomyelitis.  Discussed with patient about icing regimen and home exercises, discussed continuing core strengthening exercises.  Discussed continuing to work on weight.  Will follow-up again in 6 to 8 weeks.

## 2023-11-12 NOTE — Patient Instructions (Addendum)
 Good to see you. Sorry for being late. Return in 2 months.

## 2023-11-12 NOTE — Progress Notes (Signed)
 Patient ID: Susan [name], female   DOB: June 01, 1976, 48 y.o.   MRN: [id number]  HPI  48yo F with hx of MSSA epidural/lumbar OM treated with iv abtx through 08/12/24, then had repeat MRIN at end of IV course which showed improvement. Due to elevated inflammatory markers and imaging, she was extended on oral abtx for which she is tolerating.she states that she recently had Respiratory viral infection with head and sinus congestion now recovered  Lab Results  Component Value Date   ESRSEDRATE 14 09/27/2023   Lab Results  Component Value Date   CRP 3.0 09/27/2023     Outpatient Encounter Medications as of 11/12/2023  Medication Sig   diclofenac (VOLTAREN) 75 MG EC tablet TAKE 1 TABLET BY MOUTH TWICE A DAY   doxycycline (VIBRA-TABS) 100 MG tablet Take 1 tablet (100 mg total) by mouth 2 (two) times daily. Start on 11/26   gabapentin (NEURONTIN) 300 MG capsule Take 1-2 capsules (300-600 mg total) by mouth 3 (three) times daily.   ondansetron (ZOFRAN-ODT) 4 MG disintegrating tablet Take 1 tablet (4 mg total) by mouth every 8 (eight) hours as needed for nausea or vomiting.   pantoprazole (PROTONIX) 40 MG tablet Take 1 tablet (40 mg total) by mouth daily.   pravastatin (PRAVACHOL) 80 MG tablet TAKE 1 TABLET BY MOUTH EVERY DAY   traZODone (DESYREL) 100 MG tablet Take 1 tablet (100 mg total) by mouth at bedtime.   No facility-administered encounter medications on file as of 11/12/2023.     Patient Active Problem List   Diagnosis Date Noted   Malnutrition of moderate degree 06/19/2023   Staphylococcus aureus infection 06/19/2023   Bacteremia with signs of infection 06/18/2023   Discitis 06/15/2023   Right lateral epicondylitis 04/12/2023   Plantar fasciitis of left foot 08/17/2022   Ascending aorta dilatation -- found on CT Sep 2023 --> Recheck CT in Sep 2024 05/31/2022   Chest wall pain 04/20/2022   Traumatic hematoma of lower leg, left, initial encounter 11/03/2021   Basal  cell carcinoma of back 07/22/2021   Dermatofibroma 07/22/2021   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   History of malignant neoplasm of skin 07/22/2021   Lentigo 07/22/2021   Melanocytic nevi of trunk 07/22/2021   Neoplasm of uncertain behavior of skin 07/22/2021   Other skin changes due to chronic exposure to nonionizing radiation 07/22/2021   Skin tag 07/22/2021   Low back pain 08/03/2020   Nonallopathic lesion of sacral region 08/03/2020   Nonallopathic lesion of thoracic region 08/03/2020   Nonallopathic lesion of lumbar region 08/03/2020   Class 1 obesity due to excess calories without serious comorbidity with body mass index (BMI) of 33.0 to 33.9 in adult 11/17/2017   Hypercholesterolemia, not on statin 08/18/2017   H/O abnormal cervical Papanicolaou smear 01/06/2013     There are no preventive care reminders to display for this patient.   Review of Systems 12 point ros is otherwise negative Physical Exam   BP 122/84   Pulse 65   Ht 5\' 8"  (1.727 m)   Wt 216 lb (98 kg)   BMI 32.84 kg/m    Physical Exam  Constitutional:  oriented to person, place, and time. appears well-developed and well-nourished. No distress.  HENT: [place]/AT, PERRLA, no scleral icterus Mouth/Throat: Oropharynx is clear and moist. No oropharyngeal exudate.  Neurological: alert and oriented to person, place, and time.  Skin: Skin is warm and dry. No rash noted. No erythema.  Psychiatric: a normal mood and affect.  behavior is normal.    CBC Lab Results  Component Value Date   WBC 8.3 08/13/2023   RBC 4.01 08/13/2023   HGB 11.3 (L) 08/13/2023   HCT 34.8 (L) 08/13/2023   PLT 411.0 (H) 08/13/2023   MCV 86.8 08/13/2023   MCH 27.9 06/20/2023   MCHC 32.6 08/13/2023   RDW 16.5 (H) 08/13/2023   LYMPHSABS 2.1 08/13/2023   MONOABS 0.6 08/13/2023   EOSABS 0.1 08/13/2023    BMET Lab Results  Component Value Date   NA 138 08/13/2023   K 3.9 08/13/2023   CL 102 08/13/2023   CO2 27 08/13/2023   GLUCOSE 103  (H) 08/13/2023   BUN 15 08/13/2023   CREATININE 0.89 08/13/2023   CALCIUM 9.4 08/13/2023   GFRNONAA >60 06/20/2023   GFRAA >60 09/01/2019   Lab Results  Component Value Date   ESRSEDRATE 6 11/12/2023   Lab Results  Component Value Date   CRP <3.0 11/12/2023      Assessment and Plan MSSA vertebal osteomyelitis, epidural abscess = - we will check sed rate, and crp - now on doxy 100mg  bid. -- see if can stop if markers normal again  Addendum = since inflammatory markers are normalized x 2. We will stop abtx. Observe off of abtx.

## 2023-11-13 LAB — C-REACTIVE PROTEIN: CRP: <3 mg/L (ref <8.0)

## 2023-11-13 LAB — SEDIMENTATION RATE: Sed Rate: 6 mm/h (ref 0–20)

## 2023-11-23 ENCOUNTER — Other Ambulatory Visit: Payer: Self-pay | Admitting: Internal Medicine

## 2023-11-23 ENCOUNTER — Other Ambulatory Visit: Payer: Self-pay | Admitting: Physician Assistant

## 2023-11-23 ENCOUNTER — Telehealth: Payer: Self-pay

## 2023-11-23 NOTE — Telephone Encounter (Signed)
 Per Dr. Drue Second labs look great ok to stop doxycycline. Called patient to relay message no answer left voice message.

## 2023-12-11 ENCOUNTER — Encounter: Payer: Self-pay | Admitting: Internal Medicine

## 2023-12-11 ENCOUNTER — Ambulatory Visit: Payer: BC Managed Care – PPO | Admitting: Internal Medicine

## 2023-12-11 VITALS — BP 120/62 | HR 84 | Ht 68.0 in | Wt 215.0 lb

## 2023-12-11 DIAGNOSIS — K219 Gastro-esophageal reflux disease without esophagitis: Secondary | ICD-10-CM | POA: Diagnosis not present

## 2023-12-11 DIAGNOSIS — E669 Obesity, unspecified: Secondary | ICD-10-CM

## 2023-12-11 DIAGNOSIS — Z860101 Personal history of adenomatous and serrated colon polyps: Secondary | ICD-10-CM | POA: Diagnosis not present

## 2023-12-11 DIAGNOSIS — R7989 Other specified abnormal findings of blood chemistry: Secondary | ICD-10-CM | POA: Diagnosis not present

## 2023-12-11 NOTE — Progress Notes (Signed)
 HISTORY OF PRESENT ILLNESS:  Susan [name] is a 48 y.o. female, HR personnel for [employer], who is sent today by her primary care provider regarding elevated liver tests and abnormal ultrasound raising question of cirrhosis.  She is a previous patient of Dr. Russella Dar who performed screening colonoscopy in January 2022.  She was found to have an adenomatous colon polyp which follow-up 7 years was recommended.  The patient was in her usual state of health until this past summer when while hiking in [place], she fell.  Injured her back.  Eventually developed staph osteomyelitis of the spine for which she was hospitalized September 2024.  At that time her liver tests were all significantly elevated.  Prothrombin time was normal.  Hepatitis C negative.  Her infection was treated and she improved over time.  While she was in the hospital abdominal ultrasound was performed which stated that the liver had a mildly nodular contour.  Most recent liver tests in November were normal.  All liver tests prior to her hospitalization, dating back 4 years, were normal.  She has had hepatitis C tested on several occasions which has been negative.  Iron studies have been low.  No indication for hemochromatosis.  No family history of thyroid disease.  She drinks 1 alcoholic beverage per day.  REVIEW OF SYSTEMS:  All non-GI ROS negative except for back pain  Past Medical History:  Diagnosis Date   Abnormal pap 02/2003   CIN 2-3   Basal cell carcinoma of back 07/22/2021   Elevated cholesterol    Gallstones    GERD (gastroesophageal reflux disease)    Hyperlipidemia    Migraine with aura     Past Surgical History:  Procedure Laterality Date   BREAST BIOPSY Left 08/04/2022   [place] LT BREAST BX W LOC DEV 1ST LESION IMG BX SPEC US GUIDE 08/04/2022 [facility]   CERVICAL BIOPSY  W/ LOOP ELECTRODE EXCISION  02/2003   CIN 2-3   CHOLECYSTECTOMY  2015   COSMETIC SURGERY  2009   TEE WITHOUT  CARDIOVERSION N/A 06/18/2023   Procedure: TRANSESOPHAGEAL ECHOCARDIOGRAM;  Surgeon: Wendall Stade, MD;  Location: [facility];  Service: Cardiovascular;  Laterality: N/A;    Social History Windy Kalata  reports that she has never smoked. She has never used smokeless tobacco. She reports current alcohol use of about 1.0 standard drink of alcohol per week. She reports that she does not use drugs.  family history includes Depression in her father and paternal grandfather; Diabetes in her maternal grandmother and paternal grandmother; Drug abuse in her brother; Early death in her brother and mother; HIV in her mother; Heart disease in her maternal grandfather; Heart failure in her maternal grandfather; Hypertension in her father and maternal grandmother; Other in her mother; Stroke in her maternal grandmother.  No Known Allergies     PHYSICAL EXAMINATION: Vital signs: BP 120/62   Pulse 84   Ht 5\' 8"  (1.727 m)   Wt 215 lb (97.5 kg)   BMI 32.69 kg/m   Constitutional: generally well-appearing, no acute distress Psychiatric: alert and oriented x3, cooperative Eyes: extraocular movements intact, anicteric, conjunctiva pink Mouth: oral pharynx moist, no lesions Neck: supple no lymphadenopathy Cardiovascular: heart regular rate and rhythm, no murmur Lungs: clear to auscultation bilaterally Abdomen: soft, nontender, nondistended, no obvious ascites, no peritoneal signs, normal bowel sounds, no organomegaly Rectal: Omitted Extremities: no clubbing, cyanosis, or lower extremity edema bilaterally Skin: no lesions on visible extremities Neuro: No  focal deficits. No asterixis.    ASSESSMENT:  1.  Transient elevation of liver test secondary to osteomyelitis.  Resolved.  No evidence for acute liver disease.  No evidence for cirrhosis. 2.  Ultrasound questioning mild nodularity.  Ultrasonography is for sensitivity and specificity for cirrhosis.  No indication based on clinical or  laboratory evaluations. 3.  History of adenomatous colon polyp 4.  GERD.  Controlled with PPI 5.  General Medical problems including obesity  PLAN:  1.  Discussed the above with the patient in detail 2.  No further workup indicated 3.  Exercise and weight loss 4.  Reflux precautions 5.  Continue PPI 6.  Surveillance colonoscopy around 2029 7.  Resume general medical care with PCP.  GI follow-up as needed A total time of 45 minutes was spent preparing to see the patient (majority of the time), obtaining, performing medically appropriate exam, counseling educating patient regarding the above listed issues, and documenting

## 2023-12-11 NOTE — Patient Instructions (Addendum)
 Please follow up as needed.  _______________________________________________________  If your blood pressure at your visit was 140/90 or greater, please contact your primary care physician to follow up on this.  _______________________________________________________  If you are age 49 or older, your body mass index should be between 23-30. Your Body mass index is 32.69 kg/m. If this is out of the aforementioned range listed, please consider follow up with your Primary Care Provider.  If you are age 66 or younger, your body mass index should be between 19-25. Your Body mass index is 32.69 kg/m. If this is out of the aformentioned range listed, please consider follow up with your Primary Care Provider.   ________________________________________________________  The [facility] providers would like to encourage you to use [facility] to communicate with providers for non-urgent requests or questions.  Due to long hold times on the telephone, sending your provider a message by [facility] may be a faster and more efficient way to get a response.  Please allow 48 business hours for a response.  Please remember that this is for non-urgent requests.  _______________________________________________________

## 2023-12-26 ENCOUNTER — Other Ambulatory Visit: Payer: Self-pay | Admitting: *Deleted

## 2023-12-26 MED ORDER — DICLOFENAC SODIUM 75 MG PO TBEC: 75.0000 mg | DELAYED_RELEASE_TABLET | Freq: Two times a day (BID) | ORAL | 0 refills | Status: DC

## 2023-12-31 ENCOUNTER — Other Ambulatory Visit: Payer: Self-pay | Admitting: Physician Assistant

## 2024-01-09 NOTE — Progress Notes (Unsigned)
 [name] [address] [place] [address] Phone: [phone] Subjective:   Susan [name], am serving as a scribe for Dr. Ronnell Coins.  I'm seeing this patient by the request  of:  [name], [name]  CC: Back and neck pain follow-up  SAY:[id number]  Susan [name] is a 48 y.o. female coming in with complaint of back and neck pain. OMT 11/12/2023.  Patient did have a discitis or osteomyelitis.  Seem to be fully recovered at last exam.  Patient states that in the past 2 weeks she has had more spasm in anterior and lateral hips. Occurring more frequently. When it rains she also notes pain in the hips. Voltaren  tabs are helpful and she uses them in bursts.     Medications patient has been prescribed: None  Taking:         Reviewed prior external information including notes and imaging from previsou exam, outside providers and external EMR if available.   As well as notes that were available from care everywhere and other healthcare systems.  Past medical history, social, surgical and family history all reviewed in electronic medical record.  No pertanent information unless stated regarding to the chief complaint.   Past Medical History:  Diagnosis Date   Abnormal pap 02/2003   CIN 2-3   Basal cell carcinoma of back 07/22/2021   Elevated cholesterol    Gallstones    GERD (gastroesophageal reflux disease)    Hyperlipidemia    Migraine with aura     No Known Allergies   Review of Systems:  No headache, visual changes, nausea, vomiting, diarrhea, constipation, dizziness, abdominal pain, skin rash, fevers, chills, night sweats, weight loss, swollen lymph nodes, body aches, joint swelling, chest pain, shortness of breath, mood changes. POSITIVE muscle aches  Objective  Blood pressure 110/76, pulse 69, height 5\' 8"  (1.727 m), weight 214 lb (97.1 kg), SpO2 97%.   General: No apparent distress alert and oriented x3 mood and  affect normal, dressed appropriately.  HEENT: Pupils equal, extraocular movements intact  Respiratory: Patient's speak in full sentences and does not appear short of breath  Cardiovascular: No lower extremity edema, non tender, no erythema  Gait normal  MSK:  Back does have some loss lordosis.  Tightness noted more in the thoracolumbar juncture today.  Numbness noted partially over the lateral calf.  Great strength of the lower extremities.  Osteopathic findings  C2 flexed rotated and side bent right C6 flexed rotated and side bent left T3 extended rotated and side bent right inhaled rib T7 extended rotated and side bent left L1 flexed rotated and side bent right Sacrum right on right       Assessment and Plan:  Low back pain Low back does have some loss lordosis noted.  Tenderness to palpation in the paraspinal musculature.  Tightness noted follow-up again in 6 to 8 weeks patient is still hesitant to get is moving on a regular basis.  Discussed icing regimen and home exercises as we have done before but will refer patient to formal physical therapy to start getting her to increase activity in a more safe place.    Nonallopathic problems  Decision today to treat with OMT was based on Physical Exam  After verbal consent patient was treated with HVLA, ME, FPR techniques in cervical, rib, thoracic, lumbar, and sacral  areas  Patient tolerated the procedure well with improvement in symptoms  Patient given exercises, stretches and lifestyle modifications  See medications in patient instructions if given  Patient will follow up in 4-8 weeks    The above documentation has been reviewed and is accurate and complete Keondra Haydu M Shanicqua Coldren, DO       Note: This dictation was prepared with Dragon dictation along with smaller phrase technology. Any transcriptional errors that result from this process are unintentional.

## 2024-01-10 ENCOUNTER — Encounter: Payer: Self-pay | Admitting: Family Medicine

## 2024-01-10 ENCOUNTER — Ambulatory Visit: Payer: BC Managed Care – PPO | Admitting: Family Medicine

## 2024-01-10 VITALS — BP 110/76 | HR 69 | Ht 68.0 in | Wt 214.0 lb

## 2024-01-10 DIAGNOSIS — M9904 Segmental and somatic dysfunction of sacral region: Secondary | ICD-10-CM | POA: Diagnosis not present

## 2024-01-10 DIAGNOSIS — M9902 Segmental and somatic dysfunction of thoracic region: Secondary | ICD-10-CM | POA: Diagnosis not present

## 2024-01-10 DIAGNOSIS — M9901 Segmental and somatic dysfunction of cervical region: Secondary | ICD-10-CM

## 2024-01-10 DIAGNOSIS — G8929 Other chronic pain: Secondary | ICD-10-CM

## 2024-01-10 DIAGNOSIS — M9908 Segmental and somatic dysfunction of rib cage: Secondary | ICD-10-CM | POA: Diagnosis not present

## 2024-01-10 DIAGNOSIS — M9903 Segmental and somatic dysfunction of lumbar region: Secondary | ICD-10-CM | POA: Diagnosis not present

## 2024-01-10 DIAGNOSIS — M545 Low back pain, unspecified: Secondary | ICD-10-CM | POA: Diagnosis not present

## 2024-01-10 NOTE — Patient Instructions (Signed)
 Good to see you PT [facility] See me in 7-8 weeks

## 2024-01-10 NOTE — Assessment & Plan Note (Addendum)
 Low back does have some loss lordosis noted.  Tenderness to palpation in the paraspinal musculature.  Tightness noted follow-up again in 6 to 8 weeks patient is still hesitant to get is moving on a regular basis.  Discussed icing regimen and home exercises as we have done before but will refer patient to formal physical therapy to start getting her to increase activity in a more safe place.

## 2024-01-26 ENCOUNTER — Other Ambulatory Visit: Payer: Self-pay | Admitting: Physician Assistant

## 2024-01-29 ENCOUNTER — Encounter: Payer: Self-pay | Admitting: Physical Therapy

## 2024-01-29 ENCOUNTER — Ambulatory Visit (INDEPENDENT_AMBULATORY_CARE_PROVIDER_SITE_OTHER): Admitting: Physical Therapy

## 2024-01-29 DIAGNOSIS — M6281 Muscle weakness (generalized): Secondary | ICD-10-CM

## 2024-01-29 DIAGNOSIS — M5459 Other low back pain: Secondary | ICD-10-CM

## 2024-01-29 NOTE — Therapy (Signed)
 OUTPATIENT PHYSICAL THERAPY THORACOLUMBAR EVALUATION      Patient Name: Susan [name] MRN: [id number] DOB:[birth year], 48 y.o., female Today's Date: 01/29/2024  END OF SESSION:  PT End of Session - 01/29/24 1304     Visit Number 1    Number of Visits 16    Date for PT Re-Evaluation 04/22/24    PT Start Time 1305    PT Stop Time 1341    PT Time Calculation (min) 36 min    Activity Tolerance Patient tolerated treatment well                  Past Medical History:  Diagnosis Date   Abnormal pap 02/2003   CIN 2-3   Basal cell carcinoma of back 07/22/2021   Elevated cholesterol    Gallstones    GERD (gastroesophageal reflux disease)    Hyperlipidemia    Migraine with aura    Past Surgical History:  Procedure Laterality Date   BREAST BIOPSY Left 08/04/2022   US  LT BREAST BX W LOC DEV 1ST LESION IMG BX SPEC US  GUIDE 08/04/2022 [facility]   CERVICAL BIOPSY  W/ LOOP ELECTRODE EXCISION  02/2003   CIN 2-3   CHOLECYSTECTOMY  2015   COSMETIC SURGERY  2009   TEE WITHOUT CARDIOVERSION N/A 06/18/2023   Procedure: TRANSESOPHAGEAL ECHOCARDIOGRAM;  Surgeon: Loyde Rule, MD;  Location: [facility];  Service: Cardiovascular;  Laterality: N/A;   Patient Active Problem List   Diagnosis Date Noted   Malnutrition of moderate degree 06/19/2023   Staphylococcus aureus infection 06/19/2023   Bacteremia with signs of infection 06/18/2023   Discitis 06/15/2023   Right lateral epicondylitis 04/12/2023   Plantar fasciitis of left foot 08/17/2022   Ascending aorta dilatation -- found on CT Sep 2023 --> Recheck CT in Sep 2024 05/31/2022   Chest wall pain 04/20/2022   Traumatic hematoma of lower leg, left, initial encounter 11/03/2021   Basal cell carcinoma of back 07/22/2021   Dermatofibroma 07/22/2021   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   History of malignant neoplasm of skin 07/22/2021   Lentigo 07/22/2021   Melanocytic nevi of trunk 07/22/2021   Neoplasm of  uncertain behavior of skin 07/22/2021   Other skin changes due to chronic exposure to nonionizing radiation 07/22/2021   Skin tag 07/22/2021   Low back pain 08/03/2020   Nonallopathic lesion of sacral region 08/03/2020   Nonallopathic lesion of thoracic region 08/03/2020   Nonallopathic lesion of lumbar region 08/03/2020   Class 1 obesity due to excess calories without serious comorbidity with body mass index (BMI) of 33.0 to 33.9 in adult 11/17/2017   Hypercholesterolemia, not on statin 08/18/2017   H/O abnormal cervical Papanicolaou smear 01/06/2013    PCP: [name], PA   REFERRING PROVIDER: Isidro Margo, DO  REFERRING DIAG:M54.50,G89.29 (ICD-10-CM) - Chronic right-sided low back pain without sciatica  Rationale for Evaluation and Treatment: Rehabilitation  THERAPY DIAG:  Other low back pain  Muscle weakness (generalized)  ONSET DATE:about a month ago  SUBJECTIVE:  SUBJECTIVE STATEMENT: 01/29/2024  States about a month ago she was working in the yard. States she slid out of a booth and had a seizing up of her low back. States she was still having soreness afterwards. States she got out of habit of her exercises. States she is scared of going to the gym. Last few weeks she has been feeling better in her back. States she has gone back on her anti-inflammatory medication. Sitting for long periods of time and walking fatigues her lower back. Reports discomfort in her right knee after sitting for long periods of time.   PERTINENT HISTORY:  Migraines, GERD history of osteomyelitis L5/S1  PAIN:  Are you having pain? Yes: NPRS scale: 5/10 Pain location: back and hips Pain description: spasms, locked up  Aggravating factors: prolonged, static positions, rotational movements  Relieving  factors: Medication  PRECAUTIONS: Fall  RED FLAGS: None   WEIGHT BEARING RESTRICTIONS: No  FALLS:  Has patient fallen in last 6 months? 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  DIAGNOSTIC FINDINGS:  Lumbar MRI 08/30/2023   IMPRESSION: 1. Improving/resolving L5-S1 discitis osteomyelitis. Some residual marrow edema but no new or progressive inflammation there. No residual abscess. Mild residual clumping and architectural distortion of the lower cauda equina nerve roots. Erosion of the endplates and severe disc space loss since September combined for moderate bilateral L5 neural foraminal stenosis, mild right lateral recess stenosis, but no significant spinal stenosis there.   2. Other lumbar levels are stable.  Mild degeneration at L4-L5.        PATIENT SURVEYS:  Patient-specific activity functional scoring scheme (Point to one number):  "0" represents "unable to perform." "10" represents "able to perform at prior level. 0 1 2 3 4 5 6 7 8 9  10 (Date and Score) Activity Initial  Activity Eval     Sitting for long periods of time (couch)  6    Going to the gym  0    Walking for 30 minutes continuously  0    Additional Additional Total score = sum of the activity scores/number of activities Minimum detectable change (90%CI) for average score = 2 points Minimum detectable change (90%CI) for single activity score = 3 points PSFS developed by: [place] Spitz., & Binkley, J. (1995). Assessing disability and change on individual  patients:  a report of a patient specific measure. Physiotherapy [place], [phone], [phone]. Reproduced with the permission of the authors  Score: 6/3 = 2   SCREENING FOR RED FLAGS: Bowel or bladder incontinence: No Spinal tumors: No Cauda equina syndrome: No Compression fracture: No Abdominal aneurysm: No  COGNITION: Overall cognitive status: Within functional limits for tasks assessed     SENSATION: Not tested    POSTURE: Slumped to forward posture with heavy use hands on rolling walker  PALPATION: No tenderness to palpation noted in lumbar musculature or glutes  LUMBAR ROM: Unable to formally test secondary to high levels of pain  AROM 5/13  Flexion 25% limited  Extension 50% limited**  Right lateral flexion 25% limited*  Left lateral flexion 25%  limited*  Right rotation   Left rotation    (Blank rows = not tested)  * tightness  ** feels good    Lower Extremity Right 5/13 Left 5/13   A/PROM MMT A/PROM MMT  Hip Flexion WFL 5 WFL 5  Hip Extension 5 3^ 10 3  Hip Abduction      Hip Adduction      Hip Internal rotation 30^  45   Hip External rotation 60^  60   Knee Flexion WFL 4+ WFL 4+  Knee Extension WFL 5 WFL 5  Ankle Dorsiflexion  4+  4+  Ankle Plantarflexion      Ankle Inversion      Ankle Eversion       (Blank rows = not tested) *^pain/uncomfortable   LUMBAR SPECIAL TESTS:  Slump neg B Ely's Test neg B       TODAY'S TREATMENT:                                                                                                                              DATE:   01/29/2024  Therapeutic Exercise:  Aerobic: Supine: hip IR 5" holds x10 B, pelvic tilts anterior x10 and posterior x10 - tactile and verbal cues, bridges x10 5" holds  Prone:  Seated:  Standing: Neuromuscular Re-education: Manual Therapy: Therapeutic Activity: Self Care: Trigger Point Dry Needling:  Modalities:     PATIENT EDUCATION:  Education details: on current presentation, on HEP, on  clinical outcomes score and POC Person educated: Patient Education method: Programmer, multimedia, Demonstration, and Handouts Education comprehension: verbalized understanding    HOME EXERCISE PROGRAM: [id number]- previous HEP   ASSESSMENT:  CLINICAL IMPRESSION: EVAL  Patient presents to physical therapy with complaints of lower extremity and core weakness as well as back pain at times with functional movements and activities.  Patient presents with limitation in strength, posture and range of motion that is likely contributing to current presentation.  Patient is known to this clinic for prior treatment of low back pain after osteomyelitis of L5-S1 last year.  Patient continues to have inflammation in her spine that is likely contributing to weakness as well.  Patient would greatly benefit from skilled physical therapy to address physical impairments and return her to optimal function.  OBJECTIVE IMPAIRMENTS: Abnormal gait, decreased activity tolerance, decreased balance, decreased coordination, decreased endurance, decreased knowledge of use of DME, decreased mobility, difficulty walking, decreased ROM, decreased strength, improper body mechanics, postural dysfunction, and pain.   ACTIVITY LIMITATIONS: carrying, lifting, bending, sitting, standing, squatting, sleeping, stairs, transfers, bed mobility, bathing, toileting, dressing, hygiene/grooming, and locomotion level  PARTICIPATION LIMITATIONS: meal prep, cleaning, laundry, driving, shopping, community activity, and occupation  PERSONAL FACTORS: Age, Fitness, and 1 comorbidity: hx of Osteomyelitis L5-S1 are also affecting patient's functional outcome.   REHAB POTENTIAL: Good  CLINICAL DECISION MAKING: Stable/uncomplicated  EVALUATION COMPLEXITY: Low   GOALS: Goals reviewed with patient? yes  SHORT TERM GOALS: Target date: 03/11/2024  Patient will be independent in self management strategies to improve quality of life and functional  outcomes. Baseline: New Program Goal status: INITIAL  2.  Patient will report at least 50% improvement in overall symptoms and/or function to demonstrate improved functional mobility Baseline: 0% better Goal status: INITIAL  3.  Straight at least 3+ out of 5 MMT strength in bilateral lower extremities in hip extension Baseline: Not able Goal status: INITIAL     LONG TERM GOALS: Target date: 04/22/2024    Patient will report at least 75% improvement in overall symptoms and/or function to demonstrate improved functional mobility Baseline: 0% better Goal status: INITIAL  2.  Patient will score at least 2 points higher on PSFS average to demonstrate change in overall function. Baseline: see above Goal status: INITIAL  3.  Patient will attend gym regularly without fear of injury to her low back to improve ability to continue to work on strength and mobility after physical therapy Baseline: Fearful unable Goal status: INITIAL     PLAN:  PT FREQUENCY:1- 2x/week for a total of 16 visits over 12 week certification period  PT DURATION: 12 weeks  PLANNED INTERVENTIONS: 97110-Therapeutic exercises, 97530- Therapeutic activity, 97112- Neuromuscular re-education, 97535- Self Care, 21308- Manual therapy, [phone]- Gait training, [phone]- Orthotic Fit/training, [phone]- Canalith repositioning, [id number]- Aquatic Therapy, 97014- Electrical stimulation (unattended), [phone]- Ionotophoresis 4mg /ml Dexamethasone, Patient/Family education, Balance training, Stair training, Taping, Dry Needling, Joint mobilization, Joint manipulation, Spinal manipulation, Spinal mobilization, Cryotherapy, and Moist heat    PLAN FOR NEXT SESSION:hip add/IR strengthening, hip extension MMT, hip flexors stretches  4:54 PM, 01/29/24 Tabitha Ewings, DPT Physical Therapy with [facility]

## 2024-02-06 ENCOUNTER — Encounter: Payer: Self-pay | Admitting: Physical Therapy

## 2024-02-06 ENCOUNTER — Ambulatory Visit (INDEPENDENT_AMBULATORY_CARE_PROVIDER_SITE_OTHER): Admitting: Physical Therapy

## 2024-02-06 DIAGNOSIS — M6281 Muscle weakness (generalized): Secondary | ICD-10-CM | POA: Diagnosis not present

## 2024-02-06 DIAGNOSIS — M5459 Other low back pain: Secondary | ICD-10-CM | POA: Diagnosis not present

## 2024-02-06 DIAGNOSIS — R262 Difficulty in walking, not elsewhere classified: Secondary | ICD-10-CM | POA: Diagnosis not present

## 2024-02-06 NOTE — Therapy (Signed)
 OUTPATIENT PHYSICAL THERAPY THORACOLUMBAR TREATMENT      Patient Name: Susan [name] MRN: [id number] DOB:09/02/1976, 48 y.o., female Today's Date: 02/06/2024  END OF SESSION:  PT End of Session - 02/06/24 1215     Visit Number 2    Number of Visits 16    Date for PT Re-Evaluation 04/22/24    PT Start Time 1215    PT Stop Time 1255    PT Time Calculation (min) 40 min    Activity Tolerance Patient tolerated treatment well                  Past Medical History:  Diagnosis Date   Abnormal pap 02/2003   CIN 2-3   Basal cell carcinoma of back 07/22/2021   Elevated cholesterol    Gallstones    GERD (gastroesophageal reflux disease)    Hyperlipidemia    Migraine with aura    Past Surgical History:  Procedure Laterality Date   BREAST BIOPSY Left 08/04/2022   US  LT BREAST BX W LOC DEV 1ST LESION IMG BX SPEC US  GUIDE 08/04/2022 [facility]   CERVICAL BIOPSY  W/ LOOP ELECTRODE EXCISION  02/2003   CIN 2-3   CHOLECYSTECTOMY  2015   COSMETIC SURGERY  2009   TEE WITHOUT CARDIOVERSION N/A 06/18/2023   Procedure: TRANSESOPHAGEAL ECHOCARDIOGRAM;  Surgeon: Loyde Rule, MD;  Location: [facility];  Service: Cardiovascular;  Laterality: N/A;   Patient Active Problem List   Diagnosis Date Noted   Malnutrition of moderate degree 06/19/2023   Staphylococcus aureus infection 06/19/2023   Bacteremia with signs of infection 06/18/2023   Discitis 06/15/2023   Right lateral epicondylitis 04/12/2023   Plantar fasciitis of left foot 08/17/2022   Ascending aorta dilatation -- found on CT Sep 2023 --> Recheck CT in Sep 2024 05/31/2022   Chest wall pain 04/20/2022   Traumatic hematoma of lower leg, left, initial encounter 11/03/2021   Basal cell carcinoma of back 07/22/2021   Dermatofibroma 07/22/2021   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   History of malignant neoplasm of skin 07/22/2021   Lentigo 07/22/2021   Melanocytic nevi of trunk 07/22/2021   Neoplasm of  uncertain behavior of skin 07/22/2021   Other skin changes due to chronic exposure to nonionizing radiation 07/22/2021   Skin tag 07/22/2021   Low back pain 08/03/2020   Nonallopathic lesion of sacral region 08/03/2020   Nonallopathic lesion of thoracic region 08/03/2020   Nonallopathic lesion of lumbar region 08/03/2020   Class 1 obesity due to excess calories without serious comorbidity with body mass index (BMI) of 33.0 to 33.9 in adult 11/17/2017   Hypercholesterolemia, not on statin 08/18/2017   H/O abnormal cervical Papanicolaou smear 01/06/2013    PCP: [name], PA   REFERRING PROVIDER: Isidro Margo, DO  REFERRING DIAG:M54.50,G89.29 (ICD-10-CM) - Chronic right-sided low back pain without sciatica  Rationale for Evaluation and Treatment: Rehabilitation  THERAPY DIAG:  Other low back pain  Muscle weakness (generalized)  Difficulty in walking, not elsewhere classified  ONSET DATE:about a month ago  SUBJECTIVE:  SUBJECTIVE STATEMENT: 02/06/2024 Mild soreness no complaints. Trying to be aware of posture and walking position.   EVAL:  States about a month ago she was working in the yard. States she slid out of a booth and had a seizing up of her low back. States she was still having soreness afterwards. States she got out of habit of her exercises. States she is scared of going to the gym. Last few weeks she has been feeling better in her back. States she has gone back on her anti-inflammatory medication. Sitting for long periods of time and walking fatigues her lower back. Reports discomfort in her right knee after sitting for long periods of time.   PERTINENT HISTORY:  Migraines, GERD history of osteomyelitis L5/S1  PAIN:  Are you having pain? Yes: NPRS scale: 3/10 Pain location:  back and hips Pain description: spasms, locked up  Aggravating factors: prolonged, static positions, rotational movements  Relieving factors: Medication  PRECAUTIONS: Fall  RED FLAGS: None   WEIGHT BEARING RESTRICTIONS: No  FALLS:  Has patient fallen in last 6 months? 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  DIAGNOSTIC FINDINGS:  Lumbar MRI 08/30/2023   IMPRESSION: 1. Improving/resolving L5-S1 discitis osteomyelitis. Some residual marrow edema but no new or progressive inflammation there. No residual abscess. Mild residual clumping and architectural distortion of the lower cauda equina nerve roots. Erosion of the endplates and severe disc space loss since September combined for moderate bilateral L5 neural foraminal stenosis, mild right lateral recess stenosis, but no significant spinal stenosis there.   2. Other lumbar levels are stable.  Mild degeneration at L4-L5.        PATIENT SURVEYS:  Patient-specific activity functional scoring scheme (Point to one number):  "0" represents "unable to perform." "10" represents "able to perform at prior level. 0 1 2 3 4 5 6 7 8 9  10 (Date and Score) Activity Initial  Activity Eval     Sitting for long periods of time (couch)  6    Going to the gym  0    Walking for 30 minutes continuously  0    Additional Additional Total score = sum of the activity scores/number of activities Minimum detectable change (90%CI) for average score = 2 points Minimum detectable change (90%CI) for single activity score = 3  points PSFS developed by: [place] Spitz., & Binkley, J. (1995). Assessing disability and change on individual  patients: a report of a patient specific measure. Physiotherapy [place], 47, [phone]. Reproduced with the permission of the authors  Score: 6/3 = 2   SCREENING FOR RED FLAGS: Bowel or bladder incontinence: No Spinal tumors: No Cauda equina syndrome: No Compression fracture: No Abdominal aneurysm: No  COGNITION: Overall cognitive status: Within functional limits for tasks assessed     SENSATION: Not tested    POSTURE: Slumped to forward posture with heavy use hands on rolling walker  PALPATION: No tenderness to palpation noted in lumbar musculature or glutes  LUMBAR ROM: Unable to formally test secondary to high levels of pain  AROM 5/13  Flexion 25%  limited  Extension 50% limited**  Right lateral flexion 25% limited*  Left lateral flexion 25% limited*  Right rotation   Left rotation    (Blank rows = not tested)  * tightness  ** feels good    Lower Extremity Right 5/13 Left 5/13   A/PROM MMT A/PROM MMT  Hip Flexion WFL 5 WFL 5  Hip Extension 5 3^ 10 3  Hip Abduction      Hip Adduction      Hip Internal rotation 30^  45   Hip External rotation 60^  60   Knee Flexion WFL 4+ WFL 4+  Knee Extension WFL 5 WFL 5  Ankle Dorsiflexion  4+  4+  Ankle Plantarflexion      Ankle Inversion      Ankle Eversion       (Blank rows = not tested) *^pain/uncomfortable   LUMBAR SPECIAL TESTS:  Slump neg B Ely's Test neg B       TODAY'S TREATMENT:                                                                                                                              DATE:   02/06/2024  Therapeutic Exercise:  Aerobic: Supine: hip IR 5" holds 3 minutes B, pelvic tilts - pain initially --> lumbar extension stretch over towel 4 minutes - pelvic tilts tolerated afterwards 2 minutes, towel roll in pelvic region 2 minutes, thomas stretch  x3 30" holds B, SLR slow and controlled 3x10 B S/l: clams slow and controlled 3x10 B Prone:  Seated:  Standing: Neuromuscular Re-education: Manual Therapy: Therapeutic Activity: Self Care: Trigger Point Dry Needling:  Modalities:     PATIENT EDUCATION:  Education details: on HEP Person educated: Patient Education method: Explanation, Facilities manager, and Handouts Education comprehension: verbalized understanding    HOME EXERCISE PROGRAM: [id number]- previous HEP   ASSESSMENT:  CLINICAL IMPRESSION: 02/06/2024 Progressed exercises as able.. cued patient for more controlled movements as she tends to over rotate her lumbar spine during LE exercises. Improved with concentration and cues. Slight pain noted with some movements but this improved with cues to breath and to reduce muscle guarding. Overall tolerated session well. Will continue with current POC as tolerated.    EVAL: Patient presents to physical therapy with complaints of lower extremity and core weakness as well as back pain at times with functional movements and activities.  Patient presents with limitation in strength, posture and range of motion that is likely contributing to current presentation.  Patient is known to this clinic for prior treatment of low back pain after osteomyelitis of L5-S1 last year.  Patient continues to have inflammation in her spine that is likely contributing to weakness as well.  Patient would greatly benefit from skilled physical therapy to address physical impairments and return her to optimal function.  OBJECTIVE IMPAIRMENTS: Abnormal gait, decreased activity tolerance, decreased balance, decreased coordination, decreased endurance, decreased knowledge of use of DME, decreased mobility, difficulty  walking, decreased ROM, decreased strength, improper body mechanics, postural dysfunction, and pain.   ACTIVITY LIMITATIONS: carrying, lifting, bending, sitting, standing, squatting, sleeping, stairs,  transfers, bed mobility, bathing, toileting, dressing, hygiene/grooming, and locomotion level  PARTICIPATION LIMITATIONS: meal prep, cleaning, laundry, driving, shopping, community activity, and occupation  PERSONAL FACTORS: Age, Fitness, and 1 comorbidity: hx of Osteomyelitis L5-S1 are also affecting patient's functional outcome.   REHAB POTENTIAL: Good  CLINICAL DECISION MAKING: Stable/uncomplicated  EVALUATION COMPLEXITY: Low   GOALS: Goals reviewed with patient? yes  SHORT TERM GOALS: Target date: 03/11/2024   Patient will be independent in self management strategies to improve quality of life and functional outcomes. Baseline: New Program Goal status: INITIAL  2.  Patient will report at least 50% improvement in overall symptoms and/or function to demonstrate improved functional mobility Baseline: 0% better Goal status: INITIAL  3.  Straight at least 3+ out of 5 MMT strength in bilateral lower extremities in hip extension Baseline: Not able Goal status: INITIAL     LONG TERM GOALS: Target date: 04/22/2024    Patient will report at least 75% improvement in overall symptoms and/or function to demonstrate improved functional mobility Baseline: 0% better Goal status: INITIAL  2.  Patient will score at least 2 points higher on PSFS average to demonstrate change in overall function. Baseline: see above Goal status: INITIAL  3.  Patient will attend gym regularly without fear of injury to her low back to improve ability to continue to work on strength and mobility after physical therapy Baseline: Fearful unable Goal status: INITIAL     PLAN:  PT FREQUENCY:1- 2x/week for a total of 16 visits over 12 week certification period  PT DURATION: 12 weeks  PLANNED INTERVENTIONS: 97110-Therapeutic exercises, 97530- Therapeutic activity, 97112- Neuromuscular re-education, 97535- Self Care, 16109- Manual therapy, [phone]- Gait training, [phone]- Orthotic Fit/training, [phone]- Canalith  repositioning, [id number]- Aquatic Therapy, 97014- Electrical stimulation (unattended), [phone]- Ionotophoresis 4mg /ml Dexamethasone, Patient/Family education, Balance training, Stair training, Taping, Dry Needling, Joint mobilization, Joint manipulation, Spinal manipulation, Spinal mobilization, Cryotherapy, and Moist heat    PLAN FOR NEXT SESSION:hip add/IR strengthening, hip extension MMT, hip flexors stretches  12:57 PM, 02/06/24 Tabitha Ewings, DPT Physical Therapy with [place]

## 2024-02-12 ENCOUNTER — Ambulatory Visit (INDEPENDENT_AMBULATORY_CARE_PROVIDER_SITE_OTHER): Admitting: Physical Therapy

## 2024-02-12 ENCOUNTER — Encounter: Payer: Self-pay | Admitting: Physical Therapy

## 2024-02-12 DIAGNOSIS — M6281 Muscle weakness (generalized): Secondary | ICD-10-CM

## 2024-02-12 DIAGNOSIS — M5459 Other low back pain: Secondary | ICD-10-CM | POA: Diagnosis not present

## 2024-02-12 DIAGNOSIS — R262 Difficulty in walking, not elsewhere classified: Secondary | ICD-10-CM

## 2024-02-12 NOTE — Therapy (Signed)
 OUTPATIENT PHYSICAL THERAPY THORACOLUMBAR TREATMENT      Patient Name: Susan [name] MRN: [id number] DOB:10-13-1975, 48 y.o., female Today's Date: 02/12/2024  END OF SESSION:  PT End of Session - 02/12/24 0846     Visit Number 3    Number of Visits 16    Date for PT Re-Evaluation 04/22/24    PT Start Time 0848    PT Stop Time 0926    PT Time Calculation (min) 38 min    Activity Tolerance Patient tolerated treatment well                  Past Medical History:  Diagnosis Date   Abnormal pap 02/2003   CIN 2-3   Basal cell carcinoma of back 07/22/2021   Elevated cholesterol    Gallstones    GERD (gastroesophageal reflux disease)    Hyperlipidemia    Migraine with aura    Past Surgical History:  Procedure Laterality Date   BREAST BIOPSY Left 08/04/2022   US  LT BREAST BX W LOC DEV 1ST LESION IMG BX SPEC US  GUIDE 08/04/2022 [facility]   CERVICAL BIOPSY  W/ LOOP ELECTRODE EXCISION  02/2003   CIN 2-3   CHOLECYSTECTOMY  2015   COSMETIC SURGERY  2009   TEE WITHOUT CARDIOVERSION N/A 06/18/2023   Procedure: TRANSESOPHAGEAL ECHOCARDIOGRAM;  Surgeon: Loyde Rule, MD;  Location: [facility];  Service: Cardiovascular;  Laterality: N/A;   Patient Active Problem List   Diagnosis Date Noted   Malnutrition of moderate degree 06/19/2023   Staphylococcus aureus infection 06/19/2023   Bacteremia with signs of infection 06/18/2023   Discitis 06/15/2023   Right lateral epicondylitis 04/12/2023   Plantar fasciitis of left foot 08/17/2022   Ascending aorta dilatation -- found on CT Sep 2023 --> Recheck CT in Sep 2024 05/31/2022   Chest wall pain 04/20/2022   Traumatic hematoma of lower leg, left, initial encounter 11/03/2021   Basal cell carcinoma of back 07/22/2021   Dermatofibroma 07/22/2021   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   History of malignant neoplasm of skin 07/22/2021   Lentigo 07/22/2021   Melanocytic nevi of trunk 07/22/2021   Neoplasm of  uncertain behavior of skin 07/22/2021   Other skin changes due to chronic exposure to nonionizing radiation 07/22/2021   Skin tag 07/22/2021   Low back pain 08/03/2020   Nonallopathic lesion of sacral region 08/03/2020   Nonallopathic lesion of thoracic region 08/03/2020   Nonallopathic lesion of lumbar region 08/03/2020   Class 1 obesity due to excess calories without serious comorbidity with body mass index (BMI) of 33.0 to 33.9 in adult 11/17/2017   Hypercholesterolemia, not on statin 08/18/2017   H/O abnormal cervical Papanicolaou smear 01/06/2013    PCP: [name], PA   REFERRING PROVIDER: Isidro Margo, DO  REFERRING DIAG:M54.50,G89.29 (ICD-10-CM) - Chronic right-sided low back pain without sciatica  Rationale for Evaluation and Treatment: Rehabilitation  THERAPY DIAG:  Other low back pain  Muscle weakness (generalized)  Difficulty in walking, not elsewhere classified  ONSET DATE:about a month ago  SUBJECTIVE:  SUBJECTIVE STATEMENT: 02/12/2024 States she feels the rain with a little soreness in her groin. States she had some soreness Friday.   EVAL:  States about a month ago she was working in the yard. States she slid out of a booth and had a seizing up of her low back. States she was still having soreness afterwards. States she got out of habit of her exercises. States she is scared of going to the gym. Last few weeks she has been feeling better in her back. States she has gone back on her anti-inflammatory medication. Sitting for long periods of time and walking fatigues her lower back. Reports discomfort in her right knee after sitting for long periods of time.   PERTINENT HISTORY:  Migraines, GERD history of osteomyelitis L5/S1  PAIN:  Are you having pain? Yes: NPRS scale:  2/10 Pain location: groin Pain description: spasms, locked up  Aggravating factors: prolonged, static positions, rotational movements  Relieving factors: Medication  PRECAUTIONS: Fall  RED FLAGS: None   WEIGHT BEARING RESTRICTIONS: No  FALLS:  Has patient fallen in last 6 months? 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  DIAGNOSTIC FINDINGS:  Lumbar MRI 08/30/2023   IMPRESSION: 1. Improving/resolving L5-S1 discitis osteomyelitis. Some residual marrow edema but no new or progressive inflammation there. No residual abscess. Mild residual clumping and architectural distortion of the lower cauda equina nerve roots. Erosion of the endplates and severe disc space loss since September combined for moderate bilateral L5 neural foraminal stenosis, mild right lateral recess stenosis, but no significant spinal stenosis there.   2. Other lumbar levels are stable.  Mild degeneration at L4-L5.        PATIENT SURVEYS:  Patient-specific activity functional scoring scheme (Point to one number):  "0" represents "unable to perform." "10" represents "able to perform at prior level. 0 1 2 3 4 5 6 7 8 9  10 (Date and Score) Activity Initial  Activity Eval     Sitting for long periods of time (couch)  6    Going to the gym  0    Walking for 30 minutes continuously  0    Additional Additional Total score = sum of the activity scores/number of activities Minimum detectable change (90%CI) for average score = 2 points Minimum detectable change (90%CI) for single activity score  = 3 points PSFS developed by: [place] Spitz., & Binkley, J. (1995). Assessing disability and change on individual  patients: a report of a patient specific measure. Physiotherapy [place], 47, [phone]. Reproduced with the permission of the authors  Score: 6/3 = 2   SCREENING FOR RED FLAGS: Bowel or bladder incontinence: No Spinal tumors: No Cauda equina syndrome: No Compression fracture: No Abdominal aneurysm: No  COGNITION: Overall cognitive status: Within functional limits for tasks assessed     SENSATION: Not tested    POSTURE: Slumped to forward posture with heavy use hands on rolling walker  PALPATION: No tenderness to palpation noted in lumbar musculature or glutes  LUMBAR ROM: Unable to formally test secondary to high levels of pain  AROM 5/13  Flexion 25% limited  Extension 50% limited**  Right lateral flexion 25% limited*  Left lateral flexion 25% limited*  Right rotation   Left rotation    (Blank rows = not tested)  * tightness  ** feels good    Lower Extremity Right 5/13 Left 5/13   A/PROM MMT A/PROM MMT  Hip Flexion WFL 5 WFL 5  Hip Extension 5 3^ 10 3  Hip Abduction      Hip Adduction      Hip Internal rotation 30^  45   Hip External rotation 60^  60   Knee Flexion WFL 4+ WFL 4+  Knee Extension WFL 5 WFL 5  Ankle Dorsiflexion  4+  4+  Ankle Plantarflexion      Ankle Inversion      Ankle Eversion       (Blank rows = not tested) *^pain/uncomfortable   LUMBAR SPECIAL TESTS:  Slump neg B Ely's Test neg B       TODAY'S TREATMENT:                                                                                                                              DATE:   02/12/2024  Therapeutic Exercise: Reviewed entire HEP Supine: bent knee fall outs 2 minutes, LTR 2 minutes, hip IR 3 minutes, piriformis stretch x3 30" holds B, bridges with post tilt x10  S/l:   Prone:  Seated:  lumbar flexion with L/R/C x5 5" holds  Each position   Standing: crossover step ups 4" with 6# weight 3x5 B, wall stretch reaching up wall x5 10" holds  Neuromuscular Re-education: Manual Therapy: Therapeutic Activity: STS slow and controlled 2x10,  step up (4 in) with contralateral knee drive) 6# weight 3x5 (to help carry objects up stairs) Self Care: Trigger Point Dry Needling:  Modalities: thermotherapy to low back during supine interventions     PATIENT EDUCATION:  Education details: on HEP Person educated: Patient Education method: Explanation, Facilities manager, and Handouts Education comprehension: verbalized understanding    HOME EXERCISE PROGRAM: [id number]- previous HEP   ASSESSMENT:  CLINICAL IMPRESSION: 02/12/2024 Initiated session with thermotherapy and gentle stretches which were tolerated well. Progressed strengthening exercises and added light weight. Tolerated well but fatigue noted. Ended with additional stretches which was tolerated well. No pain noted during or after session. Will continue with current POC as tolerated .    EVAL: Patient presents to physical therapy with complaints of lower extremity and core weakness as well as back pain at times with functional movements and activities.  Patient presents with limitation in strength, posture and range of motion that is likely contributing to current presentation.  Patient is known to this clinic for prior treatment of low back pain after osteomyelitis of L5-S1 last year.  Patient continues to have inflammation in her spine that is likely contributing to weakness as well.  Patient would greatly benefit from skilled physical therapy to address physical impairments and return  her to optimal function.  OBJECTIVE IMPAIRMENTS: Abnormal gait, decreased activity tolerance, decreased balance, decreased coordination, decreased endurance, decreased knowledge of use of DME, decreased mobility, difficulty walking, decreased ROM, decreased strength, improper body mechanics,  postural dysfunction, and pain.   ACTIVITY LIMITATIONS: carrying, lifting, bending, sitting, standing, squatting, sleeping, stairs, transfers, bed mobility, bathing, toileting, dressing, hygiene/grooming, and locomotion level  PARTICIPATION LIMITATIONS: meal prep, cleaning, laundry, driving, shopping, community activity, and occupation  PERSONAL FACTORS: Age, Fitness, and 1 comorbidity: hx of Osteomyelitis L5-S1 are also affecting patient's functional outcome.   REHAB POTENTIAL: Good  CLINICAL DECISION MAKING: Stable/uncomplicated  EVALUATION COMPLEXITY: Low   GOALS: Goals reviewed with patient? yes  SHORT TERM GOALS: Target date: 03/11/2024   Patient will be independent in self management strategies to improve quality of life and functional outcomes. Baseline: New Program Goal status: INITIAL  2.  Patient will report at least 50% improvement in overall symptoms and/or function to demonstrate improved functional mobility Baseline: 0% better Goal status: INITIAL  3.  Straight at least 3+ out of 5 MMT strength in bilateral lower extremities in hip extension Baseline: Not able Goal status: INITIAL     LONG TERM GOALS: Target date: 04/22/2024    Patient will report at least 75% improvement in overall symptoms and/or function to demonstrate improved functional mobility Baseline: 0% better Goal status: INITIAL  2.  Patient will score at least 2 points higher on PSFS average to demonstrate change in overall function. Baseline: see above Goal status: INITIAL  3.  Patient will attend gym regularly without fear of injury to her low back to improve ability to continue to work on strength and mobility after physical therapy Baseline: Fearful unable Goal status: INITIAL     PLAN:  PT FREQUENCY:1- 2x/week for a total of 16 visits over 12 week certification period  PT DURATION: 12 weeks  PLANNED INTERVENTIONS: 97110-Therapeutic exercises, 97530- Therapeutic activity, 97112-  Neuromuscular re-education, 97535- Self Care, 88416- Manual therapy, [phone]- Gait training, [phone]- Orthotic Fit/training, [phone]- Canalith repositioning, [id number]- Aquatic Therapy, 97014- Electrical stimulation (unattended), [phone]- Ionotophoresis 4mg /ml Dexamethasone, Patient/Family education, Balance training, Stair training, Taping, Dry Needling, Joint mobilization, Joint manipulation, Spinal manipulation, Spinal mobilization, Cryotherapy, and Moist heat    PLAN FOR NEXT SESSION:hip add/IR strengthening, hip extension MMT, hip flexors stretches  9:26 AM, 02/12/24 Tabitha Ewings, DPT Physical Therapy with [place]

## 2024-02-19 ENCOUNTER — Encounter: Admitting: Physical Therapy

## 2024-02-21 ENCOUNTER — Encounter: Admitting: Physical Therapy

## 2024-02-26 ENCOUNTER — Encounter: Admitting: Physical Therapy

## 2024-02-27 NOTE — Progress Notes (Signed)
 [name] [address] [place] [address] Phone: [phone] Subjective:   Susan [name], am serving as a scribe for Dr. Ronnell Coins.  I'm seeing this patient by the request  of:  [name], [name]  CC: Back and neck pain follow-up  BJY:[id number]  Susan [name] is a 48 y.o. female coming in with complaint of back and neck pain. OMT 01/10/2024. Patient states doing well. No new symptoms.  Medications patient has been prescribed: None  Taking:     Reviewing patient's chart continues to be in physical therapy.    Reviewed prior external information including notes and imaging from previsou exam, outside providers and external EMR if available.   As well as notes that were available from care everywhere and other healthcare systems.  Past medical history, social, surgical and family history all reviewed in electronic medical record.  No pertanent information unless stated regarding to the chief complaint.   Past Medical History:  Diagnosis Date   Abnormal pap 02/2003   CIN 2-3   Basal cell carcinoma of back 07/22/2021   Elevated cholesterol    Gallstones    GERD (gastroesophageal reflux disease)    Hyperlipidemia    Migraine with aura     No Known Allergies   Review of Systems:  No headache, visual changes, nausea, vomiting, diarrhea, constipation, dizziness, abdominal pain, skin rash, fevers, chills, night sweats, weight loss, swollen lymph nodes, body aches, joint swelling, chest pain, shortness of breath, mood changes. POSITIVE muscle aches  Objective  Blood pressure 126/84, pulse 74, height 5' 8 (1.727 m), weight 217 lb (98.4 kg), SpO2 98%.   General: No apparent distress alert and oriented x3 mood and affect normal, dressed appropriately.  HEENT: Pupils equal, extraocular movements intact  Respiratory: Patient's speak in full sentences and does not appear short of breath  Cardiovascular: No lower extremity  edema, non tender, no erythema  Gait MSK:  Back he does have some loss lordosis noted.  Some tenderness to palpation in the paraspinal musculature.  Lumbar area.  Negative straight leg test.  Osteopathic findings  C3 flexed rotated and side bent right C6 flexed rotated and side bent left T3 extended rotated and side bent right inhaled rib T9 extended rotated and side bent left L2 flexed rotated and side bent right Sacrum right on right       Assessment and Plan:  Low back pain Continues have some chronic pain and is still getting better from the osteomyelitis.  Patient does need to continue to work on core strengthening and increasing activity.  Discussed with patient about icing regimen and home exercises.  Increase activity slowly.  Follow-up again 6 to 8 weeks.    Nonallopathic problems  Decision today to treat with OMT was based on Physical Exam  After verbal consent patient was treated with  ME, FPR techniques in cervical, rib, thoracic, lumbar, and sacral  areas still avoiding HVLA at this time.  Patient tolerated the procedure well with improvement in symptoms  Patient given exercises, stretches and lifestyle modifications  See medications in patient instructions if given  Patient will follow up in 4-8 weeks    The above documentation has been reviewed and is accurate and complete Umer Harig M Advait Buice, DO          Note: This dictation was prepared with Dragon dictation along with smaller phrase technology. Any transcriptional errors that result from this process are unintentional.

## 2024-02-28 ENCOUNTER — Ambulatory Visit: Admitting: Family Medicine

## 2024-02-28 ENCOUNTER — Ambulatory Visit: Admitting: Physical Therapy

## 2024-02-28 ENCOUNTER — Encounter: Payer: Self-pay | Admitting: Family Medicine

## 2024-02-28 ENCOUNTER — Encounter: Payer: Self-pay | Admitting: Physical Therapy

## 2024-02-28 VITALS — BP 126/84 | HR 74 | Ht 68.0 in | Wt 217.0 lb

## 2024-02-28 DIAGNOSIS — M9903 Segmental and somatic dysfunction of lumbar region: Secondary | ICD-10-CM

## 2024-02-28 DIAGNOSIS — M9902 Segmental and somatic dysfunction of thoracic region: Secondary | ICD-10-CM

## 2024-02-28 DIAGNOSIS — M5459 Other low back pain: Secondary | ICD-10-CM | POA: Diagnosis not present

## 2024-02-28 DIAGNOSIS — M9908 Segmental and somatic dysfunction of rib cage: Secondary | ICD-10-CM

## 2024-02-28 DIAGNOSIS — M9901 Segmental and somatic dysfunction of cervical region: Secondary | ICD-10-CM

## 2024-02-28 DIAGNOSIS — M545 Low back pain, unspecified: Secondary | ICD-10-CM | POA: Diagnosis not present

## 2024-02-28 DIAGNOSIS — R262 Difficulty in walking, not elsewhere classified: Secondary | ICD-10-CM

## 2024-02-28 DIAGNOSIS — M6281 Muscle weakness (generalized): Secondary | ICD-10-CM

## 2024-02-28 DIAGNOSIS — G8929 Other chronic pain: Secondary | ICD-10-CM

## 2024-02-28 DIAGNOSIS — M9904 Segmental and somatic dysfunction of sacral region: Secondary | ICD-10-CM | POA: Diagnosis not present

## 2024-02-28 NOTE — Assessment & Plan Note (Signed)
 Continues have some chronic pain and is still getting better from the osteomyelitis.  Patient does need to continue to work on core strengthening and increasing activity.  Discussed with patient about icing regimen and home exercises.  Increase activity slowly.  Follow-up again 6 to 8 weeks.

## 2024-02-28 NOTE — Therapy (Signed)
 OUTPATIENT PHYSICAL THERAPY THORACOLUMBAR TREATMENT      Patient Name: Susan [name] MRN: [id number] DOB:[birth year], 48 y.o., female Today's Date: 02/28/2024  END OF SESSION:  PT End of Session - 02/28/24 1215     Visit Number 4    Number of Visits 16    Date for PT Re-Evaluation 04/22/24    PT Start Time 1217    PT Stop Time 1255    PT Time Calculation (min) 38 min    Activity Tolerance Patient tolerated treatment well               Past Medical History:  Diagnosis Date   Abnormal pap 02/2003   CIN 2-3   Basal cell carcinoma of back 07/22/2021   Elevated cholesterol    Gallstones    GERD (gastroesophageal reflux disease)    Hyperlipidemia    Migraine with aura    Past Surgical History:  Procedure Laterality Date   BREAST BIOPSY Left 08/04/2022   US  LT BREAST BX W LOC DEV 1ST LESION IMG BX SPEC US  GUIDE 08/04/2022 [facility]   CERVICAL BIOPSY  W/ LOOP ELECTRODE EXCISION  02/2003   CIN 2-3   CHOLECYSTECTOMY  2015   COSMETIC SURGERY  2009   TEE WITHOUT CARDIOVERSION N/A 06/18/2023   Procedure: TRANSESOPHAGEAL ECHOCARDIOGRAM;  Surgeon: Loyde Rule, MD;  Location: [facility];  Service: Cardiovascular;  Laterality: N/A;   Patient Active Problem List   Diagnosis Date Noted   Malnutrition of moderate degree 06/19/2023   Staphylococcus aureus infection 06/19/2023   Bacteremia with signs of infection 06/18/2023   Discitis 06/15/2023   Right lateral epicondylitis 04/12/2023   Plantar fasciitis of left foot 08/17/2022   Ascending aorta dilatation -- found on CT Sep 2023 --> Recheck CT in Sep 2024 05/31/2022   Chest wall pain 04/20/2022   Traumatic hematoma of lower leg, left, initial encounter 11/03/2021   Basal cell carcinoma of back 07/22/2021   Dermatofibroma 07/22/2021   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   History of malignant neoplasm of skin 07/22/2021   Lentigo 07/22/2021   Melanocytic nevi of trunk 07/22/2021   Neoplasm of  uncertain behavior of skin 07/22/2021   Other skin changes due to chronic exposure to nonionizing radiation 07/22/2021   Skin tag 07/22/2021   Low back pain 08/03/2020   Nonallopathic lesion of sacral region 08/03/2020   Nonallopathic lesion of thoracic region 08/03/2020   Nonallopathic lesion of lumbar region 08/03/2020   Class 1 obesity due to excess calories without serious comorbidity with body mass index (BMI) of 33.0 to 33.9 in adult 11/17/2017   Hypercholesterolemia, not on statin 08/18/2017   H/O abnormal cervical Papanicolaou smear 01/06/2013    PCP: [name], PA   REFERRING PROVIDER: Isidro Margo, DO  REFERRING DIAG:M54.50,G89.29 (ICD-10-CM) - Chronic right-sided low back pain without sciatica  Rationale for Evaluation and Treatment: Rehabilitation  THERAPY DIAG:  Other low back pain  Muscle weakness (generalized)  Difficulty in walking, not elsewhere classified  ONSET DATE:about a month ago  SUBJECTIVE:  SUBJECTIVE STATEMENT: 02/28/2024 States she went out of town and didn't do exercises was in more pain with the drive, started up again and feeling better. Just coming from [place] with recent adjustment no complaints.   EVAL:  States about a month ago she was working in the yard. States she slid out of a booth and had a seizing up of her low back. States she was still having soreness afterwards. States she got out of habit of her exercises. States she is scared of going to the gym. Last few weeks she has been feeling better in her back. States she has gone back on her anti-inflammatory medication. Sitting for long periods of time and walking fatigues her lower back. Reports discomfort in her right knee after sitting for long periods of time.   PERTINENT HISTORY:  Migraines, GERD  history of osteomyelitis L5/S1  PAIN:  Are you having pain? Yes: NPRS scale: 2/10 Pain location: groin Pain description: spasms, locked up  Aggravating factors: prolonged, static positions, rotational movements  Relieving factors: Medication  PRECAUTIONS: Fall  RED FLAGS: None   WEIGHT BEARING RESTRICTIONS: No  FALLS:  Has patient fallen in last 6 months? 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  DIAGNOSTIC FINDINGS:  Lumbar MRI 08/30/2023   IMPRESSION: 1. Improving/resolving L5-S1 discitis osteomyelitis. Some residual marrow edema but no new or progressive inflammation there. No residual abscess. Mild residual clumping and architectural distortion of the lower cauda equina nerve roots. Erosion of the endplates and severe disc space loss since September combined for moderate bilateral L5 neural foraminal stenosis, mild right lateral recess stenosis, but no significant spinal stenosis there.   2. Other lumbar levels are stable.  Mild degeneration at L4-L5.        PATIENT SURVEYS:  Patient-specific activity functional scoring scheme (Point to one number):  0 represents "unable to perform." 10 represents "able to perform at prior level. 0 1 2 3 4 5 6 7 8 9  10 (Date and Score) Activity Initial  Activity Eval     Sitting for long periods of time (couch)  6    Going to the gym  0    Walking for 30 minutes continuously  0    Additional Additional Total score = sum of the activity scores/number of activities Minimum detectable change (90%CI) for  average score = 2 points Minimum detectable change (90%CI) for single activity score = 3 points PSFS developed by: [place] Spitz., & Binkley, J. (1995). Assessing disability and change on individual  patients: a report of a patient specific measure. Physiotherapy [place], [phone], [phone]. Reproduced with the permission of the authors  Score: 6/3 = 2   SCREENING FOR RED FLAGS: Bowel or bladder incontinence: No Spinal tumors: No Cauda equina syndrome: No Compression fracture: No Abdominal aneurysm: No  COGNITION: Overall cognitive status: Within functional limits for tasks assessed     SENSATION: Not tested    POSTURE: Slumped to forward posture with heavy use hands on rolling walker  PALPATION: No tenderness to palpation noted in lumbar musculature or glutes  LUMBAR  ROM: Unable to formally test secondary to high levels of pain  AROM 5/13  Flexion 25% limited  Extension 50% limited**  Right lateral flexion 25% limited*  Left lateral flexion 25% limited*  Right rotation   Left rotation    (Blank rows = not tested)  * tightness  ** feels good    Lower Extremity Right 5/13 Left 5/13   A/PROM MMT A/PROM MMT  Hip Flexion WFL 5 WFL 5  Hip Extension 5 3^ 10 3  Hip Abduction      Hip Adduction      Hip Internal rotation 30^  45   Hip External rotation 60^  60   Knee Flexion WFL 4+ WFL 4+  Knee Extension WFL 5 WFL 5  Ankle Dorsiflexion  4+  4+  Ankle Plantarflexion      Ankle Inversion      Ankle Eversion       (Blank rows = not tested) *^pain/uncomfortable   LUMBAR SPECIAL TESTS:  Slump neg B Ely's Test neg B       TODAY'S TREATMENT:                                                                                                                              DATE:   02/28/2024  Therapeutic Exercise: Reviewed entire HEP Aerobic: L1 2 minutes then L2 3 minutes  Supine: bent knee fall outs 2 minutes, LTR 2 minutes, hip IR 3 minutes,  piriformis stretch x3 30 holds B, bridges with post tilt x10  S/l:   Prone:  Seated:  lumbar ROT 2 minutes B x2,  lumbar flexion/extension 2 minutes x2   Standing: lumbar SB at wall 1.5 minutes x2, walking with weight 10# one side 40 feet x3 rounds B, march with contralateral arm overhead reach and one arm support on table x3 10 hlds B   Neuromuscular Re-education: Manual Therapy: Therapeutic Activity: STS slow and controlled 3x5 5 holds, walking with weight 10# one side 40 feet x3 rounds B - to mimic carry groceries and other items.   Self Care: Trigger Point Dry Needling:  Modalities:      PATIENT EDUCATION:  Education details: on HEP Person educated: Patient Education method: Explanation, Facilities manager, and Handouts Education comprehension: verbalized understanding    HOME EXERCISE PROGRAM: [id number]- previous HEP   ASSESSMENT:  CLINICAL IMPRESSION: 02/28/2024 Focused on mobility and strength. Added mobility exercises throughout session to reduce tightness and soreness. Fatigue noted with all exercises but no increase in pain. Added seated and standing stretches to HEP and discussed transition to gym while in PT to make sure patient is confident in returning to gym. Overall patient doing well and will continue to benefit from skilled PT at this time.    EVAL: Patient presents to physical therapy with complaints of lower extremity and core weakness as well as back pain at times with functional movements and activities.  Patient presents with limitation in strength, posture  and range of motion that is likely contributing to current presentation.  Patient is known to this clinic for prior treatment of low back pain after osteomyelitis of L5-S1 last year.  Patient continues to have inflammation in her spine that is likely contributing to weakness as well.  Patient would greatly benefit from skilled physical therapy to address physical impairments and return her to optimal  function.  OBJECTIVE IMPAIRMENTS: Abnormal gait, decreased activity tolerance, decreased balance, decreased coordination, decreased endurance, decreased knowledge of use of DME, decreased mobility, difficulty walking, decreased ROM, decreased strength, improper body mechanics, postural dysfunction, and pain.   ACTIVITY LIMITATIONS: carrying, lifting, bending, sitting, standing, squatting, sleeping, stairs, transfers, bed mobility, bathing, toileting, dressing, hygiene/grooming, and locomotion level  PARTICIPATION LIMITATIONS: meal prep, cleaning, laundry, driving, shopping, community activity, and occupation  PERSONAL FACTORS: Age, Fitness, and 1 comorbidity: hx of Osteomyelitis L5-S1 are also affecting patient's functional outcome.   REHAB POTENTIAL: Good  CLINICAL DECISION MAKING: Stable/uncomplicated  EVALUATION COMPLEXITY: Low   GOALS: Goals reviewed with patient? yes  SHORT TERM GOALS: Target date: 03/11/2024   Patient will be independent in self management strategies to improve quality of life and functional outcomes. Baseline: New Program Goal status: INITIAL  2.  Patient will report at least 50% improvement in overall symptoms and/or function to demonstrate improved functional mobility Baseline: 0% better Goal status: INITIAL  3.  Straight at least 3+ out of 5 MMT strength in bilateral lower extremities in hip extension Baseline: Not able Goal status: INITIAL     LONG TERM GOALS: Target date: 04/22/2024    Patient will report at least 75% improvement in overall symptoms and/or function to demonstrate improved functional mobility Baseline: 0% better Goal status: INITIAL  2.  Patient will score at least 2 points higher on PSFS average to demonstrate change in overall function. Baseline: see above Goal status: INITIAL  3.  Patient will attend gym regularly without fear of injury to her low back to improve ability to continue to work on strength and mobility after  physical therapy Baseline: Fearful unable Goal status: INITIAL     PLAN:  PT FREQUENCY:1- 2x/week for a total of 16 visits over 12 week certification period  PT DURATION: 12 weeks  PLANNED INTERVENTIONS: 97110-Therapeutic exercises, 97530- Therapeutic activity, 97112- Neuromuscular re-education, 97535- Self Care, [id number]- Manual therapy, [phone]- Gait training, [phone]- Orthotic Fit/training, [phone]- Canalith repositioning, [id number]- Aquatic Therapy, 97014- Electrical stimulation (unattended), [phone]- Ionotophoresis 4mg /ml Dexamethasone, Patient/Family education, Balance training, Stair training, Taping, Dry Needling, Joint mobilization, Joint manipulation, Spinal manipulation, Spinal mobilization, Cryotherapy, and Moist heat    PLAN FOR NEXT SESSION:hip add/IR strengthening, hip extension MMT, hip flexors stretches  12:55 PM, 02/28/24 Tabitha Ewings, DPT Physical Therapy with [place]

## 2024-02-28 NOTE — Patient Instructions (Signed)
 Sorry you have to deal with people See you again in 6-8 weeks

## 2024-03-11 ENCOUNTER — Ambulatory Visit (INDEPENDENT_AMBULATORY_CARE_PROVIDER_SITE_OTHER): Admitting: Physical Therapy

## 2024-03-11 ENCOUNTER — Encounter: Payer: Self-pay | Admitting: Physical Therapy

## 2024-03-11 DIAGNOSIS — R262 Difficulty in walking, not elsewhere classified: Secondary | ICD-10-CM

## 2024-03-11 DIAGNOSIS — M6281 Muscle weakness (generalized): Secondary | ICD-10-CM | POA: Diagnosis not present

## 2024-03-11 DIAGNOSIS — M5459 Other low back pain: Secondary | ICD-10-CM | POA: Diagnosis not present

## 2024-03-11 NOTE — Therapy (Signed)
 OUTPATIENT PHYSICAL THERAPY THORACOLUMBAR TREATMENT      Patient Name: Susan [name] MRN: [id number] DOB:July 21, 1976, 48 y.o., female Today's Date: 03/11/2024  END OF SESSION:  PT End of Session - 03/11/24 1301     Visit Number 5    Number of Visits 16    Date for PT Re-Evaluation 04/22/24    PT Start Time 1304    PT Stop Time 1342    PT Time Calculation (min) 38 min    Activity Tolerance Patient tolerated treatment well               Past Medical History:  Diagnosis Date   Abnormal pap 02/2003   CIN 2-3   Basal cell carcinoma of back 07/22/2021   Elevated cholesterol    Gallstones    GERD (gastroesophageal reflux disease)    Hyperlipidemia    Migraine with aura    Past Surgical History:  Procedure Laterality Date   BREAST BIOPSY Left 08/04/2022   US  LT BREAST BX W LOC DEV 1ST LESION IMG BX SPEC US  GUIDE 08/04/2022 [facility]   CERVICAL BIOPSY  W/ LOOP ELECTRODE EXCISION  02/2003   CIN 2-3   CHOLECYSTECTOMY  2015   COSMETIC SURGERY  2009   TEE WITHOUT CARDIOVERSION N/A 06/18/2023   Procedure: TRANSESOPHAGEAL ECHOCARDIOGRAM;  Surgeon: Delford Maude JAYSON, MD;  Location: [facility];  Service: Cardiovascular;  Laterality: N/A;   Patient Active Problem List   Diagnosis Date Noted   Malnutrition of moderate degree 06/19/2023   Staphylococcus aureus infection 06/19/2023   Bacteremia with signs of infection 06/18/2023   Discitis 06/15/2023   Right lateral epicondylitis 04/12/2023   Plantar fasciitis of left foot 08/17/2022   Ascending aorta dilatation -- found on CT Sep 2023 --> Recheck CT in Sep 2024 05/31/2022   Chest wall pain 04/20/2022   Traumatic hematoma of lower leg, left, initial encounter 11/03/2021   Basal cell carcinoma of back 07/22/2021   Dermatofibroma 07/22/2021   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   History of malignant neoplasm of skin 07/22/2021   Lentigo 07/22/2021   Melanocytic nevi of trunk 07/22/2021   Neoplasm of  uncertain behavior of skin 07/22/2021   Other skin changes due to chronic exposure to nonionizing radiation 07/22/2021   Skin tag 07/22/2021   Low back pain 08/03/2020   Nonallopathic lesion of sacral region 08/03/2020   Nonallopathic lesion of thoracic region 08/03/2020   Nonallopathic lesion of lumbar region 08/03/2020   Class 1 obesity due to excess calories without serious comorbidity with body mass index (BMI) of 33.0 to 33.9 in adult 11/17/2017   Hypercholesterolemia, not on statin 08/18/2017   H/O abnormal cervical Papanicolaou smear 01/06/2013    PCP: Job Lukes, PA   REFERRING PROVIDER: Claudene Arthea HERO, DO  REFERRING DIAG:M54.50,G89.29 (ICD-10-CM) - Chronic right-sided low back pain without sciatica  Rationale for Evaluation and Treatment: Rehabilitation  THERAPY DIAG:  Other low back pain  Muscle weakness (generalized)  Difficulty in walking, not elsewhere classified  ONSET DATE:about a month ago  SUBJECTIVE:  SUBJECTIVE STATEMENT: 03/11/2024 States she has been doing her exercises no difficulties and no current pain. Has not made it to the gym yet. States she feels frustrating as she still has pains here and there.   EVAL:  States about a month ago she was working in the yard. States she slid out of a booth and had a seizing up of her low back. States she was still having soreness afterwards. States she got out of habit of her exercises. States she is scared of going to the gym. Last few weeks she has been feeling better in her back. States she has gone back on her anti-inflammatory medication. Sitting for long periods of time and walking fatigues her lower back. Reports discomfort in her right knee after sitting for long periods of time.   PERTINENT HISTORY:  Migraines, GERD  history of osteomyelitis L5/S1  PAIN:  Are you having pain? Yes: NPRS scale: 0/10 Pain location: groin Pain description: spasms, locked up  Aggravating factors: prolonged, static positions, rotational movements  Relieving factors: Medication  PRECAUTIONS: Fall  RED FLAGS: None   WEIGHT BEARING RESTRICTIONS: No  FALLS:  Has patient fallen in last 6 months? 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  DIAGNOSTIC FINDINGS:  Lumbar MRI 08/30/2023   IMPRESSION: 1. Improving/resolving L5-S1 discitis osteomyelitis. Some residual marrow edema but no new or progressive inflammation there. No residual abscess. Mild residual clumping and architectural distortion of the lower cauda equina nerve roots. Erosion of the endplates and severe disc space loss since September combined for moderate bilateral L5 neural foraminal stenosis, mild right lateral recess stenosis, but no significant spinal stenosis there.   2. Other lumbar levels are stable.  Mild degeneration at L4-L5.        PATIENT SURVEYS:  Patient-specific activity functional scoring scheme (Point to one number):  0 represents "unable to perform." 10 represents "able to perform at prior level. 0 1 2 3 4 5 6 7 8 9  10 (Date and Score) Activity Initial  Activity Eval     Sitting for long periods of time (couch)  6    Going to the gym  0    Walking for 30 minutes continuously  0    Additional Additional Total score = sum of the activity scores/number of activities Minimum detectable change (90%CI) for  average score = 2 points Minimum detectable change (90%CI) for single activity score = 3 points PSFS developed by: Rosalee MYRTIS Marvis KYM Charlet CHRISTELLA., & Binkley, J. (1995). Assessing disability and change on individual  patients: a report of a patient specific measure. Physiotherapy [place], 47, [phone]. Reproduced with the permission of the authors  Score: 6/3 = 2   SCREENING FOR RED FLAGS: Bowel or bladder incontinence: No Spinal tumors: No Cauda equina syndrome: No Compression fracture: No Abdominal aneurysm: No  COGNITION: Overall cognitive status: Within functional limits for tasks assessed     SENSATION: Not tested    POSTURE: Slumped to forward posture with heavy use hands on rolling walker  PALPATION: No tenderness to palpation noted in lumbar musculature or glutes  LUMBAR ROM: Unable to formally test secondary to high levels of pain  AROM 5/13  Flexion 25% limited  Extension 50% limited**  Right lateral flexion 25% limited*  Left lateral flexion 25% limited*  Right rotation   Left rotation    (Blank rows = not tested)  * tightness  ** feels good    Lower Extremity Right 5/13 Left 5/13   A/PROM MMT A/PROM MMT  Hip Flexion WFL 5 WFL 5  Hip Extension 5 3^ 10 3  Hip Abduction      Hip Adduction      Hip Internal rotation 30^  45   Hip External rotation 60^  60   Knee Flexion WFL 4+ WFL 4+  Knee Extension WFL 5 WFL 5  Ankle Dorsiflexion  4+  4+  Ankle Plantarflexion      Ankle Inversion      Ankle Eversion       (Blank rows = not tested) *^pain/uncomfortable   LUMBAR SPECIAL TESTS:  Slump neg B Ely's Test neg B       TODAY'S TREATMENT:                                                                                                                              DATE:   03/11/2024  Therapeutic Exercise: Reviewed entire HEP Aerobic: L1 2 minutes then L2 3 minutes Supine:   S/l:   Prone:  Seated:  lumbar ROM all directions for  stretching intermittent between exercises   Standing: chops with blue band x25 B slow and controlled, mini squat side stepping x5 B 30 ft, overhead press with contralateral knee drive 10 holds x5 B with one arm support    Neuromuscular Re-education: Manual Therapy: Therapeutic Activity: STS slow and controlled 3x5 5 holds 2- 5# weights , walking with weight 10# one side 40 feet x3 rounds B - to mimic carry groceries and other items.   Self Care: Trigger Point Dry Needling:  Modalities:      PATIENT EDUCATION:  Education details: on HEP Person educated: Patient Education method: Explanation, Facilities manager, and Handouts Education comprehension: verbalized understanding    HOME EXERCISE PROGRAM: [id number]- previous HEP   ASSESSMENT:  CLINICAL IMPRESSION: 03/11/2024 Focused on strengthening exercises. Fatigue noted, verbal cues for form and  posture. Overall patient doing well but would continue to benefit from increased strengthening at home. Encouraged transition to gym while still in PT. Patient verbalized understanding. Will continue to benefit from skilled PT to return to optimal function.    EVAL: Patient presents to physical therapy with complaints of lower extremity and core weakness as well as back pain at times with functional movements and activities.  Patient presents with limitation in strength, posture and range of motion that is likely contributing to current presentation.  Patient is known to this clinic for prior treatment of low back pain after osteomyelitis of L5-S1 last year.  Patient continues to have inflammation in her  spine that is likely contributing to weakness as well.  Patient would greatly benefit from skilled physical therapy to address physical impairments and return her to optimal function.  OBJECTIVE IMPAIRMENTS: Abnormal gait, decreased activity tolerance, decreased balance, decreased coordination, decreased endurance, decreased knowledge of use of DME,  decreased mobility, difficulty walking, decreased ROM, decreased strength, improper body mechanics, postural dysfunction, and pain.   ACTIVITY LIMITATIONS: carrying, lifting, bending, sitting, standing, squatting, sleeping, stairs, transfers, bed mobility, bathing, toileting, dressing, hygiene/grooming, and locomotion level  PARTICIPATION LIMITATIONS: meal prep, cleaning, laundry, driving, shopping, community activity, and occupation  PERSONAL FACTORS: Age, Fitness, and 1 comorbidity: hx of Osteomyelitis L5-S1 are also affecting patient's functional outcome.   REHAB POTENTIAL: Good  CLINICAL DECISION MAKING: Stable/uncomplicated  EVALUATION COMPLEXITY: Low   GOALS: Goals reviewed with patient? yes  SHORT TERM GOALS: Target date: 03/11/2024   Patient will be independent in self management strategies to improve quality of life and functional outcomes. Baseline: New Program Goal status: INITIAL  2.  Patient will report at least 50% improvement in overall symptoms and/or function to demonstrate improved functional mobility Baseline: 0% better Goal status: INITIAL  3.  Straight at least 3+ out of 5 MMT strength in bilateral lower extremities in hip extension Baseline: Not able Goal status: INITIAL     LONG TERM GOALS: Target date: 04/22/2024    Patient will report at least 75% improvement in overall symptoms and/or function to demonstrate improved functional mobility Baseline: 0% better Goal status: INITIAL  2.  Patient will score at least 2 points higher on PSFS average to demonstrate change in overall function. Baseline: see above Goal status: INITIAL  3.  Patient will attend gym regularly without fear of injury to her low back to improve ability to continue to work on strength and mobility after physical therapy Baseline: Fearful unable Goal status: INITIAL     PLAN:  PT FREQUENCY:1- 2x/week for a total of 16 visits over 12 week certification period  PT DURATION: 12  weeks  PLANNED INTERVENTIONS: 97110-Therapeutic exercises, 97530- Therapeutic activity, 97112- Neuromuscular re-education, 97535- Self Care, [id number]- Manual therapy, [phone]- Gait training, [phone]- Orthotic Fit/training, [phone]- Canalith repositioning, [id number]- Aquatic Therapy, [id number]- Electrical stimulation (unattended), [phone]- Ionotophoresis 4mg /ml Dexamethasone, Patient/Family education, Balance training, Stair training, Taping, Dry Needling, Joint mobilization, Joint manipulation, Spinal manipulation, Spinal mobilization, Cryotherapy, and Moist heat    PLAN FOR NEXT SESSION:hip add/IR strengthening, hip extension MMT, hip flexors stretches  1:44 PM, 03/11/24 Olivia Church, DPT Physical Therapy with [place]

## 2024-03-18 ENCOUNTER — Ambulatory Visit (INDEPENDENT_AMBULATORY_CARE_PROVIDER_SITE_OTHER): Admitting: Physical Therapy

## 2024-03-18 ENCOUNTER — Encounter: Payer: Self-pay | Admitting: Physical Therapy

## 2024-03-18 DIAGNOSIS — R262 Difficulty in walking, not elsewhere classified: Secondary | ICD-10-CM

## 2024-03-18 DIAGNOSIS — M5459 Other low back pain: Secondary | ICD-10-CM

## 2024-03-18 DIAGNOSIS — M6281 Muscle weakness (generalized): Secondary | ICD-10-CM

## 2024-03-18 NOTE — Therapy (Signed)
 OUTPATIENT PHYSICAL THERAPY THORACOLUMBAR TREATMENT      Patient Name: Susan [name] MRN: [id number] DOB:1976/01/15, 48 y.o., female Today's Date: 03/18/2024  END OF SESSION:  PT End of Session - 03/18/24 1216     Visit Number 6    Number of Visits 16    Date for PT Re-Evaluation 04/22/24    PT Start Time 1217    PT Stop Time 1255    PT Time Calculation (min) 38 min    Activity Tolerance Patient tolerated treatment well               Past Medical History:  Diagnosis Date   Abnormal pap 02/2003   CIN 2-3   Basal cell carcinoma of back 07/22/2021   Elevated cholesterol    Gallstones    GERD (gastroesophageal reflux disease)    Hyperlipidemia    Migraine with aura    Past Surgical History:  Procedure Laterality Date   BREAST BIOPSY Left 08/04/2022   US  LT BREAST BX W LOC DEV 1ST LESION IMG BX SPEC US  GUIDE 08/04/2022 [facility]   CERVICAL BIOPSY  W/ LOOP ELECTRODE EXCISION  02/2003   CIN 2-3   CHOLECYSTECTOMY  2015   COSMETIC SURGERY  2009   TEE WITHOUT CARDIOVERSION N/A 06/18/2023   Procedure: TRANSESOPHAGEAL ECHOCARDIOGRAM;  Surgeon: Delford Maude JAYSON, MD;  Location: [facility];  Service: Cardiovascular;  Laterality: N/A;   Patient Active Problem List   Diagnosis Date Noted   Malnutrition of moderate degree 06/19/2023   Staphylococcus aureus infection 06/19/2023   Bacteremia with signs of infection 06/18/2023   Discitis 06/15/2023   Right lateral epicondylitis 04/12/2023   Plantar fasciitis of left foot 08/17/2022   Ascending aorta dilatation -- found on CT Sep 2023 --> Recheck CT in Sep 2024 05/31/2022   Chest wall pain 04/20/2022   Traumatic hematoma of lower leg, left, initial encounter 11/03/2021   Basal cell carcinoma of back 07/22/2021   Dermatofibroma 07/22/2021   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   History of malignant neoplasm of skin 07/22/2021   Lentigo 07/22/2021   Melanocytic nevi of trunk 07/22/2021   Neoplasm of  uncertain behavior of skin 07/22/2021   Other skin changes due to chronic exposure to nonionizing radiation 07/22/2021   Skin tag 07/22/2021   Low back pain 08/03/2020   Nonallopathic lesion of sacral region 08/03/2020   Nonallopathic lesion of thoracic region 08/03/2020   Nonallopathic lesion of lumbar region 08/03/2020   Class 1 obesity due to excess calories without serious comorbidity with body mass index (BMI) of 33.0 to 33.9 in adult 11/17/2017   Hypercholesterolemia, not on statin 08/18/2017   H/O abnormal cervical Papanicolaou smear 01/06/2013    PCP: Job Lukes, PA   REFERRING PROVIDER: Claudene Arthea HERO, DO  REFERRING DIAG:M54.50,G89.29 (ICD-10-CM) - Chronic right-sided low back pain without sciatica  Rationale for Evaluation and Treatment: Rehabilitation  THERAPY DIAG:  Other low back pain  Muscle weakness (generalized)  Difficulty in walking, not elsewhere classified  ONSET DATE:about a month ago  SUBJECTIVE:  SUBJECTIVE STATEMENT: 03/18/2024 Has not gotten to the gym yet. States she is a little tired and sore. States she has been having some right knee pain with her exercises (the squatting).  EVAL:  States about a month ago she was working in the yard. States she slid out of a booth and had a seizing up of her low back. States she was still having soreness afterwards. States she got out of habit of her exercises. States she is scared of going to the gym. Last few weeks she has been feeling better in her back. States she has gone back on her anti-inflammatory medication. Sitting for long periods of time and walking fatigues her lower back. Reports discomfort in her right knee after sitting for long periods of time.   PERTINENT HISTORY:  Migraines, GERD history of osteomyelitis  L5/S1  PAIN:  Are you having pain? Yes: NPRS scale: 0/10 Pain location: groin Pain description: spasms, locked up  Aggravating factors: prolonged, static positions, rotational movements  Relieving factors: Medication  PRECAUTIONS: Fall  RED FLAGS: None   WEIGHT BEARING RESTRICTIONS: No  FALLS:  Has patient fallen in last 6 months? 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  DIAGNOSTIC FINDINGS:  Lumbar MRI 08/30/2023   IMPRESSION: 1. Improving/resolving L5-S1 discitis osteomyelitis. Some residual marrow edema but no new or progressive inflammation there. No residual abscess. Mild residual clumping and architectural distortion of the lower cauda equina nerve roots. Erosion of the endplates and severe disc space loss since September combined for moderate bilateral L5 neural foraminal stenosis, mild right lateral recess stenosis, but no significant spinal stenosis there.   2. Other lumbar levels are stable.  Mild degeneration at L4-L5.        PATIENT SURVEYS:  Patient-specific activity functional scoring scheme (Point to one number):  0 represents "unable to perform." 10 represents "able to perform at prior level. 0 1 2 3 4 5 6 7 8 9  10 (Date and Score) Activity Initial  Activity Eval     Sitting for long periods of time (couch)  6    Going to the gym  0    Walking for 30 minutes continuously  0    Additional Additional Total score = sum of the activity scores/number of activities Minimum detectable change (90%CI) for average score = 2  points Minimum detectable change (90%CI) for single activity score = 3 points PSFS developed by: Rosalee MYRTIS Marvis KYM Charlet CHRISTELLA., & Binkley, J. (1995). Assessing disability and change on individual  patients: a report of a patient specific measure. Physiotherapy [place], 47, [phone]. Reproduced with the permission of the authors  Score: 6/3 = 2   SCREENING FOR RED FLAGS: Bowel or bladder incontinence: No Spinal tumors: No Cauda equina syndrome: No Compression fracture: No Abdominal aneurysm: No  COGNITION: Overall cognitive status: Within functional limits for tasks assessed     SENSATION: Not tested    POSTURE: Slumped to forward posture with heavy use hands on rolling walker  PALPATION: No tenderness to palpation noted in lumbar musculature or glutes  LUMBAR ROM: Unable to formally  test secondary to high levels of pain  AROM 5/13  Flexion 25% limited  Extension 50% limited**  Right lateral flexion 25% limited*  Left lateral flexion 25% limited*  Right rotation   Left rotation    (Blank rows = not tested)  * tightness  ** feels good    Lower Extremity Right 5/13 Left 5/13   A/PROM MMT A/PROM MMT  Hip Flexion WFL 5 WFL 5  Hip Extension 5 3^ 10 3  Hip Abduction      Hip Adduction      Hip Internal rotation 30^  45   Hip External rotation 60^  60   Knee Flexion WFL 4+ WFL 4+  Knee Extension WFL 5 WFL 5  Ankle Dorsiflexion  4+  4+  Ankle Plantarflexion      Ankle Inversion      Ankle Eversion       (Blank rows = not tested) *^pain/uncomfortable   LUMBAR SPECIAL TESTS:  Slump neg B Ely's Test neg B       TODAY'S TREATMENT:                                                                                                                              DATE:   03/18/2024  Therapeutic Exercise: Reviewed entire HEP, discussed transition to gym.  Aerobic:  Supine:thomas stretch x3 30 holds B, LTR 2 minutes,  bridge with post pelvic tilt x10  S/l:    Prone:  Seated: self mobilization with tiger tail 3 minutes to R LE, LAQs  2x15 R 5 holds  Standing: hamstring curl stretch at counter 2 minutes, lumbar side bending x10 5 holds Neuromuscular Re-education: Manual Therapy: Therapeutic Activity: STS slow and controlled 2x10 5 holds 2- 6# weights, walking with 2- 6# weights - tolerated well x5 laps of 50 feet. Step ups 6 with 2 6 pound weight 3x5 B Self Care: Trigger Point Dry Needling:  Modalities:      PATIENT EDUCATION:  Education details: on HEP Person educated: Patient Education method: Explanation, Facilities manager, and Handouts Education comprehension: verbalized understanding    HOME EXERCISE PROGRAM: [id number]- previous HEP   ASSESSMENT:  CLINICAL IMPRESSION: 03/18/2024 Discussed transition to gym prior to end of PT to ensure all questions and concerns are addressed. Focused on mobility secondary to soreness and tightness note. Tolerated exercises well. Will continue with current POC as tolerated.    EVAL: Patient presents to physical therapy with complaints of lower extremity and core weakness as well as back pain at times with functional movements and activities.  Patient presents with limitation in strength, posture and range of motion that is likely contributing to current presentation.  Patient is known to this clinic for prior treatment of low back pain after osteomyelitis of L5-S1 last year.  Patient continues to have inflammation in her spine that is likely contributing to weakness as well.  Patient would greatly benefit from skilled physical therapy to address physical  impairments and return her to optimal function.  OBJECTIVE IMPAIRMENTS: Abnormal gait, decreased activity tolerance, decreased balance, decreased coordination, decreased endurance, decreased knowledge of use of DME, decreased mobility, difficulty walking, decreased ROM, decreased strength, improper body mechanics, postural dysfunction, and pain.    ACTIVITY LIMITATIONS: carrying, lifting, bending, sitting, standing, squatting, sleeping, stairs, transfers, bed mobility, bathing, toileting, dressing, hygiene/grooming, and locomotion level  PARTICIPATION LIMITATIONS: meal prep, cleaning, laundry, driving, shopping, community activity, and occupation  PERSONAL FACTORS: Age, Fitness, and 1 comorbidity: hx of Osteomyelitis L5-S1 are also affecting patient's functional outcome.   REHAB POTENTIAL: Good  CLINICAL DECISION MAKING: Stable/uncomplicated  EVALUATION COMPLEXITY: Low   GOALS: Goals reviewed with patient? yes  SHORT TERM GOALS: Target date: 03/11/2024   Patient will be independent in self management strategies to improve quality of life and functional outcomes. Baseline: New Program Goal status: INITIAL  2.  Patient will report at least 50% improvement in overall symptoms and/or function to demonstrate improved functional mobility Baseline: 0% better Goal status: INITIAL  3.  Straight at least 3+ out of 5 MMT strength in bilateral lower extremities in hip extension Baseline: Not able Goal status: INITIAL     LONG TERM GOALS: Target date: 04/22/2024    Patient will report at least 75% improvement in overall symptoms and/or function to demonstrate improved functional mobility Baseline: 0% better Goal status: INITIAL  2.  Patient will score at least 2 points higher on PSFS average to demonstrate change in overall function. Baseline: see above Goal status: INITIAL  3.  Patient will attend gym regularly without fear of injury to her low back to improve ability to continue to work on strength and mobility after physical therapy Baseline: Fearful unable Goal status: INITIAL     PLAN:  PT FREQUENCY:1- 2x/week for a total of 16 visits over 12 week certification period  PT DURATION: 12 weeks  PLANNED INTERVENTIONS: 97110-Therapeutic exercises, 97530- Therapeutic activity, 97112- Neuromuscular re-education, 97535-  Self Care, 02859- Manual therapy, [phone]- Gait training, [phone]- Orthotic Fit/training, [phone]- Canalith repositioning, [id number]- Aquatic Therapy, 97014- Electrical stimulation (unattended), [phone]- Ionotophoresis 4mg /ml Dexamethasone, Patient/Family education, Balance training, Stair training, Taping, Dry Needling, Joint mobilization, Joint manipulation, Spinal manipulation, Spinal mobilization, Cryotherapy, and Moist heat    PLAN FOR NEXT SESSION:hip add/IR strengthening, hip extension MMT, hip flexors stretches  12:55 PM, 03/18/24 Olivia Church, DPT Physical Therapy with [place]

## 2024-03-20 ENCOUNTER — Encounter: Payer: Self-pay | Admitting: Physical Therapy

## 2024-03-20 ENCOUNTER — Ambulatory Visit (INDEPENDENT_AMBULATORY_CARE_PROVIDER_SITE_OTHER): Admitting: Physical Therapy

## 2024-03-20 DIAGNOSIS — M5459 Other low back pain: Secondary | ICD-10-CM

## 2024-03-20 DIAGNOSIS — M6281 Muscle weakness (generalized): Secondary | ICD-10-CM | POA: Diagnosis not present

## 2024-03-20 DIAGNOSIS — R262 Difficulty in walking, not elsewhere classified: Secondary | ICD-10-CM

## 2024-03-20 NOTE — Therapy (Signed)
 OUTPATIENT PHYSICAL THERAPY THORACOLUMBAR TREATMENT      Patient Name: Susan [name] MRN: [id number] DOB:12-25-1975, 48 y.o., female Today's Date: 03/20/2024  END OF SESSION:  PT End of Session - 03/20/24 1300     Visit Number 7    Number of Visits 16    Date for PT Re-Evaluation 04/22/24    PT Start Time 1301    PT Stop Time 1340    PT Time Calculation (min) 39 min    Activity Tolerance Patient tolerated treatment well               Past Medical History:  Diagnosis Date   Abnormal pap 02/2003   CIN 2-3   Basal cell carcinoma of back 07/22/2021   Elevated cholesterol    Gallstones    GERD (gastroesophageal reflux disease)    Hyperlipidemia    Migraine with aura    Past Surgical History:  Procedure Laterality Date   BREAST BIOPSY Left 08/04/2022   US  LT BREAST BX W LOC DEV 1ST LESION IMG BX SPEC US  GUIDE 08/04/2022 [facility]   CERVICAL BIOPSY  W/ LOOP ELECTRODE EXCISION  02/2003   CIN 2-3   CHOLECYSTECTOMY  2015   COSMETIC SURGERY  2009   TEE WITHOUT CARDIOVERSION N/A 06/18/2023   Procedure: TRANSESOPHAGEAL ECHOCARDIOGRAM;  Surgeon: Delford Maude JAYSON, MD;  Location: [facility];  Service: Cardiovascular;  Laterality: N/A;   Patient Active Problem List   Diagnosis Date Noted   Malnutrition of moderate degree 06/19/2023   Staphylococcus aureus infection 06/19/2023   Bacteremia with signs of infection 06/18/2023   Discitis 06/15/2023   Right lateral epicondylitis 04/12/2023   Plantar fasciitis of left foot 08/17/2022   Ascending aorta dilatation -- found on CT Sep 2023 --> Recheck CT in Sep 2024 05/31/2022   Chest wall pain 04/20/2022   Traumatic hematoma of lower leg, left, initial encounter 11/03/2021   Basal cell carcinoma of back 07/22/2021   Dermatofibroma 07/22/2021   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   History of malignant neoplasm of skin 07/22/2021   Lentigo 07/22/2021   Melanocytic nevi of trunk 07/22/2021   Neoplasm of  uncertain behavior of skin 07/22/2021   Other skin changes due to chronic exposure to nonionizing radiation 07/22/2021   Skin tag 07/22/2021   Low back pain 08/03/2020   Nonallopathic lesion of sacral region 08/03/2020   Nonallopathic lesion of thoracic region 08/03/2020   Nonallopathic lesion of lumbar region 08/03/2020   Class 1 obesity due to excess calories without serious comorbidity with body mass index (BMI) of 33.0 to 33.9 in adult 11/17/2017   Hypercholesterolemia, not on statin 08/18/2017   H/O abnormal cervical Papanicolaou smear 01/06/2013    PCP: Job Lukes, PA   REFERRING PROVIDER: Claudene Arthea HERO, DO  REFERRING DIAG:M54.50,G89.29 (ICD-10-CM) - Chronic right-sided low back pain without sciatica  Rationale for Evaluation and Treatment: Rehabilitation  THERAPY DIAG:  Other low back pain  Muscle weakness (generalized)  Difficulty in walking, not elsewhere classified  ONSET DATE:about a month ago  SUBJECTIVE:  SUBJECTIVE STATEMENT: 03/20/2024 Went to [place] and   EVAL:  States about a month ago she was working in the yard. States she slid out of a booth and had a seizing up of her low back. States she was still having soreness afterwards. States she got out of habit of her exercises. States she is scared of going to the gym. Last few weeks she has been feeling better in her back. States she has gone back on her anti-inflammatory medication. Sitting for long periods of time and walking fatigues her lower back. Reports discomfort in her right knee after sitting for long periods of time.   PERTINENT HISTORY:  Migraines, GERD history of osteomyelitis L5/S1  PAIN:  Are you having pain? Yes: NPRS scale: 0/10 Pain location: groin Pain description: spasms, locked up  Aggravating  factors: prolonged, static positions, rotational movements  Relieving factors: Medication  PRECAUTIONS: Fall  RED FLAGS: None   WEIGHT BEARING RESTRICTIONS: No  FALLS:  Has patient fallen in last 6 months? 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  DIAGNOSTIC FINDINGS:  Lumbar MRI 08/30/2023   IMPRESSION: 1. Improving/resolving L5-S1 discitis osteomyelitis. Some residual marrow edema but no new or progressive inflammation there. No residual abscess. Mild residual clumping and architectural distortion of the lower cauda equina nerve roots. Erosion of the endplates and severe disc space loss since September combined for moderate bilateral L5 neural foraminal stenosis, mild right lateral recess stenosis, but no significant spinal stenosis there.   2. Other lumbar levels are stable.  Mild degeneration at L4-L5.        PATIENT SURVEYS:  Patient-specific activity functional scoring scheme (Point to one number):  0 represents "unable to perform." 10 represents "able to perform at prior level. 0 1 2 3 4 5 6 7 8 9  10 (Date and Score) Activity Initial  Activity Eval     Sitting for long periods of time (couch)  6    Going to the gym  0    Walking for 30 minutes continuously  0    Additional Additional Total score = sum of the activity scores/number of activities Minimum detectable change (90%CI) for average score = 2 points Minimum detectable change (90%CI) for single activity score = 3 points PSFS developed by: Rosalee MYRTIS Marvis KYM Charlet CHRISTELLA., &  Binkley, J. (1995). Assessing disability and change on individual  patients: a report of a patient specific measure. Physiotherapy [place], 47, [phone]. Reproduced with the permission of the authors  Score: 6/3 = 2   SCREENING FOR RED FLAGS: Bowel or bladder incontinence: No Spinal tumors: No Cauda equina syndrome: No Compression fracture: No Abdominal aneurysm: No  COGNITION: Overall cognitive status: Within functional limits for tasks assessed     SENSATION: Not tested    POSTURE: Slumped to forward posture with heavy use hands on rolling walker  PALPATION: No tenderness to palpation noted in lumbar musculature or glutes  LUMBAR ROM: Unable to formally test secondary to high levels of pain  AROM 5/13  Flexion 25% limited  Extension 50% limited**  Right lateral flexion 25% limited*  Left lateral flexion 25% limited*  Right rotation   Left rotation    (Blank rows = not tested)  * tightness  ** feels good    Lower Extremity Right 5/13 Left 5/13   A/PROM MMT A/PROM MMT  Hip Flexion WFL 5 WFL 5  Hip Extension 5 3^ 10 3  Hip Abduction      Hip Adduction      Hip Internal rotation 30^  45   Hip External rotation 60^  60   Knee Flexion WFL 4+ WFL 4+  Knee Extension WFL 5 WFL 5  Ankle Dorsiflexion  4+  4+  Ankle Plantarflexion      Ankle Inversion      Ankle Eversion       (Blank rows = not tested) *^pain/uncomfortable   LUMBAR SPECIAL TESTS:  Slump neg B Ely's Test neg B       TODAY'S TREATMENT:                                                                                                                              DATE:   03/20/2024  Therapeutic Exercise: Reviewed entire HEP,  use of medbridge, different types of exercises on plan and what they focus on. On going over exercises with gym attendant  Aerobic:  Supine: S/l:  clamshells 2x10 B, reverse clamshells 2x10  Prone:  Seated:  modified child's pose L/R/C with ball assist 3  minutes  Standing:  chair pose with thigh squeeze x6 15 holds, crossover step ups 6 step 2x10 B Neuromuscular Re-education:hip hinge fwd reach over counter - 7 minutes  Manual Therapy: Therapeutic Activity:   Self Care: Trigger Point Dry Needling:  Modalities:      PATIENT EDUCATION:  Education details: on HEP Person educated: Patient Education method: Explanation, Facilities manager, and Handouts Education comprehension: verbalized understanding    HOME EXERCISE PROGRAM: [id number]- previous HEP   ASSESSMENT:  CLINICAL IMPRESSION: 03/20/2024 Session focused on answering all questions about transition to gym. Reviewed and updated HEP and wrote down continue goals for upcoming transition. Added additional strengthening exercises. No pain noted during session. Will continue with current POC as tolerated.  EVAL: Patient presents to physical therapy with complaints of lower extremity and core weakness as well as back pain at times with functional movements and activities.  Patient presents with limitation in strength, posture and range of motion that is likely contributing to current presentation.  Patient is known to this clinic for prior treatment of low back pain after osteomyelitis of L5-S1 last year.  Patient continues to have inflammation in her spine that is likely contributing to weakness as well.  Patient would greatly benefit from skilled physical therapy to address physical impairments and return her to optimal function.  OBJECTIVE IMPAIRMENTS: Abnormal gait, decreased activity tolerance, decreased balance, decreased coordination, decreased endurance, decreased knowledge of use of DME, decreased mobility, difficulty walking, decreased ROM, decreased strength, improper body mechanics, postural dysfunction, and pain.  ACTIVITY LIMITATIONS: carrying, lifting, bending, sitting, standing, squatting, sleeping, stairs, transfers, bed mobility, bathing, toileting, dressing, hygiene/grooming,  and locomotion level  PARTICIPATION LIMITATIONS: meal prep, cleaning, laundry, driving, shopping, community activity, and occupation  PERSONAL FACTORS: Age, Fitness, and 1 comorbidity: hx of Osteomyelitis L5-S1 are also affecting patient's functional outcome.   REHAB POTENTIAL: Good  CLINICAL DECISION MAKING: Stable/uncomplicated  EVALUATION COMPLEXITY: Low   GOALS: Goals reviewed with patient? yes  SHORT TERM GOALS: Target date: 03/11/2024   Patient will be independent in self management strategies to improve quality of life and functional outcomes. Baseline: New Program Goal status: INITIAL  2.  Patient will report at least 50% improvement in overall symptoms and/or function to demonstrate improved functional mobility Baseline: 0% better Goal status: INITIAL  3.  Straight at least 3+ out of 5 MMT strength in bilateral lower extremities in hip extension Baseline: Not able Goal status: INITIAL     LONG TERM GOALS: Target date: 04/22/2024    Patient will report at least 75% improvement in overall symptoms and/or function to demonstrate improved functional mobility Baseline: 0% better Goal status: INITIAL  2.  Patient will score at least 2 points higher on PSFS average to demonstrate change in overall function. Baseline: see above Goal status: INITIAL  3.  Patient will attend gym regularly without fear of injury to her low back to improve ability to continue to work on strength and mobility after physical therapy Baseline: Fearful unable Goal status: INITIAL     PLAN:  PT FREQUENCY:1- 2x/week for a total of 16 visits over 12 week certification period  PT DURATION: 12 weeks  PLANNED INTERVENTIONS: 97110-Therapeutic exercises, 97530- Therapeutic activity, 97112- Neuromuscular re-education, 97535- Self Care, 02859- Manual therapy, [phone]- Gait training, [phone]- Orthotic Fit/training, [phone]- Canalith repositioning, [id number]- Aquatic Therapy, 97014- Electrical stimulation  (unattended), [phone]- Ionotophoresis 4mg /ml Dexamethasone, Patient/Family education, Balance training, Stair training, Taping, Dry Needling, Joint mobilization, Joint manipulation, Spinal manipulation, Spinal mobilization, Cryotherapy, and Moist heat    PLAN FOR NEXT SESSION:hip add/IR strengthening, hip extension MMT, hip flexors stretches  1:41 PM, 03/20/24 Olivia Church, DPT Physical Therapy with [facility]

## 2024-03-25 ENCOUNTER — Encounter: Payer: Self-pay | Admitting: Physical Therapy

## 2024-03-25 ENCOUNTER — Ambulatory Visit (INDEPENDENT_AMBULATORY_CARE_PROVIDER_SITE_OTHER): Admitting: Physical Therapy

## 2024-03-25 DIAGNOSIS — M6281 Muscle weakness (generalized): Secondary | ICD-10-CM | POA: Diagnosis not present

## 2024-03-25 DIAGNOSIS — M5459 Other low back pain: Secondary | ICD-10-CM

## 2024-03-25 DIAGNOSIS — R262 Difficulty in walking, not elsewhere classified: Secondary | ICD-10-CM | POA: Diagnosis not present

## 2024-03-25 NOTE — Therapy (Signed)
 OUTPATIENT PHYSICAL THERAPY THORACOLUMBAR TREATMENT  PHYSICAL THERAPY DISCHARGE SUMMARY  Visits from Start of Care: 8  Current functional level related to goals / functional outcomes: See below    Remaining deficits: See below   Education / Equipment: See below   Patient agrees to discharge. Patient goals were partially met. Patient is being discharged due to being pleased with the current functional level.     Patient Name: Susan [name] MRN: [id number] DOB:1976/02/28, 48 y.o., female Today's Date: 03/25/2024  END OF SESSION:  PT End of Session - 03/25/24 1218     Visit Number 8    Number of Visits 16    Date for PT Re-Evaluation 04/22/24    PT Start Time 1218    PT Stop Time 1257    PT Time Calculation (min) 39 min    Activity Tolerance Patient tolerated treatment well               Past Medical History:  Diagnosis Date   Abnormal pap 02/2003   CIN 2-3   Basal cell carcinoma of back 07/22/2021   Elevated cholesterol    Gallstones    GERD (gastroesophageal reflux disease)    Hyperlipidemia    Migraine with aura    Past Surgical History:  Procedure Laterality Date   BREAST BIOPSY Left 08/04/2022   US  LT BREAST BX W LOC DEV 1ST LESION IMG BX SPEC US  GUIDE 08/04/2022 [facility]   CERVICAL BIOPSY  W/ LOOP ELECTRODE EXCISION  02/2003   CIN 2-3   CHOLECYSTECTOMY  2015   COSMETIC SURGERY  2009   TEE WITHOUT CARDIOVERSION N/A 06/18/2023   Procedure: TRANSESOPHAGEAL ECHOCARDIOGRAM;  Surgeon: Delford Maude JAYSON, MD;  Location: [facility];  Service: Cardiovascular;  Laterality: N/A;   Patient Active Problem List   Diagnosis Date Noted   Malnutrition of moderate degree 06/19/2023   Staphylococcus aureus infection 06/19/2023   Bacteremia with signs of infection 06/18/2023   Discitis 06/15/2023   Right lateral epicondylitis 04/12/2023   Plantar fasciitis of left foot 08/17/2022   Ascending aorta dilatation -- found on CT Sep 2023 -->  Recheck CT in Sep 2024 05/31/2022   Chest wall pain 04/20/2022   Traumatic hematoma of lower leg, left, initial encounter 11/03/2021   Basal cell carcinoma of back 07/22/2021   Dermatofibroma 07/22/2021   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   History of malignant neoplasm of skin 07/22/2021   Lentigo 07/22/2021   Melanocytic nevi of trunk 07/22/2021   Neoplasm of uncertain behavior of skin 07/22/2021   Other skin changes due to chronic exposure to nonionizing radiation 07/22/2021   Skin tag 07/22/2021   Low back pain 08/03/2020   Nonallopathic lesion of sacral region 08/03/2020   Nonallopathic lesion of thoracic region 08/03/2020   Nonallopathic lesion of lumbar region 08/03/2020   Class 1 obesity due to excess calories without serious comorbidity with body mass index (BMI) of 33.0 to 33.9 in adult 11/17/2017   Hypercholesterolemia, not on statin 08/18/2017   H/O abnormal cervical Papanicolaou smear 01/06/2013    PCP: Job Lukes, PA   REFERRING PROVIDER: Claudene Arthea HERO, DO  REFERRING DIAG:M54.50,G89.29 (ICD-10-CM) - Chronic right-sided low back pain without sciatica  Rationale for Evaluation and Treatment: Rehabilitation  THERAPY DIAG:  Other low back pain  Muscle weakness (generalized)  Difficulty in walking, not elsewhere classified  ONSET DATE:about a month ago  SUBJECTIVE:  SUBJECTIVE STATEMENT: 03/25/2024 States she has not gone to the gym yet. States on Sunday she had a bad back spasm and she didn't do much the day before. Feeling OK today. Reports she feels 95% better.   EVAL:  States about a month ago she was working in the yard. States she slid out of a booth and had a seizing up of her low back. States she was still having soreness afterwards. States she got out of habit of her  exercises. States she is scared of going to the gym. Last few weeks she has been feeling better in her back. States she has gone back on her anti-inflammatory medication. Sitting for long periods of time and walking fatigues her lower back. Reports discomfort in her right knee after sitting for long periods of time.   PERTINENT HISTORY:  Migraines, GERD history of osteomyelitis L5/S1  PAIN:  Are you having pain? Yes: NPRS scale: 0/10 Pain location: groin Pain description: spasms, locked up  Aggravating factors: prolonged, static positions, rotational movements  Relieving factors: Medication  PRECAUTIONS: Fall  RED FLAGS: None   WEIGHT BEARING RESTRICTIONS: No  FALLS:  Has patient fallen in last 6 months? 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  DIAGNOSTIC FINDINGS:  Lumbar MRI 08/30/2023   IMPRESSION: 1. Improving/resolving L5-S1 discitis osteomyelitis. Some residual marrow edema but no new or progressive inflammation there. No residual abscess. Mild residual clumping and architectural distortion of the lower cauda equina nerve roots. Erosion of the endplates and severe disc space loss since September combined for moderate bilateral L5 neural foraminal stenosis, mild right lateral recess stenosis, but no significant spinal stenosis there.   2. Other lumbar levels are stable.  Mild degeneration at L4-L5.        PATIENT SURVEYS:  Patient-specific activity functional scoring scheme (Point to one number):  0 represents "unable to perform." 10 represents  "able to perform at prior level. 0 1 2 3 4 5 6 7 8 9  10 (Date and Score) Activity Initial  Activity Eval  7/8   Sitting for long periods of time (couch)  6  6  Going to the gym  0  7  Walking for 30 minutes continuously  0 8   Additional Additional Total score = sum of the activity scores/number of activities Minimum detectable change (90%CI) for average score = 2 points Minimum detectable change (90%CI) for single activity score = 3 points PSFS developed by: Rosalee MYRTIS Marvis KYM Charlet CHRISTELLA., & Binkley, J. (1995). Assessing disability and change on individual  patients: a report of a patient specific measure. Physiotherapy [place], 47, [phone]. Reproduced with the permission of the authors  Score: EVAL: 6/3 = 2  7/8: 21/3=7   SCREENING FOR RED FLAGS: Bowel or bladder incontinence: No Spinal tumors: No Cauda equina syndrome: No Compression fracture: No Abdominal aneurysm: No  COGNITION: Overall cognitive status: Within functional limits for tasks assessed     SENSATION: Not tested    POSTURE: Slumped to forward posture with heavy use hands on rolling walker  PALPATION: No tenderness to palpation noted in  lumbar musculature or glutes  LUMBAR ROM: Unable to formally test secondary to high levels of pain  AROM 7/8  Flexion 25% limited  Extension 50% limited*  Right lateral flexion 25% limited  Left lateral flexion 25% limited  Right rotation   Left rotation    (Blank rows = not tested)  * twingy    Lower Extremity Right 7/8 Left 7/8   A/PROM MMT A/PROM MMT  Hip Flexion WFL 5 WFL 5  Hip Extension 5 3 10 3   Hip Abduction      Hip Adduction      Hip Internal rotation 45  45   Hip External rotation 60  60   Knee Flexion WFL 4+ WFL 4+  Knee Extension WFL 5 WFL 5  Ankle Dorsiflexion  4+  4+  Ankle Plantarflexion      Ankle Inversion      Ankle Eversion       (Blank rows = not tested) *^pain/uncomfortable   LUMBAR SPECIAL TESTS:  Slump neg  B Ely's Test neg B       TODAY'S TREATMENT:                                                                                                                              DATE:   03/25/2024  Therapeutic Exercise: Reviewed entire HEP, objective measures updated. Reviewed all gym equipment and how to safely use different equipment and thinks to focus on and things to avoid. On walking program and how to achieve walking goal.  Supine: LTR 1 minutes, SKC 2 minute, piriformis stretch 1 minute each leg S/l:   Prone:  Seated:    Standing: Neuromuscular Re-education: Manual Therapy: Therapeutic Activity:   Self Care: Trigger Point Dry Needling:  Modalities:      PATIENT EDUCATION:  Education details: on HEP, see above Person educated: Patient Education method: Explanation, Demonstration, and Handouts Education comprehension: verbalized understanding    HOME EXERCISE PROGRAM: [id number]- previous HEP   ASSESSMENT:  CLINICAL IMPRESSION: 03/25/2024 Overall patient doing well. She has met all but one short and one long term goal. Answered all questions and reviewed home program as well as gym equipment to focus on and things to avoid. Discussed only performing pain-free exercises, starting with low resistance and not adding everything in all at once. Patient to discharge from PT to HEP secondary to progress. Encouraged patient to f/u with MD if any questions or concerns arise.   EVAL: Patient presents to physical therapy with complaints of lower extremity and core weakness as well as back pain at times with functional movements and activities.  Patient presents with limitation in strength, posture and range of motion that is likely contributing to current presentation.  Patient is known to this clinic for prior treatment of low back pain after osteomyelitis of L5-S1 last year.  Patient continues to have inflammation in her spine that is likely contributing to weakness as well.  Patient would  greatly benefit from skilled physical therapy to address physical impairments and return her to optimal function.  OBJECTIVE IMPAIRMENTS: Abnormal gait, decreased activity tolerance, decreased balance, decreased coordination, decreased endurance, decreased knowledge of use of DME, decreased mobility, difficulty walking, decreased ROM, decreased strength, improper body mechanics, postural dysfunction, and pain.   ACTIVITY LIMITATIONS: carrying, lifting, bending, sitting, standing, squatting, sleeping, stairs, transfers, bed mobility, bathing, toileting, dressing, hygiene/grooming, and locomotion level  PARTICIPATION LIMITATIONS: meal prep, cleaning, laundry, driving, shopping, community activity, and occupation  PERSONAL FACTORS: Age, Fitness, and 1 comorbidity: hx of Osteomyelitis L5-S1 are also affecting patient's functional outcome.   REHAB POTENTIAL: Good  CLINICAL DECISION MAKING: Stable/uncomplicated  EVALUATION COMPLEXITY: Low   GOALS: Goals reviewed with patient? yes  SHORT TERM GOALS: Target date: 03/11/2024   Patient will be independent in self management strategies to improve quality of life and functional outcomes. Baseline: New Program Goal status: MET  2.  Patient will report at least 50% improvement in overall symptoms and/or function to demonstrate improved functional mobility Baseline: 0% better Goal status: MET  3.  Straight at least 3+ out of 5 MMT strength in bilateral lower extremities in hip extension Baseline: Not able Goal status: PROGRESSING     LONG TERM GOALS: Target date: 04/22/2024    Patient will report at least 75% improvement in overall symptoms and/or function to demonstrate improved functional mobility Baseline: 0% better Goal status: MET  2.  Patient will score at least 2 points higher on PSFS average to demonstrate change in overall function. Baseline: see above Goal status: MET  3.  Patient will attend gym regularly without fear of injury  to her low back to improve ability to continue to work on strength and mobility after physical therapy Baseline: Fearful unable Goal status: PROGRESSING     PLAN:  PT FREQUENCY:1- 2x/week for a total of 16 visits over 12 week certification period  PT DURATION: 12 weeks  PLANNED INTERVENTIONS: 97110-Therapeutic exercises, 97530- Therapeutic activity, 97112- Neuromuscular re-education, 97535- Self Care, 02859- Manual therapy, [phone]- Gait training, [phone]- Orthotic Fit/training, [phone]- Canalith repositioning, [id number]- Aquatic Therapy, 97014- Electrical stimulation (unattended), [phone]- Ionotophoresis 4mg /ml Dexamethasone, Patient/Family education, Balance training, Stair training, Taping, Dry Needling, Joint mobilization, Joint manipulation, Spinal manipulation, Spinal mobilization, Cryotherapy, and Moist heat    PLAN FOR NEXT SESSION:DC to HEP   12:59 PM, 03/25/24 Olivia Church, DPT Physical Therapy with [facility]

## 2024-04-04 ENCOUNTER — Encounter: Payer: Self-pay | Admitting: Advanced Practice Midwife

## 2024-04-10 NOTE — Progress Notes (Unsigned)
  Darlyn Claudene [facility] [address] [place] [address] Phone: [phone] Subjective:   Susan [name], am serving as a scribe for Dr. Arthea Claudene.  I'm seeing this patient by the request  of:  Job Lukes, [name]  CC: Back and neck pain  YEP:Dlagzrupcz  Susan [name] is a 48 y.o. female coming in with complaint of back and neck pain. OMT 02/28/2024. Patient states that she joined [employer] and she has been moving more.   Medications patient has been prescribed: None  Taking:         Reviewed prior external information including notes and imaging from previsou exam, outside providers and external EMR if available.   As well as notes that were available from care everywhere and other healthcare systems.  Past medical history, social, surgical and family history all reviewed in electronic medical record.  No pertanent information unless stated regarding to the chief complaint.   Past Medical History:  Diagnosis Date   Abnormal pap 02/2003   CIN 2-3   Basal cell carcinoma of back 07/22/2021   Elevated cholesterol    Gallstones    GERD (gastroesophageal reflux disease)    Hyperlipidemia    Migraine with aura     No Known Allergies   Review of Systems:  No headache, visual changes, nausea, vomiting, diarrhea, constipation, dizziness, abdominal pain, skin rash, fevers, chills, night sweats, weight loss, swollen lymph nodes, body aches, joint swelling, chest pain, shortness of breath, mood changes. POSITIVE muscle aches  Objective  Blood pressure (!) 124/92, pulse 61, height 5' 8 (1.727 m), weight 221 lb (100.2 kg), SpO2 98%.   General: No apparent distress alert and oriented x3 mood and affect normal, dressed appropriately.  HEENT: Pupils equal, extraocular movements intact  Respiratory: Patient's speak in full sentences and does not appear short of breath  Cardiovascular: No lower extremity edema, non tender, no erythema   Gait MSK:  Back does have some loss lordosis.  Patient does have improvement in core strength.  Hip abductor strengthening noted.  Osteopathic findings  C2 flexed rotated and side bent right C7 flexed rotated and side bent left T3 extended rotated and side bent right inhaled rib T7 extended rotated and side bent left L2 flexed rotated and side bent right L4 flexed rotated and side bent left Sacrum right on right       Assessment and Plan:  No problem-specific Assessment & Plan notes found for this encounter.    Nonallopathic problems  Decision today to treat with OMT was based on Physical Exam  After verbal consent patient was treated with HVLA, ME, FPR techniques in cervical, rib, thoracic, lumbar, and sacral  areas  Patient tolerated the procedure well with improvement in symptoms  Patient given exercises, stretches and lifestyle modifications  See medications in patient instructions if given  Patient will follow up in 4-8 weeks    The above documentation has been reviewed and is accurate and complete Susan Hoffer M Hannia Matchett, DO          Note: This dictation was prepared with Dragon dictation along with smaller phrase technology. Any transcriptional errors that result from this process are unintentional.

## 2024-04-11 ENCOUNTER — Ambulatory Visit: Admitting: Family Medicine

## 2024-04-11 ENCOUNTER — Encounter: Payer: Self-pay | Admitting: Family Medicine

## 2024-04-11 VITALS — BP 124/92 | HR 61 | Ht 68.0 in | Wt 221.0 lb

## 2024-04-11 DIAGNOSIS — M9903 Segmental and somatic dysfunction of lumbar region: Secondary | ICD-10-CM | POA: Diagnosis not present

## 2024-04-11 DIAGNOSIS — M9908 Segmental and somatic dysfunction of rib cage: Secondary | ICD-10-CM

## 2024-04-11 DIAGNOSIS — M9904 Segmental and somatic dysfunction of sacral region: Secondary | ICD-10-CM | POA: Diagnosis not present

## 2024-04-11 DIAGNOSIS — M9901 Segmental and somatic dysfunction of cervical region: Secondary | ICD-10-CM | POA: Diagnosis not present

## 2024-04-11 DIAGNOSIS — M545 Low back pain, unspecified: Secondary | ICD-10-CM

## 2024-04-11 DIAGNOSIS — G8929 Other chronic pain: Secondary | ICD-10-CM | POA: Diagnosis not present

## 2024-04-11 DIAGNOSIS — M9902 Segmental and somatic dysfunction of thoracic region: Secondary | ICD-10-CM

## 2024-04-11 NOTE — Patient Instructions (Signed)
 Stay boring Hit the gym See me in early September

## 2024-04-11 NOTE — Assessment & Plan Note (Signed)
 Doing much better overall.  This is after patient's staph infection.  Believe this is healed from that and encourage patient to continue to increase activity as tolerated.  Discussed icing regimen and home exercises, discussed which activities to do in which ones to avoid.  Increase activity slowly.  Follow-up again in 6 to 8 weeks otherwise.

## 2024-05-22 ENCOUNTER — Encounter: Payer: Self-pay | Admitting: Cardiology

## 2024-05-27 NOTE — Progress Notes (Unsigned)
 [name] [address] [place] [address] Phone: [phone] Subjective:   Susan [name], am serving as a scribe for Dr. Arthea Claudene.  I'[name] seeing this patient by the request  of:  Susan [name], Susan [name]  CC: Back and neck pain follow-up  YEP:Dlagzrupcz  Susan [name] is a 48 y.o. female coming in with complaint of back and neck pain. OMT on 04/11/2024. Patient states that she is doing well. Going to the gym which has been helpful. Still has pain early in morning.   Medications patient has been prescribed:   Taking:         Reviewed prior external information including notes and imaging from previsou exam, outside providers and external EMR if available.   As well as notes that were available from care everywhere and other healthcare systems.  Past medical history, social, surgical and family history all reviewed in electronic medical record.  No pertanent information unless stated regarding to the chief complaint.   Past Medical History:  Diagnosis Date   Abnormal pap 02/2003   CIN 2-3   Basal cell carcinoma of back 07/22/2021   Elevated cholesterol    Gallstones    GERD (gastroesophageal reflux disease)    Hyperlipidemia    Migraine with aura     No Known Allergies   Review of Systems:  No headache, visual changes, nausea, vomiting, diarrhea, constipation, dizziness, abdominal pain, skin rash, fevers, chills, night sweats, weight loss, swollen lymph nodes, body aches, joint swelling, chest pain, shortness of breath, mood changes. POSITIVE muscle aches  Objective  Blood pressure 112/84, pulse 76, height 5' 8 (1.727 [name]), weight 219 lb (99.3 kg), SpO2 96%.   General: No apparent distress alert and oriented x3 mood and affect normal, dressed appropriately.  HEENT: Pupils equal, extraocular movements intact  Respiratory: Patient's speak in full sentences and does not appear short of breath  Cardiovascular: No lower  extremity edema, non tender, no erythema  Gait MSK:  Back still significant tightness noted in the paraspinal musculature.  He is still having some tightness noted in the thoracolumbar as well as in the lumbosacral area.  Osteopathic findings C6 flexed rotated and side bent left T3 extended rotated and side bent right inhaled rib T11 extended rotated and side bent left L1 flexed rotated and side bent right Sacrum right on right    Assessment and Plan:  Low back pain Low back pain again noted.  Patient is very concerned since patient was last in her [place] trip and patient did not have the osteomyelitis.  Discussed with patient about hydroxyzine  for situational anxiety and prescribed appropriately.  Discussed which activities to do and which ones to avoid.  Increase activity slowly otherwise.  Patient can have prednisone  as well for her trip if necessary.  Will be leaving on Saturday.  Follow-up with me again when she returns in 5 to 6 weeks.    Nonallopathic problems  Decision today to treat with OMT was based on Physical Exam  After verbal consent patient was treated with HVLA, ME, FPR techniques in cervical, rib, thoracic, lumbar, and sacral  areas  Patient tolerated the procedure well with improvement in symptoms  Patient given exercises, stretches and lifestyle modifications  See medications in patient instructions if given  Patient will follow up in 4-8 weeks    The above documentation has been reviewed and is accurate and complete Susan [name] Stephaie Dardis, DO  Note: This dictation was prepared with Dragon dictation along with smaller phrase technology. Any transcriptional errors that result from this process are unintentional.

## 2024-05-28 ENCOUNTER — Encounter: Payer: Self-pay | Admitting: Family Medicine

## 2024-05-28 ENCOUNTER — Ambulatory Visit (INDEPENDENT_AMBULATORY_CARE_PROVIDER_SITE_OTHER): Admitting: Family Medicine

## 2024-05-28 VITALS — BP 112/84 | HR 76 | Ht 68.0 in | Wt 219.0 lb

## 2024-05-28 DIAGNOSIS — G8929 Other chronic pain: Secondary | ICD-10-CM

## 2024-05-28 DIAGNOSIS — M9902 Segmental and somatic dysfunction of thoracic region: Secondary | ICD-10-CM

## 2024-05-28 DIAGNOSIS — M9908 Segmental and somatic dysfunction of rib cage: Secondary | ICD-10-CM

## 2024-05-28 DIAGNOSIS — M9901 Segmental and somatic dysfunction of cervical region: Secondary | ICD-10-CM

## 2024-05-28 DIAGNOSIS — M9904 Segmental and somatic dysfunction of sacral region: Secondary | ICD-10-CM | POA: Diagnosis not present

## 2024-05-28 DIAGNOSIS — M9903 Segmental and somatic dysfunction of lumbar region: Secondary | ICD-10-CM | POA: Diagnosis not present

## 2024-05-28 DIAGNOSIS — M545 Low back pain, unspecified: Secondary | ICD-10-CM

## 2024-05-28 MED ORDER — HYDROXYZINE HCL 10 MG PO TABS: 10.0000 mg | ORAL_TABLET | Freq: Three times a day (TID) | ORAL | 0 refills | Status: DC | PRN

## 2024-05-28 NOTE — Patient Instructions (Addendum)
 Hydroxyzine  prescribed 10mg  3x a day  Take 2 for plane trip See you again in 5-6 weeks

## 2024-05-28 NOTE — Assessment & Plan Note (Signed)
 Low back pain again noted.  Patient is very concerned since patient was last in her [place] trip and patient did not have the osteomyelitis.  Discussed with patient about hydroxyzine  for situational anxiety and prescribed appropriately.  Discussed which activities to do and which ones to avoid.  Increase activity slowly otherwise.  Patient can have prednisone  as well for her trip if necessary.  Will be leaving on Saturday.  Follow-up with me again when she returns in 5 to 6 weeks.

## 2024-06-18 ENCOUNTER — Other Ambulatory Visit: Payer: Self-pay | Admitting: *Deleted

## 2024-06-18 MED ORDER — DICLOFENAC SODIUM 75 MG PO TBEC: 75.0000 mg | DELAYED_RELEASE_TABLET | Freq: Two times a day (BID) | ORAL | 0 refills | Status: DC

## 2024-06-20 ENCOUNTER — Other Ambulatory Visit: Payer: Self-pay | Admitting: Family Medicine

## 2024-06-22 ENCOUNTER — Other Ambulatory Visit: Payer: Self-pay | Admitting: Physician Assistant

## 2024-06-25 ENCOUNTER — Encounter: Payer: Self-pay | Admitting: Physician Assistant

## 2024-06-25 NOTE — Telephone Encounter (Signed)
**Note De-identified  [place] Obfuscation** Please advise 

## 2024-06-26 ENCOUNTER — Other Ambulatory Visit: Payer: Self-pay | Admitting: Physician Assistant

## 2024-06-26 DIAGNOSIS — I77810 Thoracic aortic ectasia: Secondary | ICD-10-CM

## 2024-06-29 ENCOUNTER — Other Ambulatory Visit: Payer: Self-pay | Admitting: Physician Assistant

## 2024-06-29 DIAGNOSIS — I77810 Thoracic aortic ectasia: Secondary | ICD-10-CM

## 2024-07-01 NOTE — Progress Notes (Unsigned)
 Susan [name] Susan [name] [address] [place] [address] Phone: [phone] Subjective:   Susan [name] am a scribe for Dr. Claudene.   I'Susan seeing this patient by the request  of:  Job Lukes, [name]  CC: Back and neck pain follow-up  YEP:Dlagzrupcz  Susan [name] is a 48 y.o. female coming in with complaint of back and neck pain. OMT on 05/28/2024. Patient states that the back and neck is really good. It is the best I have felt in a very long time.   Medications patient has been prescribed: hydroxyzine   Taking: as needed         Reviewed prior external information including notes and imaging from previsou exam, outside providers and external EMR if available.   As well as notes that were available from care everywhere and other healthcare systems.  Past medical history, social, surgical and family history all reviewed in electronic medical record.  No pertanent information unless stated regarding to the chief complaint.   Past Medical History:  Diagnosis Date   Abnormal pap 02/2003   CIN 2-3   Basal cell carcinoma of back 07/22/2021   Elevated cholesterol    Gallstones    GERD (gastroesophageal reflux disease)    Hyperlipidemia    Migraine with aura     No Known Allergies   Review of Systems:  No headache, visual changes, nausea, vomiting, diarrhea, constipation, dizziness, abdominal pain, skin rash, fevers, chills, night sweats, weight loss, swollen lymph nodes, body aches, joint swelling, chest pain, shortness of breath, mood changes. POSITIVE muscle aches  Objective  Blood pressure 120/70, pulse 97, height 5' 8 (1.727 Susan), weight 213 lb (96.6 kg), SpO2 97%.   General: No apparent distress alert and oriented x3 mood and affect normal, dressed appropriately.  HEENT: Pupils equal, extraocular movements intact  Respiratory: Patient's speak in full sentences and does not appear short of breath  Cardiovascular: No lower extremity  edema, non tender, no erythema  Gait MSK:  Back loss of lordosis noted.  Tenderness to palpation in the paraspinal musculature.  Tightness with FABER right greater than left.  Osteopathic findings  C3 flexed rotated and side bent right C5 flexed rotated and side bent left T3 extended rotated and side bent right inhaled rib T9 extended rotated and side bent left L5 flexed rotated and side bent left Sacrum right on right       Assessment and Plan:  Low back pain Doing much better, responded extremely well to osteopathic manipulation today.  Patient was able to travel without any significant difficulty.  Very happy with these results.  Patient feels like she is gaining back some of her confidence.  Follow-up again in 6 to 8 weeks otherwise.    Nonallopathic problems  Decision today to treat with OMT was based on Physical Exam  After verbal consent patient was treated with HVLA, ME, FPR techniques in cervical, rib, thoracic, lumbar, and sacral  areas  Patient tolerated the procedure well with improvement in symptoms  Patient given exercises, stretches and lifestyle modifications  See medications in patient instructions if given  Patient will follow up in 4-8 weeks      The above documentation has been reviewed and is accurate and complete Susan [name] Susan Sariya Trickey, DO        Note: This dictation was prepared with Dragon dictation along with smaller phrase technology. Any transcriptional errors that result from this process are unintentional.

## 2024-07-03 ENCOUNTER — Encounter: Payer: Self-pay | Admitting: Family Medicine

## 2024-07-03 ENCOUNTER — Ambulatory Visit (INDEPENDENT_AMBULATORY_CARE_PROVIDER_SITE_OTHER): Admitting: Family Medicine

## 2024-07-03 VITALS — BP 120/70 | HR 97 | Ht 68.0 in | Wt 213.0 lb

## 2024-07-03 DIAGNOSIS — M9904 Segmental and somatic dysfunction of sacral region: Secondary | ICD-10-CM | POA: Diagnosis not present

## 2024-07-03 DIAGNOSIS — G8929 Other chronic pain: Secondary | ICD-10-CM

## 2024-07-03 DIAGNOSIS — M9903 Segmental and somatic dysfunction of lumbar region: Secondary | ICD-10-CM

## 2024-07-03 DIAGNOSIS — M545 Low back pain, unspecified: Secondary | ICD-10-CM

## 2024-07-03 DIAGNOSIS — M9901 Segmental and somatic dysfunction of cervical region: Secondary | ICD-10-CM | POA: Diagnosis not present

## 2024-07-03 DIAGNOSIS — M9908 Segmental and somatic dysfunction of rib cage: Secondary | ICD-10-CM | POA: Diagnosis not present

## 2024-07-03 DIAGNOSIS — M9902 Segmental and somatic dysfunction of thoracic region: Secondary | ICD-10-CM

## 2024-07-03 NOTE — Patient Instructions (Addendum)
 Happy travel went great Keep it up in gym See me in 2 months

## 2024-07-03 NOTE — Assessment & Plan Note (Signed)
 Doing much better, responded extremely well to osteopathic manipulation today.  Patient was able to travel without any significant difficulty.  Very happy with these results.  Patient feels like she is gaining back some of her confidence.  Follow-up again in 6 to 8 weeks otherwise.

## 2024-07-11 ENCOUNTER — Ambulatory Visit
Admission: RE | Admit: 2024-07-11 | Discharge: 2024-07-11 | Disposition: A | Discharge status: Home / Self Care | Source: Ambulatory Visit | Attending: Physician Assistant | Admitting: Physician Assistant

## 2024-07-11 DIAGNOSIS — I77810 Thoracic aortic ectasia: Secondary | ICD-10-CM

## 2024-07-11 DIAGNOSIS — I712 Thoracic aortic aneurysm, without rupture, unspecified: Secondary | ICD-10-CM | POA: Diagnosis not present

## 2024-07-11 MED ORDER — IOPAMIDOL (ISOVUE-370) INJECTION 76%
75.0000 mL | Freq: Once | INTRAVENOUS | Status: AC | PRN
  Administered 2024-07-11: 75 mL via INTRAVENOUS

## 2024-07-13 ENCOUNTER — Ambulatory Visit: Payer: Self-pay | Admitting: Physician Assistant

## 2024-07-20 ENCOUNTER — Other Ambulatory Visit: Payer: Self-pay | Admitting: Physician Assistant

## 2024-07-30 DIAGNOSIS — E785 Hyperlipidemia, unspecified: Secondary | ICD-10-CM | POA: Insufficient documentation

## 2024-07-30 NOTE — Progress Notes (Signed)
  Cardiology Office Note:   Date:  07/31/2024  ID:  Susan [name], DOB September 19, 1975, MRN [id number] PCP: Job Lukes, [place]  [facility] Providers Cardiologist:  Lynwood Schilling, MD {  History of Present Illness:   Susan [name] is a 48 y.o. female who I saw last in 2023.  She had an aorta that was 41 mm.   She has a bicuspid AoV that was evaluated with TEE in  Sept 2024.  There was no stenosis and the aorta was 38 mm.  This was done at the time of hospitalization for MSSA and L5, S1 osteo.  There was no evidence of SBE.     CTA of the chest demonstrated a 41 mm aorta in October 2025.     She has not had any cardiovascular complaints.  The patient denies any new symptoms such as chest discomfort, neck or arm discomfort. There has been no new shortness of breath, PND or orthopnea. There have been no reported palpitations, presyncope or syncope.   She got the infection after falling while hiking in [place] apparently and having a fracture of her coccyx.  She has not really had any limitations from this and she is back in the gym.  She denies any cardiovascular symptoms.  ROS: As stated in the HPI and negative for all other systems.  Studies Reviewed:    EKG:   EKG Interpretation Date/Time:  Thursday July 31 2024 09:10:04 EST Ventricular Rate:  67 PR Interval:  158 QRS Duration:  84 QT Interval:  400 QTC Calculation: 422 R Axis:   69  Text Interpretation: Normal sinus rhythm Poor anterior R wave progression When compared with ECG of 01-Sep-2019 18:00, No significant change was found Confirmed by Schilling Lynwood ([id number]) on 07/31/2024 9:28:05 AM   Risk Assessment/Calculations:              Physical Exam:   VS:  BP 121/80   Pulse 62   Ht 5' 8 (1.727 m)   Wt 217 lb 9.6 oz (98.7 kg)   SpO2 99%   BMI 33.09 kg/m    Wt Readings from Last 3 Encounters:  07/31/24 217 lb 9.6 oz (98.7 kg)  07/03/24 213 lb (96.6 kg)  05/28/24 219 lb (99.3 kg)     GEN: Well  nourished, well developed in no acute distress NECK: No JVD; No carotid bruits CARDIAC: RRR, no murmurs, rubs, gallops RESPIRATORY:  Clear to auscultation without rales, wheezing or rhonchi  ABDOMEN: Soft, non-tender, non-distended EXTREMITIES:  No edema; No deformity   ASSESSMENT AND PLAN:   Ascending aortic dilatation:   This was 41 mm on CT last month.  I would follow this in two years with repeat CT.  We will schedule this.   Dyslipidemia: LDL on pravastatin  was only 58.  I would like to switch her to Crestor 20 and repeat the profile in 3 months.   Bicuspid aortic valve: She still has no murmur.  She has minimal gradient.  I am gena check this with an echocardiogram again in 2 years and see her after that.     Follow up with me in 2 years.  Signed, Lynwood Schilling, MD

## 2024-07-31 ENCOUNTER — Encounter: Payer: Self-pay | Admitting: Cardiology

## 2024-07-31 ENCOUNTER — Other Ambulatory Visit (HOSPITAL_COMMUNITY): Payer: Self-pay

## 2024-07-31 ENCOUNTER — Ambulatory Visit: Attending: Cardiology | Admitting: Cardiology

## 2024-07-31 VITALS — BP 121/80 | HR 62 | Ht 68.0 in | Wt 217.6 lb

## 2024-07-31 DIAGNOSIS — E785 Hyperlipidemia, unspecified: Secondary | ICD-10-CM | POA: Diagnosis not present

## 2024-07-31 DIAGNOSIS — Q2381 Bicuspid aortic valve: Secondary | ICD-10-CM

## 2024-07-31 DIAGNOSIS — I77810 Thoracic aortic ectasia: Secondary | ICD-10-CM

## 2024-07-31 MED ORDER — ROSUVASTATIN CALCIUM 20 MG PO TABS
20.0000 mg | ORAL_TABLET | Freq: Every day | ORAL | 3 refills | Status: AC
  Filled 2024-07-31: qty 90, 90d supply, fill #0

## 2024-07-31 NOTE — Patient Instructions (Signed)
 Medication Instructions:  Stop pravastatin  Start Crestor 20 mg once daily *If you need a refill on your cardiac medications before your next appointment, please call your pharmacy*  Lab Work: Fasting lipid panel in 3 months (Feb 13th) If you have labs (blood work) drawn today and your tests are completely normal, you will receive your results only by: MyChart Message (if you have MyChart) OR A paper copy in the mail If you have any lab test that is abnormal or we need to change your treatment, we will call you to review the results.  Testing/Procedures: CT Aorta in 2 years  Echocardiogram in 2 years Your physician has requested that you have an echocardiogram. Echocardiography is a painless test that uses sound waves to create images of your heart. It provides your doctor with information about the size and shape of your heart and how well your heart's chambers and valves are working. This procedure takes approximately one hour. There are no restrictions for this procedure. Please do NOT wear cologne, perfume, aftershave, or lotions (deodorant is allowed). Please arrive 15 minutes prior to your appointment time.  Please note: We ask at that you not bring children with you during ultrasound (echo/ vascular) testing. Due to room size and safety concerns, children are not allowed in the ultrasound rooms during exams. Our front office staff cannot provide observation of children in our lobby area while testing is being conducted. An adult accompanying a patient to their appointment will only be allowed in the ultrasound room at the discretion of the ultrasound technician under special circumstances. We apologize for any inconvenience.   Follow-Up: At [facility], you and your health needs are our priority.  As part of our continuing mission to provide you with exceptional heart care, our providers are all part of one team.  This team includes your primary Cardiologist (physician) and  Advanced Practice Providers or APPs (Physician Assistants and Nurse Practitioners) who all work together to provide you with the care you need, when you need it.  Your next appointment:   2 year(s)  Provider:   [place], MD  We recommend signing up for the patient portal called MyChart.  Sign up information is provided on this After Visit Summary.  MyChart is used to connect with patients for Virtual Visits (Telemedicine).  Patients are able to view lab/test results, encounter notes, upcoming appointments, etc.  Non-urgent messages can be sent to your provider as well.   To learn more about what you can do with MyChart, go to [email or web address].

## 2024-08-20 ENCOUNTER — Ambulatory Visit: Payer: Self-pay | Admitting: Physician Assistant

## 2024-08-20 ENCOUNTER — Encounter: Payer: Self-pay | Admitting: Physician Assistant

## 2024-08-20 ENCOUNTER — Ambulatory Visit: Admitting: Physician Assistant

## 2024-08-20 VITALS — BP 126/82 | HR 65 | Temp 98.0°F | Ht 68.0 in | Wt 217.2 lb

## 2024-08-20 DIAGNOSIS — E7800 Pure hypercholesterolemia, unspecified: Secondary | ICD-10-CM | POA: Diagnosis not present

## 2024-08-20 DIAGNOSIS — E669 Obesity, unspecified: Secondary | ICD-10-CM

## 2024-08-20 DIAGNOSIS — F411 Generalized anxiety disorder: Secondary | ICD-10-CM

## 2024-08-20 DIAGNOSIS — K219 Gastro-esophageal reflux disease without esophagitis: Secondary | ICD-10-CM

## 2024-08-20 DIAGNOSIS — Z0000 Encounter for general adult medical examination without abnormal findings: Secondary | ICD-10-CM

## 2024-08-20 DIAGNOSIS — N951 Menopausal and female climacteric states: Secondary | ICD-10-CM

## 2024-08-20 LAB — COMPREHENSIVE METABOLIC PANEL WITH GFR
ALT: 35 U/L (ref 0–35)
AST: 28 U/L (ref 0–37)
Albumin: 4.6 g/dL (ref 3.5–5.2)
Alkaline Phosphatase: 94 U/L (ref 39–117)
BUN: 14 mg/dL (ref 6–23)
CO2: 28 meq/L (ref 19–32)
Calcium: 9.6 mg/dL (ref 8.4–10.5)
Chloride: 103 meq/L (ref 96–112)
Creatinine, Ser: 0.86 mg/dL (ref 0.40–1.20)
GFR: 79.3 mL/min (ref >60.00)
Glucose, Bld: 96 mg/dL (ref 70–99)
Potassium: 3.6 meq/L (ref 3.5–5.1)
Sodium: 139 meq/L (ref 135–145)
Total Bilirubin: 0.4 mg/dL (ref 0.2–1.2)
Total Protein: 7.3 g/dL (ref 6.0–8.3)

## 2024-08-20 LAB — CBC WITH DIFFERENTIAL/PLATELET
Basophils Absolute: 0 K/uL (ref 0.0–0.1)
Basophils Relative: 0.6 % (ref 0.0–3.0)
Eosinophils Absolute: 0.1 K/uL (ref 0.0–0.7)
Eosinophils Relative: 1 % (ref 0.0–5.0)
HCT: 37.8 % (ref 36.0–46.0)
Hemoglobin: 12.5 g/dL (ref 12.0–15.0)
Lymphocytes Relative: 28.8 % (ref 12.0–46.0)
Lymphs Abs: 2.3 K/uL (ref 0.7–4.0)
MCHC: 33.1 g/dL (ref 30.0–36.0)
MCV: 84.7 fl (ref 78.0–100.0)
Monocytes Absolute: 0.4 K/uL (ref 0.1–1.0)
Monocytes Relative: 5.4 % (ref 3.0–12.0)
Neutro Abs: 5.2 K/uL (ref 1.4–7.7)
Neutrophils Relative %: 64.2 % (ref 43.0–77.0)
Platelets: 314 K/uL (ref 150.0–400.0)
RBC: 4.46 Mil/uL (ref 3.87–5.11)
RDW: 16.4 % — ABNORMAL HIGH (ref 11.5–15.5)
WBC: 8.1 K/uL (ref 4.0–10.5)
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
## 2024-09-01 ENCOUNTER — Encounter: Admitting: Physician Assistant

## 2024-09-01 NOTE — Progress Notes (Unsigned)
 [name] [address] [place] [address] Phone: [phone] Subjective:   Susan [name] am a scribe for Dr. Claudene.   I'm seeing this patient by the request  of:  Job Lukes, [name]  CC: Back and neck pain follow-up  YEP:Dlagzrupcz  Susan [name] is a 48 y.o. female coming in with complaint of back and neck pain. OMT on 07/03/2024. Patient states that the back and neck are real good today.  Patient has not had a note has been an exacerbation of the status of his activity at the moment.  Medications patient has been prescribed: hydroxyzine   Taking: yes as needed       Reviewed prior external information including notes and imaging from previsou exam, outside providers and external EMR if available.   As well as notes that were available from care everywhere and other healthcare systems.  Past medical history, social, surgical and family history all reviewed in electronic medical record.  No pertanent information unless stated regarding to the chief complaint.   Past Medical History:  Diagnosis Date   Abnormal pap 02/2003   CIN 2-3   Anxiety    Basal cell carcinoma of back 07/22/2021   Elevated cholesterol    Gallstones    GERD (gastroesophageal reflux disease)    Hyperlipidemia    Migraine with aura     Allergies[1]   Review of Systems:  No headache, visual changes, nausea, vomiting, diarrhea, constipation, dizziness, abdominal pain, skin rash, fevers, chills, night sweats, weight loss, swollen lymph nodes, body aches, joint swelling, chest pain, shortness of breath, mood changes. POSITIVE muscle aches  Objective  Blood pressure 100/60, pulse 79, height 5' 8 (1.727 m), weight 214 lb (97.1 kg), last menstrual period 07/21/2024, SpO2 95%.   General: No apparent distress alert and oriented x3 mood and affect normal, dressed appropriately.  HEENT: Pupils equal, extraocular movements intact  Respiratory: Patient's  speak in full sentences and does not appear short of breath  Cardiovascular: No lower extremity edema, non tender, no erythema  Gait MSK:  Back does have tightness noted.  Mild in the lower back.  Seems to be more in the cervical thoracic area right greater than left.  Tightness on the right side of the parascapular area as well noted.  Osteopathic findings  C2 flexed rotated and side bent right C6 flexed rotated and side bent left T3 extended rotated and side bent right inhaled rib T9 extended rotated and side bent left L2 flexed rotated and side bent right Sacrum right on right     Assessment and Plan:  Low back pain Significant improvement at this time.  Discussed icing regimen and home exercises, increase activity slowly. Has done well at this time follow-up again in 6 to 8 weeks     Nonallopathic problems  Decision today to treat with OMT was based on Physical Exam  After verbal consent patient was treated with HVLA, ME, FPR techniques in cervical, rib, thoracic, lumbar, and sacral  areas  Patient tolerated the procedure well with improvement in symptoms  Patient given exercises, stretches and lifestyle modifications  See medications in patient instructions if given  Patient will follow up in 4-8 weeks     The above documentation has been reviewed and is accurate and complete Song Garris M Christan Ciccarelli, DO         Note: This dictation was prepared with Dragon dictation along with smaller phrase technology. Any transcriptional errors that result from this  process are unintentional.            [1] No Known Allergies

## 2024-09-02 ENCOUNTER — Encounter: Payer: Self-pay | Admitting: Family Medicine

## 2024-09-02 ENCOUNTER — Ambulatory Visit: Admitting: Family Medicine

## 2024-09-02 ENCOUNTER — Other Ambulatory Visit (HOSPITAL_BASED_OUTPATIENT_CLINIC_OR_DEPARTMENT_OTHER): Payer: Self-pay | Admitting: Physician Assistant

## 2024-09-02 ENCOUNTER — Inpatient Hospital Stay (HOSPITAL_BASED_OUTPATIENT_CLINIC_OR_DEPARTMENT_OTHER): Admission: RE | Admit: 2024-09-02 | Discharge: 2024-09-02

## 2024-09-02 ENCOUNTER — Encounter (HOSPITAL_BASED_OUTPATIENT_CLINIC_OR_DEPARTMENT_OTHER): Payer: Self-pay | Admitting: Radiology

## 2024-09-02 VITALS — BP 100/60 | HR 79 | Ht 68.0 in | Wt 214.0 lb

## 2024-09-02 DIAGNOSIS — M9902 Segmental and somatic dysfunction of thoracic region: Secondary | ICD-10-CM | POA: Diagnosis not present

## 2024-09-02 DIAGNOSIS — M9903 Segmental and somatic dysfunction of lumbar region: Secondary | ICD-10-CM

## 2024-09-02 DIAGNOSIS — Z1231 Encounter for screening mammogram for malignant neoplasm of breast: Secondary | ICD-10-CM | POA: Insufficient documentation

## 2024-09-02 DIAGNOSIS — G8929 Other chronic pain: Secondary | ICD-10-CM

## 2024-09-02 DIAGNOSIS — M545 Low back pain, unspecified: Secondary | ICD-10-CM

## 2024-09-02 DIAGNOSIS — M9908 Segmental and somatic dysfunction of rib cage: Secondary | ICD-10-CM

## 2024-09-02 DIAGNOSIS — M9901 Segmental and somatic dysfunction of cervical region: Secondary | ICD-10-CM | POA: Diagnosis not present

## 2024-09-02 DIAGNOSIS — M9904 Segmental and somatic dysfunction of sacral region: Secondary | ICD-10-CM

## 2024-09-02 NOTE — Assessment & Plan Note (Signed)
 Significant improvement at this time.  Discussed icing regimen and home exercises, increase activity slowly. Has done well at this time follow-up again in 6 to 8 weeks

## 2024-09-02 NOTE — Patient Instructions (Addendum)
 Good to see you. So glad that you are doing so well.  See me again in 10 to 11 weeks.

## 2024-09-16 ENCOUNTER — Ambulatory Visit (HOSPITAL_COMMUNITY)

## 2024-09-18 ENCOUNTER — Other Ambulatory Visit: Payer: Self-pay | Admitting: Physician Assistant

## 2024-09-29 ENCOUNTER — Other Ambulatory Visit (HOSPITAL_COMMUNITY): Payer: Self-pay

## 2024-10-22 ENCOUNTER — Encounter (HOSPITAL_BASED_OUTPATIENT_CLINIC_OR_DEPARTMENT_OTHER): Payer: Self-pay | Admitting: Emergency Medicine

## 2024-10-22 ENCOUNTER — Emergency Department (HOSPITAL_BASED_OUTPATIENT_CLINIC_OR_DEPARTMENT_OTHER): Admitting: Radiology

## 2024-10-22 ENCOUNTER — Emergency Department (HOSPITAL_BASED_OUTPATIENT_CLINIC_OR_DEPARTMENT_OTHER)

## 2024-10-22 ENCOUNTER — Ambulatory Visit: Payer: Self-pay | Admitting: *Deleted

## 2024-10-22 ENCOUNTER — Other Ambulatory Visit: Payer: Self-pay

## 2024-10-22 ENCOUNTER — Emergency Department (HOSPITAL_BASED_OUTPATIENT_CLINIC_OR_DEPARTMENT_OTHER)
Admission: EM | Admit: 2024-10-22 | Discharge: 2024-10-22 | Disposition: A | Discharge status: Home / Self Care | Attending: Emergency Medicine | Admitting: Emergency Medicine

## 2024-10-22 DIAGNOSIS — I712 Thoracic aortic aneurysm, without rupture, unspecified: Secondary | ICD-10-CM | POA: Insufficient documentation

## 2024-10-22 DIAGNOSIS — D72829 Elevated white blood cell count, unspecified: Secondary | ICD-10-CM | POA: Insufficient documentation

## 2024-10-22 DIAGNOSIS — K219 Gastro-esophageal reflux disease without esophagitis: Secondary | ICD-10-CM | POA: Insufficient documentation

## 2024-10-22 LAB — COMPREHENSIVE METABOLIC PANEL WITH GFR
ALT: 52 U/L — ABNORMAL HIGH (ref 0–44)
AST: 31 U/L (ref 15–41)
Albumin: 4.4 g/dL (ref 3.5–5.0)
Alkaline Phosphatase: 122 U/L (ref 38–126)
Anion gap: 11 (ref 5–15)
BUN: 10 mg/dL (ref 6–20)
CO2: 26 mmol/L (ref 22–32)
Calcium: 9.8 mg/dL (ref 8.9–10.3)
Chloride: 104 mmol/L (ref 98–111)
Creatinine, Ser: 1.03 mg/dL — ABNORMAL HIGH (ref 0.44–1.00)
GFR, Estimated: >60 mL/min
Glucose, Bld: 89 mg/dL (ref 70–99)
Potassium: 3.5 mmol/L (ref 3.5–5.1)
Sodium: 141 mmol/L (ref 135–145)
Total Bilirubin: 0.4 mg/dL (ref 0.0–1.2)
Total Protein: 7 g/dL (ref 6.5–8.1)

## 2024-10-22 LAB — CBC
HCT: 37.8 % (ref 36.0–46.0)
Hemoglobin: 12.8 g/dL (ref 12.0–15.0)
MCH: 29.4 pg (ref 26.0–34.0)
MCHC: 33.9 g/dL (ref 30.0–36.0)
MCV: 86.9 fL (ref 80.0–100.0)
Platelets: 330 10*3/uL (ref 150–400)
RBC: 4.35 MIL/uL (ref 3.87–5.11)
RDW: 14.7 % (ref 11.5–15.5)
WBC: 14.6 10*3/uL — ABNORMAL HIGH (ref 4.0–10.5)
nRBC: 0 % (ref 0.0–0.2)

## 2024-10-22 LAB — TROPONIN T, HIGH SENSITIVITY
Troponin T High Sensitivity: <6 ng/L (ref 0–19)
Troponin T High Sensitivity: <6 ng/L (ref 0–19)

## 2024-10-22 LAB — HCG, SERUM, QUALITATIVE: Preg, Serum: NEGATIVE

## 2024-10-22 MED ORDER — IOHEXOL 350 MG/ML SOLN
100.0000 mL | Freq: Once | INTRAVENOUS | Status: AC | PRN
  Administered 2024-10-22: 758 mL via INTRAVENOUS

## 2024-10-22 MED ORDER — FAMOTIDINE 20 MG PO TABS: 20.0000 mg | ORAL_TABLET | Freq: Two times a day (BID) | ORAL | 0 refills | Status: AC

## 2024-10-22 NOTE — ED Triage Notes (Signed)
 Central chest tightness starting approx 1 hour ago. States feels similar to pna in past.

## 2024-10-22 NOTE — ED Provider Notes (Signed)
 " [place] EMERGENCY DEPARTMENT AT [facility] Provider Note   CSN: [id number] Arrival date & time: 10/22/24  1224     Patient presents with: Chest Pain   Susan [name] is a 49 y.o. female.    Chest Pain Associated symptoms: cough and shortness of breath      49 year old female medical history significant for GERD, thoracic AAA, HLD, gallstones s/p cholecystectomy, presenting to the emergency department with chest pain, cough and shortness of breath.  The patient states that last night I had the most severe attack of GERD I have ever had with burning discomfort in her chest following which she took antacids and ultimately was able to get to sleep.  This morning while at work she developed sudden onset chest tightness with associated dry cough and shortness of breath.  She denies any lower extremity swelling or cramping.  She denies any history of DVT or PE.  Denies any sputum production.  No fevers or chills. No ripping or tearing chest pain and no back pain.   Prior to Admission medications  Medication Sig Start Date End Date Taking? Authorizing Provider  famotidine  (PEPCID ) 20 MG tablet Take 1 tablet (20 mg total) by mouth 2 (two) times daily. 10/22/24  Yes Jerrol Agent, MD  diclofenac  (VOLTAREN ) 75 MG EC tablet TAKE 1 TABLET BY MOUTH TWICE A DAY 07/21/24   Job, Mack, PA  hydrOXYzine  (ATARAX ) 10 MG tablet TAKE 1 TABLET BY MOUTH THREE TIMES A DAY AS NEEDED 06/23/24   Claudene Arthea HERO, DO  pantoprazole  (PROTONIX ) 40 MG tablet TAKE 1 TABLET BY MOUTH EVERY DAY 09/22/24   Job Lukes, PA  rosuvastatin  (CRESTOR ) 20 MG tablet Take 1 tablet (20 mg total) by mouth daily. 07/31/24   Lavona Agent, MD  SEMAGLUTIDE [place] Inject into the skin. compounded    [provider]  traZODone  (DESYREL ) 100 MG tablet TAKE 1 TABLET BY MOUTH EVERYDAY AT BEDTIME 09/22/24   Job Lukes, PA    Allergies: Patient has no known allergies.    Review of Systems  Respiratory:   Positive for cough and shortness of breath.   Cardiovascular:  Positive for chest pain.    Updated Vital Signs BP 108/84   Pulse 64   Temp 98 F (36.7 C)   Resp 18   SpO2 97%   Physical Exam Vitals and nursing note reviewed.  Constitutional:      General: She is not in acute distress.    Appearance: She is well-developed. She is obese.  HENT:     Head: Normocephalic and atraumatic.  Eyes:     Conjunctiva/sclera: Conjunctivae normal.  Cardiovascular:     Rate and Rhythm: Normal rate and regular rhythm.     Heart sounds: No murmur heard. Pulmonary:     Effort: Pulmonary effort is normal. No respiratory distress.     Breath sounds: Normal breath sounds.  Abdominal:     Palpations: Abdomen is soft.     Tenderness: There is no abdominal tenderness.  Musculoskeletal:        General: No swelling.     Cervical back: Neck supple.  Skin:    General: Skin is warm and dry.     Capillary Refill: Capillary refill takes less than 2 seconds.  Neurological:     Mental Status: She is alert.  Psychiatric:        Mood and Affect: Mood normal.     (all labs ordered are listed, but only abnormal results are displayed)  Labs Reviewed  CBC - Abnormal; Notable for the following components:      Result Value   WBC 14.6 (*)    All other components within normal limits  COMPREHENSIVE METABOLIC PANEL WITH GFR - Abnormal; Notable for the following components:   Creatinine, Ser 1.03 (*)    ALT 52 (*)    All other components within normal limits  HCG, SERUM, QUALITATIVE  TROPONIN T, HIGH SENSITIVITY  TROPONIN T, HIGH SENSITIVITY    EKG: EKG Interpretation Date/Time:  Wednesday October 22 2024 12:35:43 EST Ventricular Rate:  70 PR Interval:  164 QRS Duration:  99 QT Interval:  408 QTC Calculation: 441 R Axis:   65  Text Interpretation: Sinus rhythm Low voltage, precordial leads No significant change since last tracing Confirmed by Jerrol Agent ([id number]) on 10/22/2024 12:47:19  PM  Radiology: CT Angio Chest PE W and/or Wo Contrast Result Date: 10/22/2024 CLINICAL DATA:  Chest tightness EXAM: CT ANGIOGRAPHY CHEST WITH CONTRAST TECHNIQUE: Multidetector CT imaging of the chest was performed using the standard protocol during bolus administration of intravenous contrast. Multiplanar CT image reconstructions and MIPs were obtained to evaluate the vascular anatomy. RADIATION DOSE REDUCTION: This exam was performed according to the departmental dose-optimization program which includes automated exposure control, adjustment of the mA and/or kV according to patient size and/or use of iterative reconstruction technique. CONTRAST:  OMNIPAQUE  IOHEXOL  350 MG/ML SOLN COMPARISON:  July 11, 2024 FINDINGS: Cardiovascular: Satisfactory opacification of the pulmonary arteries to the segmental level. No evidence of pulmonary embolism. Normal heart size. No pericardial effusion. Grossly stable 4.1 cm ascending thoracic aortic aneurysm. Mediastinum/Nodes: No enlarged mediastinal, hilar, or axillary lymph nodes. Thyroid gland, trachea, and esophagus demonstrate no significant findings. Lungs/Pleura: Lungs are clear. No pleural effusion or pneumothorax. Upper Abdomen: No acute abnormality. Musculoskeletal: No chest wall abnormality. No acute or significant osseous findings. Review of the MIP images confirms the above findings. IMPRESSION: No definite evidence of pulmonary embolus. Stable 4.1 cm ascending thoracic aortic aneurysm. Recommend annual imaging followup by CTA or MRA. This recommendation follows 2010 ACCF/AHA/AATS/ACR/ASA/SCA/SCAI/SIR/STS/SVM Guidelines for the Diagnosis and Management of Patients with Thoracic Aortic Disease. Circulation. 2010; 121: Z733-z630. Aortic aneurysm NOS (ICD10-I71.9). Electronically Signed   By: Agent Landy Raddle M.D.   On: 10/22/2024 14:52   DG Chest 2 View Result Date: 10/22/2024 CLINICAL DATA:  Chest pain EXAM: CHEST - 2 VIEW COMPARISON:  09/01/2019, 07/11/2024  FINDINGS: The heart size and mediastinal contours are within normal limits. Both lungs are clear. The visualized skeletal structures are unremarkable. IMPRESSION: No active cardiopulmonary disease. Electronically Signed   By: CHRISTELLA.  Shick M.D.   On: 10/22/2024 13:16     Procedures   Medications Ordered in the ED  iohexol  (OMNIPAQUE ) 350 MG/ML injection 100 mL (758 mLs Intravenous Contrast Given 10/22/24 1427)                                    Medical Decision Making Amount and/or Complexity of Data Reviewed Labs: ordered. Radiology: ordered.  Risk Prescription drug management.   49 year old female medical history significant for GERD, thoracic AAA, HLD, gallstones s/p cholecystectomy, presenting to the emergency department with chest pain, cough and shortness of breath.  The patient states that last night I had the most severe attack of GERD I have ever had with burning discomfort in her chest following which she took antacids and ultimately was able to get to  sleep.  This morning while at work she developed sudden onset chest tightness with associated dry cough and shortness of breath.  She denies any lower extremity swelling or cramping.  She denies any history of DVT or PE.  Denies any sputum production.  No fevers or chills. No ripping or tearing chest pain and no back pain.   On arrival, the patient was vitally stable, afebrile, not tachycardic or tachypneic, BP 125/93, saturating 100% on room air.  Patient presenting with cough, chest tightness, shortness of breath.  Differential diagnose includes pulm embolism, viral infectious etiology, ACS, pneumothorax, pneumonia, also considered GERD and esophageal spasm in the setting of symptoms last night.  Patient without ripping tearing chest pain radiating to the back, low concern for aortic dissection.  EKG: Sinus rhythm, ventricular rate 7 0, no acute ischemic changes  Chest x-ray: No active cardiopulmonary disease  Labs: CBC with a  nonspecific leukocytosis to 14.6, no anemia hemoglobin 12.8, CMP generally unremarkable, initial cardiac troponin less than 6, hCG and repeat troponin collected and resulted normal..   In the setting of the patient's sudden onset chest pain, shortness of breath and dry cough, will obtain CTA PE study to rule out PE.  CTA PE:  IMPRESSION:  No definite evidence of pulmonary embolus.    Stable 4.1 cm ascending thoracic aortic aneurysm. Recommend annual  imaging followup by CTA or MRA. This recommendation follows 2010  ACCF/AHA/AATS/ACR/ASA/SCA/SCAI/SIR/STS/SVM Guidelines for the  Diagnosis and Management of Patients with Thoracic Aortic Disease.  Circulation. 2010; 121: [id number]. Aortic aneurysm NOS  (ICD10-I71.9).    I reviewed the patient's CT and contrast timing shows the pulmonary artery as well but does not show the aorta.  Patient has no symptoms of aortic dissection at this time, no ripping tearing chest pain, no back pain, has symmetric 2+ radial pulses.  Aneurysm has not grown in size and is stable at 4.1 cm. Low concern for aortic dissection at this time.  Discussed with the patient bedside repeat CTA to evaluate her aorta.  She states that her pain is completely resolved at this point and she is comfortable with the plan for discharge.   Will discharge on a course of Pepcid , advised to follow-up with her primary care provider for continued outpatient management.  Return precautions provided.     Final diagnoses:  Gastroesophageal reflux disease, unspecified whether esophagitis present  Thoracic aortic aneurysm without rupture, unspecified part    ED Discharge Orders          Ordered    famotidine  (PEPCID ) 20 MG tablet  2 times daily        10/22/24 1519               Jerrol Agent, MD 10/22/24 1520  "

## 2024-10-22 NOTE — Telephone Encounter (Signed)
 Recommended calling 911 due to chest tightness and elevated BP. Unsure patient will call 911 but did report she will go to ED. CAL notified.    FYI Only or Action Required?: FYI only for provider: ED advised.  Patient was last seen in primary care on 08/20/2024 by Job Lukes, PA.  Called Nurse Triage reporting Chest Pain.  Symptoms began yesterday.  Interventions attempted: OTC medications: antacid.  Symptoms are: rapidly worsening.  Triage Disposition: Call EMS 911 Now  Patient/caregiver understands and will follow disposition?: Unsure                 Reason for Disposition  Sounds like a life-threatening emergency to the triager  Answer Assessment - Initial Assessment Questions Recommended ED now and to call 911 for assist. Offered to call 911 for patient. Patient reports chest tightness and difficulty breathing in and pain breathing in. Due to elevated BP 138/100 recommended 911 . Patient currently at work and reports she can get her husband to come and get her.  Again recommended 911.        1. LOCATION: Where does it hurt?       chest 2. RADIATION: Does the pain go anywhere else? (e.g., into neck, jaw, arms, back)     no 3. ONSET: When did the chest pain begin? (Minutes, hours or days)      Last night thought it was bad case of heartburn 4. PATTERN: Does the pain come and go, or has it been constant since it started?  Does it get worse with exertion?      Pain last night and started back again today lasting more than 5 minutes 5. DURATION: How long does it last (e.g., seconds, minutes, hours)     More than 5 minutes 6. SEVERITY: How bad is the pain?  (e.g., Scale 1-10; mild, moderate, or severe)     Worsening tightness 7. CARDIAC RISK FACTORS: Do you have any history of heart problems or risk factors for heart disease? (e.g., angina, prior heart attack; diabetes, high blood pressure, high cholesterol, smoker, or strong family  history of heart disease)     No  8. PULMONARY RISK FACTORS: Do you have any history of lung disease?  (e.g., blood clots in lung, asthma, emphysema, birth control pills)     No  9. CAUSE: What do you think is causing the chest pain?     heartburn 10. OTHER SYMPTOMS: Do you have any other symptoms? (e.g., dizziness, nausea, vomiting, sweating, fever, difficulty breathing, cough)       Chest tightness, difficulty taking deep breath, lightheaded. BP 138/100.  Protocols used: Chest Pain-A-AH

## 2024-10-22 NOTE — Discharge Instructions (Addendum)
 Your EKG, CT imaging and laboratory evaluation was overall reassuring to include normal cardiac troponins x 2.  Recommend you follow-up outpatient with your primary care provider.  Episode has been prescribed in addition to your home Protonix , can try over-the-counter Maalox as well for GERD symptoms.  There is no evidence that you are having a heart attack today and there was no evidence of blood clot on your CT imaging and your thoracic aneurysm was stable compared to prior CTs.

## 2024-10-22 NOTE — Telephone Encounter (Signed)
 Spoke to pt told her calling about Triage message. Told her if you are having chest pain and worsening need to go to ED now. Pt said she is just getting here now at [facility]. Told her okay, good.

## 2024-10-23 ENCOUNTER — Telehealth: Payer: Self-pay

## 2024-10-23 NOTE — Telephone Encounter (Signed)
 Transition Care Management Follow-up Telephone Call Date of discharge and from where: 10/25/2024- [facility] How have you been since you were released from the hospital? NO Any questions or concerns? No  Items Reviewed: Did the pt receive and understand the discharge instructions provided? Yes  Medications obtained and verified? Yes  Any new allergies since your discharge? No  Dietary orders reviewed? Yes Do you have support at home? Yes   Home Care and Equipment/Supplies: Were home health services ordered? not applicable Has the agency set up a time to come to the patient's home? not applicable Were any new equipment or medical supplies ordered?  No Were you able to get the supplies/equipment? not applicable Do you have any questions related to the use of the equipment or supplies? No  Functional Questionnaire: (I = Independent and D = Dependent) ADLs: I  Bathing/Dressing- I  Meal Prep- I  Eating- I  Maintaining continence- I  Transferring/Ambulation- I  Managing Meds- I  Follow up appointments reviewed:  PCP Hospital f/u appt confirmed? Yes  Scheduled to see Job Lukes PA- on 10/24/2024 @ 1:40pm. Specialist Hospital f/u appt confirmed? No   Are transportation arrangements needed? No  If their condition worsens, is the pt aware to call PCP or go to the Emergency Dept.? Yes Was the patient provided with contact information for the PCP's office or ED? Yes Was to pt encouraged to call back with questions or concerns? Yes

## 2024-10-24 ENCOUNTER — Ambulatory Visit: Admitting: Physician Assistant

## 2024-10-24 ENCOUNTER — Encounter: Payer: Self-pay | Admitting: Physician Assistant

## 2024-10-24 VITALS — BP 110/80 | HR 75 | Temp 97.6°F | Ht 68.0 in | Wt 207.2 lb

## 2024-10-24 DIAGNOSIS — K219 Gastro-esophageal reflux disease without esophagitis: Secondary | ICD-10-CM

## 2024-10-24 DIAGNOSIS — R159 Full incontinence of feces: Secondary | ICD-10-CM

## 2024-10-24 DIAGNOSIS — E669 Obesity, unspecified: Secondary | ICD-10-CM

## 2024-10-24 MED ORDER — TIRZEPATIDE-WEIGHT MANAGEMENT 2.5 MG/0.5ML ~~LOC~~ SOLN: 2.5000 mg | SUBCUTANEOUS | 0 refills | Status: AC

## 2024-10-24 NOTE — Patient Instructions (Addendum)
" °  VISIT SUMMARY: During your visit, we discussed your severe heartburn and chest tightness, which may be related to your medication. We also addressed your weight management and a recent episode of stool incontinence.  YOUR PLAN: GASTROESOPHAGEAL REFLUX DISEASE: You have chronic GERD with severe symptoms likely due to your medication. -Start taking Pepcid  as prescribed. -Consider a GI consult for a potential endoscopy if symptoms persist.  OBESITY: Your weight management with semaglutide has been paused due to side effects. -Try the provided samples of tirzepatide  (Zepbound ) for better tolerance and efficacy. -Monitor your weight and symptoms while taking tirzepatide . -Do not take semaglutide and tirzepatide  at the same time.  STOOL INCONTINENCE AND DIARRHEA: You had a recent episode that may be related to dietary factors or medication side effects. -Monitor your bowel movements and report any further episodes.    Contains text generated by [employer].   "

## 2024-10-24 NOTE — Progress Notes (Signed)
 "  History of Present Illness:   Chief Complaint  Patient presents with   Follow-up    Pt was seen in the ED on 2/4 for GERD.    Discussed the use of AI scribe software for clinical note transcription with the patient, who gave verbal consent to proceed.  History of Present Illness   Susan [name] is a 49 year old female with a history of heartburn who presents with severe heartburn and chest tightness. She went to the ER for this on 10/23/23.  She describes this as the worst heartburn she has had. It began around 11:30 PM after eating quiche, salad, and drinking seltzer, and woke her from sleep with severe throat pain and a crackling sensation in her chest when breathing. She drank water and took antacid chews with relief and was able to return to sleep. She has long-standing heartburn that has worsened since starting semaglutide. She takes Protonix  nightly but has breakthrough symptoms.   The next day at work she developed chest tightness and difficulty taking deep breaths, which led her to go to the ER. Symptoms improved after a few hours. She was told her white blood cell count was elevated -- it was 14.6. She denies recent steroid use, fever, or other infection symptoms. The ER prescribed Pepcid  but she has not started it.  She had one episode of sudden liquid stool incontinence last week with no recurrence and no clear dietary trigger.  She has taken compounded semaglutide for weight management since September with decreased appetite but minimal weight loss. She has had no menstrual period since October. She is trying to increase her water intake to help her symptoms.        Past Medical History:  Diagnosis Date   Abnormal pap 02/2003   CIN 2-3   Anxiety    Basal cell carcinoma of back 07/22/2021   Elevated cholesterol    Gallstones    GERD (gastroesophageal reflux disease)    Hyperlipidemia    Migraine with aura      Social History[1]  Past Surgical History:   Procedure Laterality Date   BREAST BIOPSY Left 08/04/2022   US  LT BREAST BX W LOC DEV 1ST LESION IMG BX SPEC US  GUIDE 08/04/2022 [facility]   BREAST SURGERY     CERVICAL BIOPSY  W/ LOOP ELECTRODE EXCISION  02/2003   CIN 2-3   CHOLECYSTECTOMY  2015   COSMETIC SURGERY  2009   TEE WITHOUT CARDIOVERSION N/A 06/18/2023   Procedure: TRANSESOPHAGEAL ECHOCARDIOGRAM;  Surgeon: Delford Maude JAYSON, MD;  Location: [facility];  Service: Cardiovascular;  Laterality: N/A;    Family History  Problem Relation Age of Onset   HIV Mother    Other Mother    Early death Mother        Age 7   Hypertension Father    Depression Father        Suicide   Early death Brother        Age 10   Drug abuse Brother    Hypertension Maternal Grandmother    Diabetes Maternal Grandmother    Stroke Maternal Grandmother    Heart failure Maternal Grandfather    Heart disease Maternal Grandfather    Diabetes Paternal Grandmother    Depression Paternal Grandfather        Suicide   Colon cancer Neg Hx    Esophageal cancer Neg Hx    Liver disease Neg Hx     Allergies[2]  Current  Medications:  Current Medications[3]   Review of Systems:   Negative unless otherwise specified per HPI.  Vitals:   Vitals:   10/24/24 1344  BP: 110/80  Pulse: 75  Temp: 97.6 F (36.4 C)  TempSrc: Temporal  SpO2: 96%  Weight: 207 lb 4 oz (94 kg)  Height: 5' 8 (1.727 m)     Body mass index is 31.51 kg/m.  Physical Exam:   Physical Exam Vitals and nursing note reviewed.  Constitutional:      General: She is not in acute distress.    Appearance: She is well-developed. She is not ill-appearing or toxic-appearing.  Cardiovascular:     Rate and Rhythm: Normal rate and regular rhythm.     Pulses: Normal pulses.     Heart sounds: Normal heart sounds, S1 normal and S2 normal.  Pulmonary:     Effort: Pulmonary effort is normal.     Breath sounds: Normal breath sounds.  Skin:    General: Skin is warm and  dry.  Neurological:     Mental Status: She is alert.     GCS: GCS eye subscore is 4. GCS verbal subscore is 5. GCS motor subscore is 6.  Psychiatric:        Speech: Speech normal.        Behavior: Behavior normal. Behavior is cooperative.     Assessment and Plan:   Assessment and Plan    Gastroesophageal reflux disease without esophagitis  Chronic GERD with severe exacerbation, likely due to semaglutide. Symptoms include severe heartburn, throat pain, and possible acid aspiration. Current management with Protonix ; considering endoscopy due to breakthrough symptoms. - Start Pepcid  as prescribed. - Consider GI consult for potential endoscopy if symptoms persist.  Obesity Management with semaglutide paused due to side effects. Discussed switching to tirzepatide  (Zepbound ) for better tolerance and efficacy. Weight loss suboptimal at 207 lbs. - Trial tirzepatide  (Zepbound ) with provided samples. - Monitor weight and symptoms with tirzepatide . - Do not take semaglutide and tirzepatide  simultaneously. Reach out to us  if script is desired.  Incontinence of feces, unspecified fecal incontinence type  Recent episode possibly related to dietary factors or medication side effects. No fever or infection signs. - Monitor bowel movements and report further episodes.       Lucie Buttner, PA-C    [1]  Social History Tobacco Use   Smoking status: Never   Smokeless tobacco: Never  Vaping Use   Vaping status: Never Used  Substance Use Topics   Alcohol use: Yes    Alcohol/week: 1.0 standard drink of alcohol    Types: 1 Glasses of wine per week    Comment: 1-2 glasses/week   Drug use: Never  [2] No Known Allergies [3]  Current Outpatient Medications:    diclofenac  (VOLTAREN ) 75 MG EC tablet, TAKE 1 TABLET BY MOUTH TWICE A DAY, Disp: 60 tablet, Rfl: 0   famotidine  (PEPCID ) 20 MG tablet, Take 1 tablet (20 mg total) by mouth 2 (two) times daily., Disp: 30 tablet, Rfl: 0   hydrOXYzine   (ATARAX ) 10 MG tablet, TAKE 1 TABLET BY MOUTH THREE TIMES A DAY AS NEEDED, Disp: 270 tablet, Rfl: 1   pantoprazole  (PROTONIX ) 40 MG tablet, TAKE 1 TABLET BY MOUTH EVERY DAY, Disp: 90 tablet, Rfl: 1   rosuvastatin  (CRESTOR ) 20 MG tablet, Take 1 tablet (20 mg total) by mouth daily., Disp: 90 tablet, Rfl: 3   SEMAGLUTIDE [place], Inject into the skin. compounded, Disp: , Rfl:    tirzepatide  (ZEPBOUND ) 2.5 MG/0.5ML injection  vial, Inject 2.5 mg into the skin once a week., Disp: 2 mL, Rfl: 0   traZODone  (DESYREL ) 100 MG tablet, TAKE 1 TABLET BY MOUTH EVERYDAY AT BEDTIME, Disp: 90 tablet, Rfl: 1  "

## 2024-11-12 ENCOUNTER — Ambulatory Visit: Admitting: Family Medicine
